# Patient Record
Sex: Female | Born: 1995 | Race: Black or African American | Hispanic: No | Smoking: Never smoker
Health system: Southern US, Community
[De-identification: ages and names within clinical notes are randomized; demographics above are authoritative.]

## PROBLEM LIST (undated history)

## (undated) ENCOUNTER — Inpatient Hospital Stay (HOSPITAL_COMMUNITY): Payer: Self-pay

## (undated) DIAGNOSIS — I10 Essential (primary) hypertension: Secondary | ICD-10-CM

## (undated) DIAGNOSIS — Z8751 Personal history of pre-term labor: Secondary | ICD-10-CM

## (undated) DIAGNOSIS — O1410 Severe pre-eclampsia, unspecified trimester: Secondary | ICD-10-CM

## (undated) DIAGNOSIS — Z9851 Tubal ligation status: Secondary | ICD-10-CM

## (undated) DIAGNOSIS — Z98891 History of uterine scar from previous surgery: Secondary | ICD-10-CM

## (undated) DIAGNOSIS — D500 Iron deficiency anemia secondary to blood loss (chronic): Secondary | ICD-10-CM

## (undated) HISTORY — PX: HERNIA REPAIR: SHX51

## (undated) HISTORY — DX: Essential (primary) hypertension: I10

## (undated) HISTORY — PX: EYE SURGERY: SHX253

---
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
## 2013-01-21 ENCOUNTER — Ambulatory Visit: Payer: Managed Care, Other (non HMO) | Admitting: Internal Medicine

## 2013-04-06 ENCOUNTER — Ambulatory Visit (INDEPENDENT_AMBULATORY_CARE_PROVIDER_SITE_OTHER): Payer: Managed Care, Other (non HMO) | Admitting: Internal Medicine

## 2013-04-06 ENCOUNTER — Encounter: Payer: Self-pay | Admitting: Internal Medicine

## 2013-04-06 VITALS — BP 124/70 | HR 69 | Temp 98.0°F | Resp 16 | Ht 66.5 in | Wt 154.2 lb

## 2013-04-06 DIAGNOSIS — I10 Essential (primary) hypertension: Secondary | ICD-10-CM

## 2013-04-06 DIAGNOSIS — S0512XA Contusion of eyeball and orbital tissues, left eye, initial encounter: Secondary | ICD-10-CM

## 2013-04-06 DIAGNOSIS — S0510XA Contusion of eyeball and orbital tissues, unspecified eye, initial encounter: Secondary | ICD-10-CM
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
## 2013-07-27 ENCOUNTER — Inpatient Hospital Stay (HOSPITAL_COMMUNITY)
Admission: AD | Admit: 2013-07-27 | Discharge: 2013-07-27 | Disposition: A | Discharge status: Home / Self Care | Payer: Managed Care, Other (non HMO) | Source: Ambulatory Visit | Attending: Obstetrics & Gynecology | Admitting: Obstetrics & Gynecology

## 2013-07-27 ENCOUNTER — Encounter (HOSPITAL_COMMUNITY): Payer: Self-pay

## 2013-07-27 ENCOUNTER — Ambulatory Visit: Payer: Managed Care, Other (non HMO)

## 2013-07-27 DIAGNOSIS — M545 Low back pain, unspecified: Secondary | ICD-10-CM | POA: Insufficient documentation

## 2013-07-27 DIAGNOSIS — I10 Essential (primary) hypertension: Secondary | ICD-10-CM | POA: Insufficient documentation

## 2013-07-27 DIAGNOSIS — N926 Irregular menstruation, unspecified: Secondary | ICD-10-CM | POA: Insufficient documentation

## 2013-07-27 DIAGNOSIS — N946 Dysmenorrhea, unspecified: Secondary | ICD-10-CM

## 2013-07-27 LAB — CBC
MCH: 25.1 pg — ABNORMAL LOW (ref 26.0–34.0)
MCV: 76.8 fL — ABNORMAL LOW (ref 78.0–100.0)
Platelets: 316 10*3/uL (ref 150–400)
RBC: 4.35 MIL/uL (ref 3.87–5.11)
RDW: 14.3 % (ref 11.5–15.5)
WBC: 4.2 10*3/uL (ref 4.0–10.5)

## 2013-07-27 LAB — URINALYSIS, ROUTINE W REFLEX MICROSCOPIC
Bilirubin Urine: NEGATIVE
Leukocytes, UA: NEGATIVE
Nitrite: NEGATIVE
Specific Gravity, Urine: 1.02 (ref 1.005–1.030)
Urobilinogen, UA: 0.2 mg/dL (ref 0.0–1.0)
pH: 6.5 (ref 5.0–8.0)

## 2013-07-27 LAB — WET PREP, GENITAL
Clue Cells Wet Prep HPF POC: NONE SEEN
Yeast Wet Prep HPF POC: NONE SEEN

## 2013-07-27 LAB — URINE MICROSCOPIC-ADD ON

## 2013-07-27 NOTE — MAU Note (Signed)
Patient states she has not had a period since 10-14 but has not done a pregnancy test. States she passed a brown clot last night with more clots this am and started having abdominal cramping.

## 2013-07-27 NOTE — MAU Provider Note (Signed)
History     CSN: [id number]  Arrival date and time: 07/27/13 4098   First Provider Initiated Contact with Patient 07/27/13 1009      Chief Complaint  Patient presents with  . Vaginal Bleeding  . Abdominal Cramping  . Possible Pregnancy   HPI Ms. Eileen Miller is a 17 y.o. G0P0 who presents to MAU today with complaint of lower abdominal and low back pain and "heavy" vaginal bleeding since this morning. The patient states LMP was 05/19/13. She denies a history of irregular periods. She is not currently sexually active, but was in October without protection or birth control. The patient has not taken HPT. She rates her pain at 5/10 now and has not taken any pain medication. The patient has a history of HTN that is managed by Dr. Maida Sale at [facility]. She denies headache or blurred vision today. She denies dizziness, weakness, fatigue or LOC   OB History   Grav Para Term Preterm Abortions TAB SAB Ect Mult Living   0 0              Past Medical History  Diagnosis Date  . Hypertension     Past Surgical History  Procedure Laterality Date  . Eye surgery      Family History  Problem Relation Age of Onset  . Hypertension Father   . Hypertension Maternal Grandmother   . Hypertension Paternal Grandmother     History  Substance Use Topics  . Smoking status: Never Smoker   . Smokeless tobacco: Never Used  . Alcohol Use: No    Allergies: No Known Allergies  No prescriptions prior to admission    Review of Systems  Constitutional: Negative for fever and malaise/fatigue.  Gastrointestinal: Positive for abdominal pain. Negative for nausea, vomiting, diarrhea and constipation.  Genitourinary: Negative for dysuria, urgency and frequency.       + vaginal bleeding Neg - vaginal discharge  Neurological: Negative for dizziness, loss of consciousness and weakness.   Physical Exam   Blood pressure 135/98, pulse 83, temperature 99.2 F (37.3 C), temperature source Oral, resp. rate  16, height 5\' 6"  (1.676 m), weight 164 lb 3.2 oz (74.481 kg), last menstrual period 05/19/2013, SpO2 100.00%.  Physical Exam  Constitutional: She is oriented to person, place, and time. She appears well-developed and well-nourished. No distress.  HENT:  Head: Normocephalic and atraumatic.  Cardiovascular: Normal rate, regular rhythm and normal heart sounds.   Respiratory: Effort normal and breath sounds normal. No respiratory distress.  GI: Soft. Bowel sounds are normal. She exhibits no distension and no mass. There is tenderness (mild to moderate tenderness to palpation of the lower abdomen bilaterally and LUQ). There is no rebound and no guarding.  Genitourinary: Uterus is tender (mild). Uterus is not enlarged. Cervix exhibits no motion tenderness, no discharge and no friability. Right adnexum displays no mass and no tenderness. Left adnexum displays no mass and no tenderness. There is bleeding (small amount of blood in the vaginal vault) around the vagina. No vaginal discharge found.  Neurological: She is alert and oriented to person, place, and time.  Skin: Skin is warm and dry. No erythema.  Psychiatric: She has a normal mood and affect.   Results for orders placed during the hospital encounter of 07/27/13 (from the past 24 hour(s))  URINALYSIS, ROUTINE W REFLEX MICROSCOPIC     Status: Abnormal   Collection Time    07/27/13  9:05 AM      Result Value Range  Color, Urine YELLOW  YELLOW   APPearance CLEAR  CLEAR   Specific Gravity, Urine 1.020  1.005 - 1.030   pH 6.5  5.0 - 8.0   Glucose, UA NEGATIVE  NEGATIVE mg/dL   Hgb urine dipstick LARGE (*) NEGATIVE   Bilirubin Urine NEGATIVE  NEGATIVE   Ketones, ur NEGATIVE  NEGATIVE mg/dL   Protein, ur NEGATIVE  NEGATIVE mg/dL   Urobilinogen, UA 0.2  0.0 - 1.0 mg/dL   Nitrite NEGATIVE  NEGATIVE   Leukocytes, UA NEGATIVE  NEGATIVE  URINE MICROSCOPIC-ADD ON     Status: Abnormal   Collection Time    07/27/13  9:05 AM      Result Value Range    Squamous Epithelial / LPF FEW (*) RARE   RBC / HPF TOO NUMEROUS TO COUNT  <3 RBC/hpf   Bacteria, UA FEW (*) RARE  POCT PREGNANCY, URINE     Status: None   Collection Time    07/27/13  9:11 AM      Result Value Range   Preg Test, Ur NEGATIVE  NEGATIVE  WET PREP, GENITAL     Status: Abnormal   Collection Time    07/27/13 10:23 AM      Result Value Range   Yeast Wet Prep HPF POC NONE SEEN  NONE SEEN   Trich, Wet Prep NONE SEEN  NONE SEEN   Clue Cells Wet Prep HPF POC NONE SEEN  NONE SEEN   WBC, Wet Prep HPF POC FEW (*) NONE SEEN  CBC     Status: Abnormal   Collection Time    07/27/13 10:25 AM      Result Value Range   WBC 4.2  4.0 - 10.5 K/uL   RBC 4.35  3.87 - 5.11 MIL/uL   Hemoglobin 10.9 (*) 12.0 - 15.0 g/dL   HCT 30.8 (*) 65.7 - 84.6 %   MCV 76.8 (*) 78.0 - 100.0 fL   MCH 25.1 (*) 26.0 - 34.0 pg   MCHC 32.6  30.0 - 36.0 g/dL   RDW 96.2  95.2 - 84.1 %   Platelets 316  150 - 400 K/uL  HCG, SERUM, QUALITATIVE     Status: None   Collection Time    07/27/13 10:25 AM      Result Value Range   Preg, Serum NEGATIVE  NEGATIVE    MAU Course  Procedures None  MDM UPT - negative UA, Wet prep, GC/Chlamydia, CBC and serum hCG today Patient is hemodynamically stable Assessment and Plan  A: Dysmenorrhea Irregular menses HTN  P: Discharge home Patient given contact information for [facility] to make appointment for follow-up if irregular menses continue Patient advised to take Ibuprofen PRN pain Patient advised to follow-up with PCP for HTN and stroke warning signs were discussed Patient may return to MAU as needed or if her condition were to change or worsen  Freddi Starr, PA-C 07/27/2013, 11:42 AM

## 2013-07-28 LAB — GC/CHLAMYDIA PROBE AMP
CT Probe RNA: NEGATIVE
GC Probe RNA: NEGATIVE

## 2013-08-06 DIAGNOSIS — O1410 Severe pre-eclampsia, unspecified trimester: Secondary | ICD-10-CM

## 2013-08-06 HISTORY — DX: Severe pre-eclampsia, unspecified trimester: O14.10

## 2013-09-09 ENCOUNTER — Encounter (HOSPITAL_COMMUNITY): Payer: Self-pay | Admitting: *Deleted

## 2013-09-09 ENCOUNTER — Inpatient Hospital Stay (HOSPITAL_COMMUNITY): Payer: Medicaid Other

## 2013-09-09 ENCOUNTER — Inpatient Hospital Stay (HOSPITAL_COMMUNITY)
Admission: AD | Admit: 2013-09-09 | Discharge: 2013-09-09 | Disposition: A | Discharge status: Home / Self Care | Payer: Medicaid Other | Source: Ambulatory Visit | Attending: Obstetrics & Gynecology | Admitting: Obstetrics & Gynecology

## 2013-09-09 DIAGNOSIS — N949 Unspecified condition associated with female genital organs and menstrual cycle: Secondary | ICD-10-CM

## 2013-09-09 DIAGNOSIS — R102 Pelvic and perineal pain: Secondary | ICD-10-CM

## 2013-09-09 DIAGNOSIS — O26899 Other specified pregnancy related conditions, unspecified trimester: Secondary | ICD-10-CM

## 2013-09-09 DIAGNOSIS — R109 Unspecified abdominal pain: Secondary | ICD-10-CM | POA: Insufficient documentation

## 2013-09-09 DIAGNOSIS — Z3201 Encounter for pregnancy test, result positive: Secondary | ICD-10-CM | POA: Insufficient documentation

## 2013-09-09 LAB — HCG, QUANTITATIVE, PREGNANCY: hCG, Beta Chain, Quant, S: 26656 m[IU]/mL — ABNORMAL HIGH (ref <5)

## 2013-09-09 LAB — CBC
HEMATOCRIT: 30.2 % — AB (ref 36.0–46.0)
Hemoglobin: 10.3 g/dL — ABNORMAL LOW (ref 12.0–15.0)
MCH: 26 pg (ref 26.0–34.0)
MCHC: 34.1 g/dL (ref 30.0–36.0)
MCV: 76.3 fL — AB (ref 78.0–100.0)
Platelets: 320 10*3/uL (ref 150–400)
RBC: 3.96 MIL/uL (ref 3.87–5.11)
RDW: 13.9 % (ref 11.5–15.5)
WBC: 5 10*3/uL (ref 4.0–10.5)

## 2013-09-09 LAB — WET PREP, GENITAL
CLUE CELLS WET PREP: NONE SEEN
Trich, Wet Prep: NONE SEEN
Yeast Wet Prep HPF POC: NONE SEEN

## 2013-09-09 LAB — ABO/RH: ABO/RH(D): O POS

## 2013-09-09 LAB — POCT PREGNANCY, URINE: Preg Test, Ur: POSITIVE — AB

## 2013-09-09 MED ORDER — LABETALOL HCL 100 MG PO TABS: 200.0000 mg | ORAL_TABLET | Freq: Two times a day (BID) | ORAL | Status: DC

## 2013-09-09 NOTE — MAU Note (Signed)
Hx of chtn, was on meds, primary had taken her off of them last June

## 2013-09-09 NOTE — Discharge Instructions (Signed)
Pregnancy - First Trimester  During sexual intercourse, millions of sperm go into the vagina. Only 1 sperm will penetrate and fertilize the female egg while it is in the Fallopian tube. One week later, the fertilized egg implants into the wall of the uterus. An embryo begins to develop into a baby. At 6 to 8 weeks, the eyes and face are formed and the heartbeat can be seen on ultrasound. At the end of 12 weeks (first trimester), all the baby's organs are formed. Now that you are pregnant, you will want to do everything you can to have a healthy baby. Two of the most important things are to get good prenatal care and follow your caregiver's instructions. Prenatal care is all the medical care you receive before the baby's birth. It is given to prevent, find, and treat problems during the pregnancy and childbirth.  PRENATAL EXAMS  · During prenatal visits, your weight, blood pressure, and urine are checked. This is done to make sure you are healthy and progressing normally during the pregnancy.  · A pregnant woman should gain 25 to 35 pounds during the pregnancy. However, if you are overweight or underweight, your caregiver will advise you regarding your weight.  · Your caregiver will ask and answer questions for you.  · Blood work, cervical cultures, other necessary tests, and a Pap test are done during your prenatal exams. These tests are done to check on your health and the probable health of your baby. Tests are strongly recommended and done for HIV with your permission. This is the virus that causes AIDS. These tests are done because medicines can be given to help prevent your baby from being born with this infection should you have been infected without knowing it. Blood work is also used to find out your blood type, previous infections, and follow your blood levels (hemoglobin).  · Low hemoglobin (anemia) is common during pregnancy. Iron and vitamins are given to help prevent this. Later in the pregnancy, blood  tests for diabetes will be done along with any other tests if any problems develop.  · You may need other tests to make sure you and the baby are doing well.  CHANGES DURING THE FIRST TRIMESTER   Your body goes through many changes during pregnancy. They vary from person to person. Talk to your caregiver about changes you notice and are concerned about. Changes can include:  · Your menstrual period stops.  · The egg and sperm carry the genes that determine what you look like. Genes from you and your partner are forming a baby. The female genes determine whether the baby is a boy or a girl.  · Your body increases in girth and you may feel bloated.  · Feeling sick to your stomach (nauseous) and throwing up (vomiting). If the vomiting is uncontrollable, call your caregiver.  · Your breasts will begin to enlarge and become tender.  · Your nipples may stick out more and become darker.  · The need to urinate more. Painful urination may mean you have a bladder infection.  · Tiring easily.  · Loss of appetite.  · Cravings for certain kinds of food.  · At first, you may gain or lose a couple of pounds.  · You may have changes in your emotions from day to day (excited to be pregnant or concerned something may go wrong with the pregnancy and baby).  · You may have more vivid and strange dreams.  HOME CARE INSTRUCTIONS   ·   It is very important to avoid all smoking, alcohol and non-prescribed drugs during your pregnancy. These affect the formation and growth of the baby. Avoid chemicals while pregnant to ensure the delivery of a healthy infant.  · Start your prenatal visits by the 12th week of pregnancy. They are usually scheduled monthly at first, then more often in the last 2 months before delivery. Keep your caregiver's appointments. Follow your caregiver's instructions regarding medicine use, blood and lab tests, exercise, and diet.  · During pregnancy, you are providing food for you and your baby. Eat regular, well-balanced  meals. Choose foods such as meat, fish, milk and other low fat dairy products, vegetables, fruits, and whole-grain breads and cereals. Your caregiver will tell you of the ideal weight gain.  · You can help morning sickness by keeping soda crackers at the bedside. Eat a couple before arising in the morning. You may want to use the crackers without salt on them.  · Eating 4 to 5 small meals rather than 3 large meals a day also may help the nausea and vomiting.  · Drinking liquids between meals instead of during meals also seems to help nausea and vomiting.  · A physical sexual relationship may be continued throughout pregnancy if there are no other problems. Problems may be early (premature) leaking of amniotic fluid from the membranes, vaginal bleeding, or belly (abdominal) pain.  · Exercise regularly if there are no restrictions. Check with your caregiver or physical therapist if you are unsure of the safety of some of your exercises. Greater weight gain will occur in the last 2 trimesters of pregnancy. Exercising will help:  · Control your weight.  · Keep you in shape.  · Prepare you for labor and delivery.  · Help you lose your pregnancy weight after you deliver your baby.  · Wear a good support or jogging bra for breast tenderness during pregnancy. This may help if worn during sleep too.  · Ask when prenatal classes are available. Begin classes when they are offered.  · Do not use hot tubs, steam rooms, or saunas.  · Wear your seat belt when driving. This protects you and your baby if you are in an accident.  · Avoid raw meat, uncooked cheese, cat litter boxes, and soil used by cats throughout the pregnancy. These carry germs that can cause birth defects in the baby.  · The first trimester is a good time to visit your dentist for your dental health. Getting your teeth cleaned is okay. Use a softer toothbrush and brush gently during pregnancy.  · Ask for help if you have financial, counseling, or nutritional needs  during pregnancy. Your caregiver will be able to offer counseling for these needs as well as refer you for other special needs.  · Do not take any medicines or herbs unless told by your caregiver.  · Inform your caregiver if there is any mental or physical domestic violence.  · Make a list of emergency phone numbers of family, friends, hospital, and police and fire departments.  · Write down your questions. Take them to your prenatal visit.  · Do not douche.  · Do not cross your legs.  · If you have to stand for long periods of time, rotate you feet or take small steps in a circle.  · You may have more vaginal secretions that may require a sanitary pad. Do not use tampons or scented sanitary pads.  MEDICINES AND DRUG USE IN PREGNANCY  ·   Take prenatal vitamins as directed. The vitamin should contain 1 milligram of folic acid. Keep all vitamins out of reach of children. Only a couple vitamins or tablets containing iron may be fatal to a baby or young child when ingested.  · Avoid use of all medicines, including herbs, over-the-counter medicines, not prescribed or suggested by your caregiver. Only take over-the-counter or prescription medicines for pain, discomfort, or fever as directed by your caregiver. Do not use aspirin, ibuprofen, or naproxen unless directed by your caregiver.  · Let your caregiver also know about herbs you may be using.  · Alcohol is related to a number of birth defects. This includes fetal alcohol syndrome. All alcohol, in any form, should be avoided completely. Smoking will cause low birth rate and premature babies.  · Street or illegal drugs are very harmful to the baby. They are absolutely forbidden. A baby born to an addicted mother will be addicted at birth. The baby will go through the same withdrawal an adult does.  · Let your caregiver know about any medicines that you have to take and for what reason you take them.  SEEK MEDICAL CARE IF:   You have any concerns or worries during your  pregnancy. It is better to call with your questions if you feel they cannot wait, rather than worry about them.  SEEK IMMEDIATE MEDICAL CARE IF:   · An unexplained oral temperature above 102° F (38.9° C) develops, or as your caregiver suggests.  · You have leaking of fluid from the vagina (birth canal). If leaking membranes are suspected, take your temperature and inform your caregiver of this when you call.  · There is vaginal spotting or bleeding. Notify your caregiver of the amount and how many pads are used.  · You develop a bad smelling vaginal discharge with a change in the color.  · You continue to feel sick to your stomach (nauseated) and have no relief from remedies suggested. You vomit blood or coffee ground-like materials.  · You lose more than 2 pounds of weight in 1 week.  · You gain more than 2 pounds of weight in 1 week and you notice swelling of your face, hands, feet, or legs.  · You gain 5 pounds or more in 1 week (even if you do not have swelling of your hands, face, legs, or feet).  · You get exposed to German measles and have never had them.  · You are exposed to fifth disease or chickenpox.  · You develop belly (abdominal) pain. Round ligament discomfort is a common non-cancerous (benign) cause of abdominal pain in pregnancy. Your caregiver still must evaluate this.  · You develop headache, fever, diarrhea, pain with urination, or shortness of breath.  · You fall or are in a car accident or have any kind of trauma.  · There is mental or physical violence in your home.  Document Released: 07/17/2001 Document Revised: 04/16/2012 Document Reviewed: 01/18/2009  [employer]® Patient Information ©2014 [employer].

## 2013-09-09 NOTE — MAU Provider Note (Signed)
History     CSN: [id number]  Arrival date and time: 09/09/13 4098   First Provider Initiated Contact with Patient 09/09/13 1916      Chief Complaint  Patient presents with  . Abdominal Cramping  . Possible Pregnancy   Abdominal Cramping  Possible Pregnancy    Eileen Miller is a 18 y.o. G1P0 at [redacted]w[redacted]d who presents today with cramping. She rates the pain 5/10. She also has a history of hypertension and her PCP took her off B/Ps last year. She denies any bleeding.   Past Medical History  Diagnosis Date  . Hypertension     Past Surgical History  Procedure Laterality Date  . Eye surgery      Family History  Problem Relation Age of Onset  . Hypertension Father   . Hypertension Maternal Grandmother   . Hypertension Paternal Grandmother     History  Substance Use Topics  . Smoking status: Never Smoker   . Smokeless tobacco: Never Used  . Alcohol Use: No    Allergies: No Known Allergies  No prescriptions prior to admission    ROS Physical Exam   Blood pressure 141/102, pulse 125, temperature 99 F (37.2 C), temperature source Oral, resp. rate 20, height 5\' 6"  (1.676 m), weight 73.936 kg (163 lb), last menstrual period 07/27/2013.  Physical Exam  Nursing note and vitals reviewed. Constitutional: She is oriented to person, place, and time. She appears well-developed and well-nourished. No distress.  Cardiovascular: Normal rate.   Respiratory: Effort normal.  GI: Soft. There is no tenderness.  Genitourinary:   External: no lesion Vagina: small amount of white discharge Cervix: pink, smooth, no CMT Uterus: NSSC Adnexa: NT   Neurological: She is alert and oriented to person, place, and time.  Skin: Skin is warm and dry.  Psychiatric: She has a normal mood and affect.    MAU Course  Procedures  Results for orders placed during the hospital encounter of 09/09/13 (from the past 24 hour(s))  POCT PREGNANCY, URINE     Status: Abnormal   Collection Time    09/09/13  6:54 PM      Result Value Range   Preg Test, Ur POSITIVE (*) NEGATIVE  CBC     Status: Abnormal   Collection Time    09/09/13  7:15 PM      Result Value Range   WBC 5.0  4.0 - 10.5 K/uL   RBC 3.96  3.87 - 5.11 MIL/uL   Hemoglobin 10.3 (*) 12.0 - 15.0 g/dL   HCT 11.9 (*) 14.7 - 82.9 %   MCV 76.3 (*) 78.0 - 100.0 fL   MCH 26.0  26.0 - 34.0 pg   MCHC 34.1  30.0 - 36.0 g/dL   RDW 56.2  13.0 - 86.5 %   Platelets 320  150 - 400 K/uL  WET PREP, GENITAL     Status: Abnormal   Collection Time    09/09/13  7:30 PM      Result Value Range   Yeast Wet Prep HPF POC NONE SEEN  NONE SEEN   Trich, Wet Prep NONE SEEN  NONE SEEN   Clue Cells Wet Prep HPF POC NONE SEEN  NONE SEEN   WBC, Wet Prep HPF POC FEW (*) NONE SEEN   US Ob Comp Less 14 Wks  09/09/2013   CLINICAL DATA:  Cramping. Clinical gestational age of [redacted] weeks and 2 days  EXAM: OBSTETRIC <14 WK [place] AND TRANSVAGINAL OB US  TECHNIQUE: Both transabdominal and  transvaginal ultrasound examinations were performed for complete evaluation of the gestation as well as the maternal uterus, adnexal regions, and pelvic cul-de-sac. Transvaginal technique was performed to assess early pregnancy.  COMPARISON:  None.  FINDINGS: Intrauterine gestational sac: Visualized/normal in shape.  Yolk sac:  Yes  Embryo:  Yes  Cardiac Activity: Yes  Heart Rate:  126 bpm  CRL:   3.9  mm   6 w  d                  US EDC: 05/04/2014  Maternal uterus/adnexae:  Subchorionic hemorrhage: None  Right ovary: Normal  Left ovary: Normal  Other :None  Free fluid:  Small  IMPRESSION: 1. Single living intrauterine gestation. The estimated gestational age is 6 weeks and 1 day which is concordant with the clinical gestational age. 2. Small amount of free fluid noted within the pelvis.   Electronically Signed   By: Signa Kellaylor  Stroud M.D.   On: 09/09/2013 19:56   [place] Ob Transvaginal  09/09/2013   CLINICAL DATA:  Cramping. Clinical gestational age of [redacted] weeks and 2 days  EXAM: OBSTETRIC <14  WK US AND TRANSVAGINAL OB US  TECHNIQUE: Both transabdominal and transvaginal ultrasound examinations were performed for complete evaluation of the gestation as well as the maternal uterus, adnexal regions, and pelvic cul-de-sac. Transvaginal technique was performed to assess early pregnancy.  COMPARISON:  None.  FINDINGS: Intrauterine gestational sac: Visualized/normal in shape.  Yolk sac:  Yes  Embryo:  Yes  Cardiac Activity: Yes  Heart Rate:  126 bpm  CRL:   3.9  mm   6 w  d                  US EDC: 05/04/2014  Maternal uterus/adnexae:  Subchorionic hemorrhage: None  Right ovary: Normal  Left ovary: Normal  Other :None  Free fluid:  Small  IMPRESSION: 1. Single living intrauterine gestation. The estimated gestational age is 6 weeks and 1 day which is concordant with the clinical gestational age. 2. Small amount of free fluid noted within the pelvis.   Electronically Signed   By: Signa Kellaylor  Stroud M.D.   On: 09/09/2013 19:56   2000: D/W Dr. Despina HiddenEure. Will start on Labetalol 200mg  BID at this time.  Assessment and Plan   1. Pelvic pain complicating pregnancy   Viable IUP on US today First trimester precautions reviewed [facility].   Tawnya CrookHogan, Heather Donovan 09/09/2013, 7:17 PM

## 2013-09-09 NOTE — MAU Note (Signed)
preg confirmed at preg center in [place] last Friday.  Started cramping this morning.

## 2013-09-10 LAB — GC/CHLAMYDIA PROBE AMP
CT Probe RNA: NEGATIVE
GC PROBE AMP APTIMA: NEGATIVE

## 2014-04-01 DIAGNOSIS — O1413 Severe pre-eclampsia, third trimester: Secondary | ICD-10-CM

## 2014-06-07 ENCOUNTER — Encounter (HOSPITAL_COMMUNITY): Payer: Self-pay | Admitting: *Deleted

## 2014-07-15 ENCOUNTER — Encounter (HOSPITAL_COMMUNITY): Payer: Self-pay | Admitting: *Deleted

## 2015-08-31 ENCOUNTER — Ambulatory Visit: Payer: Managed Care, Other (non HMO) | Admitting: Family Medicine

## 2015-10-11 ENCOUNTER — Encounter (HOSPITAL_COMMUNITY): Payer: Self-pay

## 2015-10-11 ENCOUNTER — Emergency Department (HOSPITAL_COMMUNITY)
Admission: EM | Admit: 2015-10-11 | Discharge: 2015-10-11 | Disposition: A | Discharge status: Home / Self Care | Payer: 59 | Attending: Emergency Medicine | Admitting: Emergency Medicine

## 2015-10-11 DIAGNOSIS — Z3202 Encounter for pregnancy test, result negative: Secondary | ICD-10-CM | POA: Insufficient documentation

## 2015-10-11 DIAGNOSIS — H539 Unspecified visual disturbance: Secondary | ICD-10-CM | POA: Diagnosis not present

## 2015-10-11 DIAGNOSIS — R002 Palpitations: Secondary | ICD-10-CM | POA: Diagnosis present

## 2015-10-11 DIAGNOSIS — I10 Essential (primary) hypertension: Secondary | ICD-10-CM | POA: Diagnosis not present

## 2015-10-11 LAB — POC URINE PREG, ED: Preg Test, Ur: NEGATIVE

## 2015-10-11 MED ORDER — LABETALOL HCL 100 MG PO TABS: 200.0000 mg | ORAL_TABLET | Freq: Two times a day (BID) | ORAL | Status: DC

## 2015-10-11 NOTE — ED Notes (Addendum)
Pt is out of her bp meds x a while.  Pt states 5 months. Pt states she knows it is high.  Pt thinks her bp may be high b/c she could be pregnant.  Has not tested.  Did have some spotting last week but was very light which is abnormal for her.

## 2015-10-11 NOTE — ED Provider Notes (Signed)
CSN: [id number]     Arrival date & time 10/11/15  1807 History   By signing my name below, I, Iona Beard, attest that this documentation has been prepared under the direction and in the presence of [employer], PA-C.  Electronically Signed: Iona Beard, ED Scribe 10/11/2015 at 8:51 PM.    Chief Complaint  Patient presents with  . Hypertension    The history is provided by the patient. No language interpreter was used.   HPI Comments Eileen Miller is a 20 y.o. female who presents to the Emergency Department complaining of gradual onset, constant hypertension, ongoing for about four months. Pt states that she has been out of her blood pressure medication since November. Pt reports associated blurry vision, tingling in her left arm, swelling in her tongue, and heart palpitations which are sporadic. She denies any of these symptoms currently. She states she took her BP reading about a month ago and it was 150/60. Pt denies syncope, chest pain, or any other pertinent symptoms. Pt was on labetalol in the past. Pt also states that she may be pregnant. LMP was in January. She is not on birth control.     Past Medical History  Diagnosis Date  . Hypertension    Past Surgical History  Procedure Laterality Date  . Eye surgery     Family History  Problem Relation Age of Onset  . Hypertension Father   . Hypertension Maternal Grandmother   . Hypertension Paternal Grandmother    Social History  Substance Use Topics  . Smoking status: Never Smoker   . Smokeless tobacco: Never Used  . Alcohol Use: No   OB History    Gravida Para Term Preterm AB TAB SAB Ectopic Multiple Living   1 0              Review of Systems  Eyes: Positive for visual disturbance.  Cardiovascular: Positive for palpitations.  All other systems reviewed and are negative. A complete 10 system review of systems was obtained and all systems are negative except as noted in the HPI and PMH.    Allergies   Review of patient's allergies indicates no known allergies.  Home Medications   Prior to Admission medications   Medication Sig Start Date End Date Taking? Authorizing Provider  labetalol (NORMODYNE) 100 MG tablet Take 2 tablets (200 mg total) by mouth 2 (two) times daily. 10/11/15   Antony Madura, PA-C   BP 165/107 mmHg  Pulse 83  Temp(Src) 98.2 F (36.8 C) (Oral)  Resp 16  SpO2 100%  LMP 09/06/2015  Physical Exam  Constitutional: She is oriented to person, place, and time. She appears well-developed and well-nourished. No distress.  Nontoxic/nonseptic appearing  HENT:  Head: Normocephalic and atraumatic.  No angioedema. Patient tolerating secretions without difficulty  Eyes: Conjunctivae and EOM are normal. No scleral icterus.  Neck: Normal range of motion.  Cardiovascular: Normal rate, regular rhythm and intact distal pulses.   Pulmonary/Chest: Effort normal. No respiratory distress. She has no wheezes. She has no rales.  Respirations even and unlabored. Lungs clear to auscultation bilaterally.  Musculoskeletal: Normal range of motion.  Neurological: She is alert and oriented to person, place, and time. She exhibits normal muscle tone. Coordination normal.  Skin: Skin is warm and dry. No rash noted. She is not diaphoretic. No erythema. No pallor.  Psychiatric: She has a normal mood and affect. Her behavior is normal.  Nursing note and vitals reviewed.   ED Course  Procedures (including  critical care time) DIAGNOSTIC STUDIES: Oxygen Saturation is 100% on RA, normal by my interpretation.    COORDINATION OF CARE: 8:40 PM-Discussed treatment plan which includes pregnancy test and EKG with pt at bedside and pt agreed to plan.   Labs Review Labs Reviewed  POC URINE PREG, ED    Imaging Review No results found.   I have personally reviewed and evaluated these lab results as part of my medical decision-making.   EKG Interpretation   Date/Time:  Tuesday October 11 2015  21:11:50 EST Ventricular Rate:  68 PR Interval:  138 QRS Duration: 88 QT Interval:  413 QTC Calculation: 439 R Axis:   75 Text Interpretation:  Sinus rhythm Probable left atrial enlargement  Nonspecific T abnormalities, diffuse leads Confirmed by Lincoln Brighamees, Liz [phone]([id number])  on 10/11/2015 9:20:03 PM      MDM   Final diagnoses:  Essential hypertension    20 year old female with a history of hypertension presents to the emergency department for high blood pressure. She is well and nontoxic appearing with an unremarkable physical exam. EKG negative for acute ischemic changes. Will restart the patient's labetalol and have her follow-up with her primary care doctor for a blood pressure recheck. Return precautions discussed and provided. Patient discharged in good condition with no unaddressed concerns.  I personally performed the services described in this documentation, which was scribed in my presence. The recorded information has been reviewed and is accurate.    Filed Vitals:   10/11/15 1840  BP: 165/107  Pulse: 83  Temp: 98.2 F (36.8 C)  TempSrc: Oral  Resp: 16  SpO2: 100%        Antony MaduraKelly Jacorey Donaway, PA-C 10/11/15 2123  Tilden FossaElizabeth Rees, MD 10/14/15 1019

## 2015-10-11 NOTE — Discharge Instructions (Signed)
Hypertension Hypertension, commonly called high blood pressure, is when the force of blood pumping through your arteries is too strong. Your arteries are the blood vessels that carry blood from your heart throughout your body. A blood pressure reading consists of a higher number over a lower number, such as 110/72. The higher number (systolic) is the pressure inside your arteries when your heart pumps. The lower number (diastolic) is the pressure inside your arteries when your heart relaxes. Ideally you want your blood pressure below 120/80. Hypertension forces your heart to work harder to pump blood. Your arteries may become narrow or stiff. Having untreated or uncontrolled hypertension can cause heart attack, stroke, kidney disease, and other problems. RISK FACTORS Some risk factors for high blood pressure are controllable. Others are not.  Risk factors you cannot control include:   Race. You may be at higher risk if you are African American.  Age. Risk increases with age.  Gender. Men are at higher risk than women before age 45 years. After age 65, women are at higher risk than men. Risk factors you can control include:  Not getting enough exercise or physical activity.  Being overweight.  Getting too much fat, sugar, calories, or salt in your diet.  Drinking too much alcohol. SIGNS AND SYMPTOMS Hypertension does not usually cause signs or symptoms. Extremely high blood pressure (hypertensive crisis) may cause headache, anxiety, shortness of breath, and nosebleed. DIAGNOSIS To check if you have hypertension, your health care provider will measure your blood pressure while you are seated, with your arm held at the level of your heart. It should be measured at least twice using the same arm. Certain conditions can cause a difference in blood pressure between your right and left arms. A blood pressure reading that is higher than normal on one occasion does not mean that you need treatment. If  it is not clear whether you have high blood pressure, you may be asked to return on a different day to have your blood pressure checked again. Or, you may be asked to monitor your blood pressure at home for 1 or more weeks. TREATMENT Treating high blood pressure includes making lifestyle changes and possibly taking medicine. Living a healthy lifestyle can help lower high blood pressure. You may need to change some of your habits. Lifestyle changes may include:  Following the DASH diet. This diet is high in fruits, vegetables, and whole grains. It is low in salt, red meat, and added sugars.  Keep your sodium intake below 2,300 mg per day.  Getting at least 30-45 minutes of aerobic exercise at least 4 times per week.  Losing weight if necessary.  Not smoking.  Limiting alcoholic beverages.  Learning ways to reduce stress. Your health care provider may prescribe medicine if lifestyle changes are not enough to get your blood pressure under control, and if one of the following is true:  You are 18-59 years of age and your systolic blood pressure is above 140.  You are 60 years of age or older, and your systolic blood pressure is above 150.  Your diastolic blood pressure is above 90.  You have diabetes, and your systolic blood pressure is over 140 or your diastolic blood pressure is over 90.  You have kidney disease and your blood pressure is above 140/90.  You have heart disease and your blood pressure is above 140/90. Your personal target blood pressure may vary depending on your medical conditions, your age, and other factors. HOME CARE INSTRUCTIONS    Have your blood pressure rechecked as directed by your health care provider.   Take medicines only as directed by your health care provider. Follow the directions carefully. Blood pressure medicines must be taken as prescribed. The medicine does not work as well when you skip doses. Skipping doses also puts you at risk for  problems.  Do not smoke.   Monitor your blood pressure at home as directed by your health care provider. SEEK MEDICAL CARE IF:   You think you are having a reaction to medicines taken.  You have recurrent headaches or feel dizzy.  You have swelling in your ankles.  You have trouble with your vision. SEEK IMMEDIATE MEDICAL CARE IF:  You develop a severe headache or confusion.  You have unusual weakness, numbness, or feel faint.  You have severe chest or abdominal pain.  You vomit repeatedly.  You have trouble breathing. MAKE SURE YOU:   Understand these instructions.  Will watch your condition.  Will get help right away if you are not doing well or get worse.   This information is not intended to replace advice given to you by your health care provider. Make sure you discuss any questions you have with your health care provider.   Document Released: 07/23/2005 Document Revised: 12/07/2014 Document Reviewed: 05/15/2013 Elsevier Interactive Patient Education 2016 Elsevier Inc.  

## 2015-10-19 ENCOUNTER — Ambulatory Visit (INDEPENDENT_AMBULATORY_CARE_PROVIDER_SITE_OTHER): Payer: 59 | Admitting: Physician Assistant

## 2015-10-19 VITALS — BP 144/90 | HR 101 | Temp 99.2°F | Resp 20 | Ht 66.0 in | Wt 170.2 lb

## 2015-10-19 DIAGNOSIS — R6883 Chills (without fever): Secondary | ICD-10-CM | POA: Diagnosis not present

## 2015-10-19 DIAGNOSIS — I10 Essential (primary) hypertension: Secondary | ICD-10-CM

## 2015-10-19 DIAGNOSIS — R52 Pain, unspecified: Secondary | ICD-10-CM

## 2015-10-19 LAB — POCT INFLUENZA A/B
Influenza A, POC: NEGATIVE
Influenza B, POC: NEGATIVE

## 2015-10-19 NOTE — Progress Notes (Signed)
   Subjective:    Patient ID: Eileen Miller, female    DOB: 12/08/1995, 20 y.o.   MRN: [id number]  Chief Complaint  Patient presents with  . Generalized Body Aches    x 3 days  . Cough    yellow phlegm  . Chills  . Headache  . Hypertension   Medications, allergies, past medical history, surgical history, family history, social history and problem list reviewed and updated.  HPI  [age] yof presents with above complaints.  Symptoms started fairly suddenly 2 days ago with nausea, fatigue and chills. Body aches past 2 days. Mild non prod cough past 2 days. Rhinorrhea today. Mild sore throat yest and today. Mild frontal headache yest and today which is improved today.   Did not receive flu vaccine this year. Had chills last night, did not check temp.   Elevated bp noted. Pt has htn early htn and has been on labetalol.   Review of Systems See HPI     Objective:   Physical Exam  Constitutional: She is oriented to person, place, and time. She appears well-developed and well-nourished.  Non-toxic appearance. She does not have a sickly appearance. She does not appear ill. No distress.  BP 144/90 mmHg  Pulse 101  Temp(Src) 99.2 F (37.3 C) (Oral)  Resp 20  Ht 5\' 6"  (1.676 m)  Wt 170 lb 3.2 oz (77.202 kg)  BMI 27.48 kg/m2  SpO2 96%  LMP 08/09/2015   HENT:  Right Ear: Tympanic membrane normal.  Left Ear: Tympanic membrane normal.  Nose: Mucosal edema and rhinorrhea present. Right sinus exhibits no maxillary sinus tenderness and no frontal sinus tenderness. Left sinus exhibits no maxillary sinus tenderness and no frontal sinus tenderness.  Mouth/Throat: Uvula is midline, oropharynx is clear and moist and mucous membranes are normal.  Neck: No Brudzinski's sign noted.  Pulmonary/Chest: Effort normal and breath sounds normal.  Lymphadenopathy:       Head (right side): No submental, no submandibular and no tonsillar adenopathy present.       Head (left side): No submental, no  submandibular and no tonsillar adenopathy present.    She has no cervical adenopathy.  Neurological: She is alert and oriented to person, place, and time.   Results for orders placed or performed in visit on 10/19/15  POCT Influenza A/B  Result Value Ref Range   Influenza A, POC Negative Negative   Influenza B, POC Negative Negative      Assessment & Plan:   Body aches - Plan: POCT Influenza A/B  Chills (without fever) - Plan: POCT Influenza A/B  Essential hypertension --flu swab negative --doubt bacterial etiology with benign exam, normal vitals, relatively recent onset of symptoms --rest, fluids, tylenol prn --rtc if no improvement 3-4 days --continue labetalol for bp, pt encouraged to f/u with pcp when script runs out as she is not preg and no current intentions, may improve with switch to diff bp agent  Donnajean Lopesodd M. Dinh Ayotte, PA-C Physician Assistant-Certified [facility]  10/19/2015 3:46 PM

## 2015-10-19 NOTE — Patient Instructions (Addendum)
Your flu swab was negative.  You likely have a viral illness causing your symptoms which should pass in the next few days. Tylenol every 6 hours as needed, rest, and plenty of fluids over the next few days. Be sure to come back to see us in one month to look into the blood pressure a bit further.

## 2015-12-23 ENCOUNTER — Emergency Department (HOSPITAL_COMMUNITY)
Admission: EM | Admit: 2015-12-23 | Discharge: 2015-12-23 | Disposition: A | Discharge status: Home / Self Care | Payer: 59 | Attending: Emergency Medicine | Admitting: Emergency Medicine

## 2015-12-23 ENCOUNTER — Encounter (HOSPITAL_COMMUNITY): Payer: Self-pay | Admitting: Emergency Medicine

## 2015-12-23 DIAGNOSIS — I10 Essential (primary) hypertension: Secondary | ICD-10-CM | POA: Insufficient documentation

## 2015-12-23 DIAGNOSIS — Z3202 Encounter for pregnancy test, result negative: Secondary | ICD-10-CM | POA: Insufficient documentation

## 2015-12-23 DIAGNOSIS — Z79899 Other long term (current) drug therapy: Secondary | ICD-10-CM | POA: Insufficient documentation

## 2015-12-23 DIAGNOSIS — N760 Acute vaginitis: Secondary | ICD-10-CM | POA: Insufficient documentation

## 2015-12-23 DIAGNOSIS — B9689 Other specified bacterial agents as the cause of diseases classified elsewhere: Secondary | ICD-10-CM

## 2015-12-23 LAB — COMPREHENSIVE METABOLIC PANEL
ALK PHOS: 46 U/L (ref 38–126)
ALT: 12 U/L — ABNORMAL LOW (ref 14–54)
ANION GAP: 7 (ref 5–15)
AST: 17 U/L (ref 15–41)
Albumin: 3.6 g/dL (ref 3.5–5.0)
BUN: 12 mg/dL (ref 6–20)
CALCIUM: 9.1 mg/dL (ref 8.9–10.3)
CO2: 24 mmol/L (ref 22–32)
Chloride: 107 mmol/L (ref 101–111)
Creatinine, Ser: 0.76 mg/dL (ref 0.44–1.00)
GFR calc non Af Amer: >60 mL/min (ref >60)
Glucose, Bld: 87 mg/dL (ref 65–99)
Potassium: 3.7 mmol/L (ref 3.5–5.1)
SODIUM: 138 mmol/L (ref 135–145)
TOTAL PROTEIN: 7.1 g/dL (ref 6.5–8.1)
Total Bilirubin: 0.6 mg/dL (ref 0.3–1.2)

## 2015-12-23 LAB — CBC
HCT: 31.9 % — ABNORMAL LOW (ref 36.0–46.0)
HEMOGLOBIN: 9.7 g/dL — AB (ref 12.0–15.0)
MCH: 22.8 pg — AB (ref 26.0–34.0)
MCHC: 30.4 g/dL (ref 30.0–36.0)
MCV: 74.9 fL — ABNORMAL LOW (ref 78.0–100.0)
Platelets: 272 10*3/uL (ref 150–400)
RBC: 4.26 MIL/uL (ref 3.87–5.11)
RDW: 15.9 % — ABNORMAL HIGH (ref 11.5–15.5)
WBC: 3.3 10*3/uL — ABNORMAL LOW (ref 4.0–10.5)

## 2015-12-23 LAB — WET PREP, GENITAL
Sperm: NONE SEEN
TRICH WET PREP: NONE SEEN
YEAST WET PREP: NONE SEEN

## 2015-12-23 LAB — PREGNANCY, URINE: Preg Test, Ur: NEGATIVE

## 2015-12-23 LAB — URINALYSIS, ROUTINE W REFLEX MICROSCOPIC
BILIRUBIN URINE: NEGATIVE
GLUCOSE, UA: NEGATIVE mg/dL
HGB URINE DIPSTICK: NEGATIVE
KETONES UR: NEGATIVE mg/dL
LEUKOCYTES UA: NEGATIVE
Nitrite: NEGATIVE
PROTEIN: NEGATIVE mg/dL
Specific Gravity, Urine: 1.027 (ref 1.005–1.030)
pH: 6 (ref 5.0–8.0)

## 2015-12-23 LAB — LIPASE, BLOOD: Lipase: 18 U/L (ref 11–51)

## 2015-12-23 LAB — GC/CHLAMYDIA PROBE AMP (~~LOC~~) NOT AT ARMC
CHLAMYDIA, DNA PROBE: NEGATIVE
NEISSERIA GONORRHEA: NEGATIVE

## 2015-12-23 MED ORDER — METRONIDAZOLE 500 MG PO TABS: 500.0000 mg | ORAL_TABLET | Freq: Two times a day (BID) | ORAL | Status: DC

## 2015-12-23 NOTE — Discharge Instructions (Signed)
Follow up with your family doc. If you test + for an STD someone will give you a call.  This usually takes a couple days.   Take 4 over the counter ibuprofen tablets 3 times a day or 2 over-the-counter naproxen tablets twice a day for pain.   Bacterial Vaginosis Bacterial vaginosis is a vaginal infection that occurs when the normal balance of bacteria in the vagina is disrupted. It results from an overgrowth of certain bacteria. This is the most common vaginal infection in women of childbearing age. Treatment is important to prevent complications, especially in pregnant women, as it can cause a premature delivery. CAUSES  Bacterial vaginosis is caused by an increase in harmful bacteria that are normally present in smaller amounts in the vagina. Several different kinds of bacteria can cause bacterial vaginosis. However, the reason that the condition develops is not fully understood. RISK FACTORS Certain activities or behaviors can put you at an increased risk of developing bacterial vaginosis, including:  Having a new sex partner or multiple sex partners.  Douching.  Using an intrauterine device (IUD) for contraception. Women do not get bacterial vaginosis from toilet seats, bedding, swimming pools, or contact with objects around them. SIGNS AND SYMPTOMS  Some women with bacterial vaginosis have no signs or symptoms. Common symptoms include:  Grey vaginal discharge.  A fishlike odor with discharge, especially after sexual intercourse.  Itching or burning of the vagina and vulva.  Burning or pain with urination. DIAGNOSIS  Your health care provider will take a medical history and examine the vagina for signs of bacterial vaginosis. A sample of vaginal fluid may be taken. Your health care provider will look at this sample under a microscope to check for bacteria and abnormal cells. A vaginal pH test may also be done.  TREATMENT  Bacterial vaginosis may be treated with antibiotic  medicines. These may be given in the form of a pill or a vaginal cream. A second round of antibiotics may be prescribed if the condition comes back after treatment. Because bacterial vaginosis increases your risk for sexually transmitted diseases, getting treated can help reduce your risk for chlamydia, gonorrhea, HIV, and herpes. HOME CARE INSTRUCTIONS   Only take over-the-counter or prescription medicines as directed by your health care provider.  If antibiotic medicine was prescribed, take it as directed. Make sure you finish it even if you start to feel better.  Tell all sexual partners that you have a vaginal infection. They should see their health care provider and be treated if they have problems, such as a mild rash or itching.  During treatment, it is important that you follow these instructions:  Avoid sexual activity or use condoms correctly.  Do not douche.  Avoid alcohol as directed by your health care provider.  Avoid breastfeeding as directed by your health care provider. SEEK MEDICAL CARE IF:   Your symptoms are not improving after 3 days of treatment.  You have increased discharge or pain.  You have a fever. MAKE SURE YOU:   Understand these instructions.  Will watch your condition.  Will get help right away if you are not doing well or get worse. FOR MORE INFORMATION  Centers for Disease Control and Prevention, Division of STD Prevention: SolutionApps.co.zawww.cdc.gov/std American Sexual Health Association (ASHA): www.ashastd.org    This information is not intended to replace advice given to you by your health care provider. Make sure you discuss any questions you have with your health care provider.   Document Released:  07/23/2005 Document Revised: 08/13/2014 Document Reviewed: 03/04/2013 Elsevier Interactive Patient Education [employer]

## 2015-12-23 NOTE — ED Provider Notes (Signed)
CSN: [id number]     Arrival date & time 12/23/15  [id number] History   First MD Initiated Contact with Patient 12/23/15 [phone]     Chief Complaint  Patient presents with  . Abdominal Pain     (Consider location/radiation/quality/duration/timing/severity/associated sxs/prior Treatment) Patient is a 20 y.o. female presenting with abdominal pain. The history is provided by the patient.  Abdominal Pain Pain location:  Suprapubic Pain quality: aching and cramping   Pain radiates to:  Does not radiate Pain severity:  Moderate Onset quality:  Gradual Duration:  2 days Timing:  Constant Progression:  Worsening Chronicity:  New Relieved by:  Nothing Worsened by:  Nothing tried Ineffective treatments:  None tried Associated symptoms: no chest pain, no chills, no dysuria, no fever, no nausea, no shortness of breath and no vomiting    20 yo F With a chief complaint of lower abdominal pain. This feels like her prior menstrual cramps. Started about 3 or 4 days ago but has not improved. Having just mild spotting with this. Patient also had one bowel movement this morning that she felt like was bloody. Did eat spaghetti with tomato sauce last night. Denies fevers or chills denies nausea or vomiting. Is concerned that she may have been exposed to sexual transmitted disease.  Past Medical History  Diagnosis Date  . Hypertension    Past Surgical History  Procedure Laterality Date  . Eye surgery    . Hernia repair     Family History  Problem Relation Age of Onset  . Hypertension Father   . Hypertension Maternal Grandmother   . Hypertension Paternal Grandmother    Social History  Substance Use Topics  . Smoking status: Never Smoker   . Smokeless tobacco: Never Used  . Alcohol Use: Yes     Comment: social   OB History    Gravida Para Term Preterm AB TAB SAB Ectopic Multiple Living   1 0             Review of Systems  Constitutional: Negative for fever and chills.  HENT: Negative for  congestion and rhinorrhea.   Eyes: Negative for redness and visual disturbance.  Respiratory: Negative for shortness of breath and wheezing.   Cardiovascular: Negative for chest pain and palpitations.  Gastrointestinal: Positive for abdominal pain and blood in stool. Negative for nausea and vomiting.  Genitourinary: Negative for dysuria and urgency.  Musculoskeletal: Negative for myalgias and arthralgias.  Skin: Negative for pallor and wound.  Neurological: Negative for dizziness and headaches.      Allergies  Review of patient's allergies indicates no known allergies.  Home Medications   Prior to Admission medications   Medication Sig Start Date End Date Taking? Authorizing Provider  labetalol (NORMODYNE) 100 MG tablet Take 2 tablets (200 mg total) by mouth 2 (two) times daily. 10/11/15  Yes Antony MaduraKelly Humes, PA-C  metroNIDAZOLE (FLAGYL) 500 MG tablet Take 1 tablet (500 mg total) by mouth 2 (two) times daily. 12/23/15   Melene Planan Estefana Taylor, DO   BP 164/111 mmHg  Pulse 63  Temp(Src) 98.9 F (37.2 C) (Oral)  Resp 18  Ht 5\' 6"  (1.676 m)  Wt 175 lb (79.379 kg)  BMI 28.26 kg/m2  SpO2 100%  LMP 12/05/2015 Physical Exam  Constitutional: She is oriented to person, place, and time. She appears well-developed and well-nourished. No distress.  HENT:  Head: Normocephalic and atraumatic.  Eyes: EOM are normal. Pupils are equal, round, and reactive to light.  Neck: Normal range of motion. Neck  supple.  Cardiovascular: Normal rate and regular rhythm.  Exam reveals no gallop and no friction rub.   No murmur heard. Pulmonary/Chest: Effort normal. She has no wheezes. She has no rales.  Abdominal: Soft. She exhibits no distension. There is no tenderness. There is no rebound and no guarding.  Genitourinary: Cervix exhibits discharge. Cervix exhibits no motion tenderness. Right adnexum displays no mass and no tenderness. Left adnexum displays no mass and no tenderness.  Rectal exam with no blood or dark stool   Musculoskeletal: She exhibits no edema or tenderness.  Neurological: She is alert and oriented to person, place, and time.  Skin: Skin is warm and dry. She is not diaphoretic.  Psychiatric: She has a normal mood and affect. Her behavior is normal.  Nursing note and vitals reviewed.   ED Course  Procedures (including critical care time) Labs Review Labs Reviewed  WET PREP, GENITAL - Abnormal; Notable for the following:    Clue Cells Wet Prep HPF POC PRESENT (*)    WBC, Wet Prep HPF POC FEW (*)    All other components within normal limits  COMPREHENSIVE METABOLIC PANEL - Abnormal; Notable for the following:    ALT 12 (*)    All other components within normal limits  CBC - Abnormal; Notable for the following:    WBC 3.3 (*)    Hemoglobin 9.7 (*)    HCT 31.9 (*)    MCV 74.9 (*)    MCH 22.8 (*)    RDW 15.9 (*)    All other components within normal limits  URINALYSIS, ROUTINE W REFLEX MICROSCOPIC (NOT AT [facility])  LIPASE, BLOOD  PREGNANCY, URINE  RPR  HIV ANTIBODY (ROUTINE TESTING)  POC URINE PREG, ED  GC/CHLAMYDIA PROBE AMP ([place]) NOT AT [facility]    Imaging Review No results found. I have personally reviewed and evaluated these images and lab results as part of my medical decision-making.   EKG Interpretation None      MDM   Final diagnoses:  BV (bacterial vaginosis)    20 yo F with a chief complaint of crampy lower abdominal pain. Benign abdominal exam. Pelvic exam with diffuse discharge. Found to  have BV. Will treat.   3:52 PM:  I have discussed the diagnosis/risks/treatment options with the patient and believe the pt to be eligible for discharge home to follow-up with PCP. We also discussed returning to the ED immediately if new or worsening sx occur. We discussed the sx which are most concerning (e.g., sudden worsening pain, fever, inability to tolerate by mouth) that necessitate immediate return. Medications administered to the patient during their visit and any  new prescriptions provided to the patient are listed below.  Medications given during this visit Medications - No data to display  Discharge Medication List as of 12/23/2015 11:07 AM    START taking these medications   Details  metroNIDAZOLE (FLAGYL) 500 MG tablet Take 1 tablet (500 mg total) by mouth 2 (two) times daily., Starting 12/23/2015, Until Discontinued, Print        The patient appears reasonably screen and/or stabilized for discharge and I doubt any other medical condition or other [facility] requiring further screening, evaluation, or treatment in the ED at this time prior to discharge.       Melene Plan, DO 12/23/15 1552

## 2015-12-23 NOTE — ED Notes (Signed)
PT reports abdominal cramping for 3-4 days. PT reports she is experiencing frequent urination for a few days without pain, but she feels as though she is straining to urinate. PT also reports frequent bowel movements this week, but no diarrhea. PT reports her stool was red this morning and that concerned her. PT denies evidence that stool was bloody. PT also states, " I don't know what's going on so I'd like to be tested for STDs and everything."

## 2015-12-24 LAB — HIV ANTIBODY (ROUTINE TESTING W REFLEX): HIV Screen 4th Generation wRfx: NONREACTIVE

## 2015-12-24 LAB — RPR: RPR: NONREACTIVE

## 2016-03-03 ENCOUNTER — Inpatient Hospital Stay (HOSPITAL_COMMUNITY)
Admission: AD | Admit: 2016-03-03 | Discharge: 2016-03-03 | Disposition: A | Discharge status: Home / Self Care | Payer: 59 | Source: Ambulatory Visit | Attending: Family Medicine | Admitting: Family Medicine

## 2016-03-03 ENCOUNTER — Encounter (HOSPITAL_COMMUNITY): Payer: Self-pay | Admitting: *Deleted

## 2016-03-03 DIAGNOSIS — Z8751 Personal history of pre-term labor: Secondary | ICD-10-CM

## 2016-03-03 DIAGNOSIS — Z98891 History of uterine scar from previous surgery: Secondary | ICD-10-CM

## 2016-03-03 DIAGNOSIS — N760 Acute vaginitis: Secondary | ICD-10-CM | POA: Insufficient documentation

## 2016-03-03 DIAGNOSIS — Z3202 Encounter for pregnancy test, result negative: Secondary | ICD-10-CM

## 2016-03-03 DIAGNOSIS — N911 Secondary amenorrhea: Secondary | ICD-10-CM

## 2016-03-03 DIAGNOSIS — B373 Candidiasis of vulva and vagina: Secondary | ICD-10-CM

## 2016-03-03 DIAGNOSIS — Z30013 Encounter for initial prescription of injectable contraceptive: Secondary | ICD-10-CM

## 2016-03-03 DIAGNOSIS — I10 Essential (primary) hypertension: Secondary | ICD-10-CM

## 2016-03-03 DIAGNOSIS — B3731 Acute candidiasis of vulva and vagina: Secondary | ICD-10-CM

## 2016-03-03 DIAGNOSIS — B9689 Other specified bacterial agents as the cause of diseases classified elsewhere: Secondary | ICD-10-CM

## 2016-03-03 HISTORY — DX: Personal history of pre-term labor: Z87.51

## 2016-03-03 HISTORY — DX: History of uterine scar from previous surgery: Z98.891

## 2016-03-03 HISTORY — DX: Severe pre-eclampsia, unspecified trimester: O14.10

## 2016-03-03 LAB — COMPREHENSIVE METABOLIC PANEL
ALK PHOS: 51 U/L (ref 38–126)
ALT: 11 U/L — AB (ref 14–54)
ANION GAP: 9 (ref 5–15)
AST: 19 U/L (ref 15–41)
Albumin: 4.3 g/dL (ref 3.5–5.0)
BILIRUBIN TOTAL: 0.5 mg/dL (ref 0.3–1.2)
BUN: 11 mg/dL (ref 6–20)
CALCIUM: 9.8 mg/dL (ref 8.9–10.3)
CO2: 26 mmol/L (ref 22–32)
CREATININE: 0.73 mg/dL (ref 0.44–1.00)
Chloride: 103 mmol/L (ref 101–111)
Glucose, Bld: 97 mg/dL (ref 65–99)
Potassium: 3 mmol/L — ABNORMAL LOW (ref 3.5–5.1)
SODIUM: 138 mmol/L (ref 135–145)
TOTAL PROTEIN: 8.1 g/dL (ref 6.5–8.1)

## 2016-03-03 LAB — URINALYSIS, ROUTINE W REFLEX MICROSCOPIC
BILIRUBIN URINE: NEGATIVE
GLUCOSE, UA: NEGATIVE mg/dL
Ketones, ur: NEGATIVE mg/dL
Nitrite: NEGATIVE
Protein, ur: NEGATIVE mg/dL
SPECIFIC GRAVITY, URINE: 1.02 (ref 1.005–1.030)
pH: 6 (ref 5.0–8.0)

## 2016-03-03 LAB — CBC
HEMATOCRIT: 34.2 % — AB (ref 36.0–46.0)
HEMOGLOBIN: 11.4 g/dL — AB (ref 12.0–15.0)
MCH: 24.5 pg — AB (ref 26.0–34.0)
MCHC: 33.3 g/dL (ref 30.0–36.0)
MCV: 73.5 fL — AB (ref 78.0–100.0)
Platelets: 380 10*3/uL (ref 150–400)
RBC: 4.65 MIL/uL (ref 3.87–5.11)
RDW: 15 % (ref 11.5–15.5)
WBC: 3.8 10*3/uL — ABNORMAL LOW (ref 4.0–10.5)

## 2016-03-03 LAB — POCT PREGNANCY, URINE: PREG TEST UR: NEGATIVE

## 2016-03-03 LAB — URINE MICROSCOPIC-ADD ON: RBC / HPF: NONE SEEN RBC/hpf (ref 0–5)

## 2016-03-03 LAB — WET PREP, GENITAL
Sperm: NONE SEEN
TRICH WET PREP: NONE SEEN
YEAST WET PREP: NONE SEEN

## 2016-03-03 LAB — HCG, QUANTITATIVE, PREGNANCY: hCG, Beta Chain, Quant, S: <1 m[IU]/mL (ref <5)

## 2016-03-03 LAB — HIV ANTIBODY (ROUTINE TESTING W REFLEX): HIV SCREEN 4TH GENERATION: NONREACTIVE

## 2016-03-03 MED ORDER — IBUPROFEN 600 MG PO TABS
600.0000 mg | ORAL_TABLET | Freq: Once | ORAL | Status: AC
  Administered 2016-03-03: 600 mg via ORAL
  Filled 2016-03-03: qty 1

## 2016-03-03 MED ORDER — LISINOPRIL 10 MG PO TABS: 10.0000 mg | ORAL_TABLET | Freq: Every day | ORAL | 1 refills | Status: DC

## 2016-03-03 MED ORDER — MEDROXYPROGESTERONE ACETATE 150 MG/ML IM SUSP
150.0000 mg | Freq: Once | INTRAMUSCULAR | Status: AC
  Administered 2016-03-03: 150 mg via INTRAMUSCULAR
  Filled 2016-03-03: qty 1

## 2016-03-03 MED ORDER — IBUPROFEN 600 MG PO TABS: 600.0000 mg | ORAL_TABLET | Freq: Four times a day (QID) | ORAL | 1 refills | Status: DC | PRN

## 2016-03-03 MED ORDER — METRONIDAZOLE 500 MG PO TABS: 500.0000 mg | ORAL_TABLET | Freq: Two times a day (BID) | ORAL | 0 refills | Status: DC

## 2016-03-03 MED ORDER — LISINOPRIL 10 MG PO TABS
10.0000 mg | ORAL_TABLET | Freq: Once | ORAL | Status: AC
  Administered 2016-03-03: 10 mg via ORAL
  Filled 2016-03-03: qty 1

## 2016-03-03 MED ORDER — FLUCONAZOLE 150 MG PO TABS
150.0000 mg | ORAL_TABLET | Freq: Once | ORAL | Status: AC
  Administered 2016-03-03: 150 mg via ORAL
  Filled 2016-03-03: qty 1

## 2016-03-03 NOTE — Discharge Instructions (Signed)
Bacterial Vaginosis °Bacterial vaginosis is a vaginal infection that occurs when the normal balance of bacteria in the vagina is disrupted. It results from an overgrowth of certain bacteria. This is the most common vaginal infection in women of childbearing age. Treatment is important to prevent complications, especially in pregnant women, as it can cause a premature delivery. °CAUSES  °Bacterial vaginosis is caused by an increase in harmful bacteria that are normally present in smaller amounts in the vagina. Several different kinds of bacteria can cause bacterial vaginosis. However, the reason that the condition develops is not fully understood. °RISK FACTORS °Certain activities or behaviors can put you at an increased risk of developing bacterial vaginosis, including: °· Having a new sex partner or multiple sex partners. °· Douching. °· Using an intrauterine device (IUD) for contraception. °Women do not get bacterial vaginosis from toilet seats, bedding, swimming pools, or contact with objects around them. °SIGNS AND SYMPTOMS  °Some women with bacterial vaginosis have no signs or symptoms. Common symptoms include: °· Grey vaginal discharge. °· A fishlike odor with discharge, especially after sexual intercourse. °· Itching or burning of the vagina and vulva. °· Burning or pain with urination. °DIAGNOSIS  °Your health care provider will take a medical history and examine the vagina for signs of bacterial vaginosis. A sample of vaginal fluid may be taken. Your health care provider will look at this sample under a microscope to check for bacteria and abnormal cells. A vaginal pH test may also be done.  °TREATMENT  °Bacterial vaginosis may be treated with antibiotic medicines. These may be given in the form of a pill or a vaginal cream. A second round of antibiotics may be prescribed if the condition comes back after treatment. Because bacterial vaginosis increases your risk for sexually transmitted diseases, getting  treated can help reduce your risk for chlamydia, gonorrhea, HIV, and herpes. °HOME CARE INSTRUCTIONS  °· Only take over-the-counter or prescription medicines as directed by your health care provider. °· If antibiotic medicine was prescribed, take it as directed. Make sure you finish it even if you start to feel better. °· Tell all sexual partners that you have a vaginal infection. They should see their health care provider and be treated if they have problems, such as a mild rash or itching. °· During treatment, it is important that you follow these instructions: °· Avoid sexual activity or use condoms correctly. °· Do not douche. °· Avoid alcohol as directed by your health care provider. °· Avoid breastfeeding as directed by your health care provider. °SEEK MEDICAL CARE IF:  °· Your symptoms are not improving after 3 days of treatment. °· You have increased discharge or pain. °· You have a fever. °MAKE SURE YOU:  °· Understand these instructions. °· Will watch your condition. °· Will get help right away if you are not doing well or get worse. °FOR MORE INFORMATION  °Centers for Disease Control and Prevention, Division of STD Prevention: www.cdc.gov/std °American Sexual Health Association (ASHA): www.ashastd.org  °  °This information is not intended to replace advice given to you by your health care provider. Make sure you discuss any questions you have with your health care provider. °  °Document Released: 07/23/2005 Document Revised: 08/13/2014 Document Reviewed: 03/04/2013 °Elsevier Interactive Patient Education ©2016 Elsevier Inc. °Hypertension °Hypertension, commonly called high blood pressure, is when the force of blood pumping through your arteries is too strong. Your arteries are the blood vessels that carry blood from your heart throughout your body. A blood   pressure reading consists of a higher number over a lower number, such as 110/72. The higher number (systolic) is the pressure inside your arteries  when your heart pumps. The lower number (diastolic) is the pressure inside your arteries when your heart relaxes. Ideally you want your blood pressure below 120/80. °Hypertension forces your heart to work harder to pump blood. Your arteries may become narrow or stiff. Having untreated or uncontrolled hypertension can cause heart attack, stroke, kidney disease, and other problems. °RISK FACTORS °Some risk factors for high blood pressure are controllable. Others are not.  °Risk factors you cannot control include:  °· Race. You may be at higher risk if you are African American. °· Age. Risk increases with age. °· Gender. Men are at higher risk than women before age 45 years. After age 65, women are at higher risk than men. °Risk factors you can control include: °· Not getting enough exercise or physical activity. °· Being overweight. °· Getting too much fat, sugar, calories, or salt in your diet. °· Drinking too much alcohol. °SIGNS AND SYMPTOMS °Hypertension does not usually cause signs or symptoms. Extremely high blood pressure (hypertensive crisis) may cause headache, anxiety, shortness of breath, and nosebleed. °DIAGNOSIS °To check if you have hypertension, your health care provider will measure your blood pressure while you are seated, with your arm held at the level of your heart. It should be measured at least twice using the same arm. Certain conditions can cause a difference in blood pressure between your right and left arms. A blood pressure reading that is higher than normal on one occasion does not mean that you need treatment. If it is not clear whether you have high blood pressure, you may be asked to return on a different day to have your blood pressure checked again. Or, you may be asked to monitor your blood pressure at home for 1 or more weeks. °TREATMENT °Treating high blood pressure includes making lifestyle changes and possibly taking medicine. Living a healthy lifestyle can help lower high blood  pressure. You may need to change some of your habits. °Lifestyle changes may include: °· Following the DASH diet. This diet is high in fruits, vegetables, and whole grains. It is low in salt, red meat, and added sugars. °· Keep your sodium intake below 2,300 mg per day. °· Getting at least 30-45 minutes of aerobic exercise at least 4 times per week. °· Losing weight if necessary. °· Not smoking. °· Limiting alcoholic beverages. °· Learning ways to reduce stress. °Your health care provider may prescribe medicine if lifestyle changes are not enough to get your blood pressure under control, and if one of the following is true: °· You are 18-59 years of age and your systolic blood pressure is above 140. °· You are 60 years of age or older, and your systolic blood pressure is above 150. °· Your diastolic blood pressure is above 90. °· You have diabetes, and your systolic blood pressure is over 140 or your diastolic blood pressure is over 90. °· You have kidney disease and your blood pressure is above 140/90. °· You have heart disease and your blood pressure is above 140/90. °Your personal target blood pressure may vary depending on your medical conditions, your age, and other factors. °HOME CARE INSTRUCTIONS °· Have your blood pressure rechecked as directed by your health care provider.   °· Take medicines only as directed by your health care provider. Follow the directions carefully. Blood pressure medicines must be taken as   prescribed. The medicine does not work as well when you skip doses. Skipping doses also puts you at risk for problems. °· Do not smoke.   °· Monitor your blood pressure at home as directed by your health care provider.  °SEEK MEDICAL CARE IF:  °· You think you are having a reaction to medicines taken. °· You have recurrent headaches or feel dizzy. °· You have swelling in your ankles. °· You have trouble with your vision. °SEEK IMMEDIATE MEDICAL CARE IF: °· You develop a severe headache or  confusion. °· You have unusual weakness, numbness, or feel faint. °· You have severe chest or abdominal pain. °· You vomit repeatedly. °· You have trouble breathing. °MAKE SURE YOU:  °· Understand these instructions. °· Will watch your condition. °· Will get help right away if you are not doing well or get worse. °  °This information is not intended to replace advice given to you by your health care provider. Make sure you discuss any questions you have with your health care provider. °  °Document Released: 07/23/2005 Document Revised: 12/07/2014 Document Reviewed: 05/15/2013 °Elsevier Interactive Patient Education ©2016 Elsevier Inc. ° °

## 2016-03-03 NOTE — MAU Note (Signed)
Pt states she is having sharp, cramping pain in her lower right abdomen and in her back.  Pt states she was supposed to start her period three days ago.  Pt states she took a home pregnancy test and the line was faint.

## 2016-03-03 NOTE — MAU Provider Note (Signed)
Chief Complaint: Abdominal Pain   First Provider Initiated Contact with Patient 03/03/16 [phone]     SUBJECTIVE HPI: Eileen Miller is a 20 y.o. G25P0101 female who presents to Maternity Admissions reporting the 3 days late for her, faint positive UPT and moderate right lower quadrant pain 2 days. Head faint spotting around the time. Would've started, but not like normal menstrual periods. States the pain that she is having is different and worse than her usual dysmenorrhea. Has history of chronic hypertension, but ran out of labetalol and has not taken it in several days. Is sexually active but does not use birth control because she states her doctor told her she could be on it because of her hypertension. Does not currently have a primary care provider or gynecologist  Location: Right lower quadrant/groin Quality: Sharp Severity: 4/10 on pain scale Duration: 2 days Context: Late for her  Timing: Intermittent Modifying factors: Improves with pressure. Hasn't needed to take medication for it. Associated signs and symptoms: Positive for loose stools once per day 1 week, being late for her period. Negative for fever, chills, nausea, vomiting, constipation, dysuria, urgency, frequency, hematuria, vaginal discharge or vaginal bleeding.  Past Medical History:  Diagnosis Date  . History of cesarean section 2015   For severe Preeclampsia. Did not labor.   . History of preterm delivery 2015   For severe Preeclampsia. Did not labor.   . Hypertension   . Severe preeclampsia 2015   OB History  Gravida Para Term Preterm AB Living  1 1   1   1   SAB TAB Ectopic Multiple Live Births          1    # Outcome Date GA Lbr Len/2nd Weight Sex Delivery Anes PTL Lv  1 Preterm 04/01/14 [redacted]w[redacted]d   M CS-LTranv  N LIV     Complications: Preeclampsia, severe, third trimester     Past Surgical History:  Procedure Laterality Date  . CESAREAN SECTION    . EYE SURGERY    . HERNIA REPAIR     Social History    Social History  . Marital status: Single    Spouse name: N/A  . Number of children: N/A  . Years of education: N/A   Occupational History  . Not on file.   Social History Main Topics  . Smoking status: Never Smoker  . Smokeless tobacco: Never Used  . Alcohol use Yes     Comment: social  . Drug use: No  . Sexual activity: Yes    Birth control/ protection: None   Other Topics Concern  . Not on file   Social History Narrative  . No narrative on file   No current facility-administered medications on file prior to encounter.    No current outpatient prescriptions on file prior to encounter.   Allergies  Allergen Reactions  . Food Swelling and Other (See Comments)    Pt states that she is allergic to peanut butter.   Reaction:  Tongue swelling     I have reviewed the past Medical Hx, Surgical Hx, Social Hx, Allergies and Medications.   Review of Systems  Constitutional: Negative for appetite change, chills and fever.  Eyes: Negative for visual disturbance.  Respiratory: Negative for shortness of breath.   Cardiovascular: Negative for chest pain.  Gastrointestinal: Positive for abdominal pain and diarrhea. Negative for abdominal distention, blood in stool, constipation, nausea and vomiting.  Genitourinary: Negative for dysuria, frequency, hematuria, urgency, vaginal bleeding, vaginal discharge and vaginal pain.  Neurological: Negative for headaches.    OBJECTIVE Patient Vitals for the past 24 hrs:  BP Temp Temp src Pulse Resp  03/03/16 1054 (!) 143/113 - - 70 -  03/03/16 0851 (!) 176/128 99.2 F (37.3 C) Oral 99 18   Constitutional: Well-developed, well-nourished female in no acute distress.  Cardiovascular: normal rate Respiratory: normal rate and effort.  GI: Abd soft, non-tender. No guarding, rebound tenderness or palpable mass.Pos BS x 4 MS: Extremities nontender, no edema, normal ROM Neurologic: Alert and oriented x 4.  GU: Neg CVAT.  SPECULUM EXAM:  NEFG, physiologic discharge, no blood noted, cervix clean  BIMANUAL: cervix closed; uterus normal size, no adnexal tenderness or masses. No CMT.  LAB RESULTS Results for orders placed or performed during the hospital encounter of 03/03/16 (from the past 24 hour(s))  Urinalysis, Routine w reflex microscopic (not at [facility])     Status: Abnormal   Collection Time: 03/03/16  8:45 AM  Result Value Ref Range   Color, Urine YELLOW YELLOW   APPearance CLEAR CLEAR   Specific Gravity, Urine 1.020 1.005 - 1.030   pH 6.0 5.0 - 8.0   Glucose, UA NEGATIVE NEGATIVE mg/dL   Hgb urine dipstick TRACE (A) NEGATIVE   Bilirubin Urine NEGATIVE NEGATIVE   Ketones, ur NEGATIVE NEGATIVE mg/dL   Protein, ur NEGATIVE NEGATIVE mg/dL   Nitrite NEGATIVE NEGATIVE   Leukocytes, UA LARGE (A) NEGATIVE  Urine microscopic-add on     Status: Abnormal   Collection Time: 03/03/16  8:45 AM  Result Value Ref Range   Squamous Epithelial / LPF TOO NUMEROUS TO COUNT (A) NONE SEEN   WBC, UA 6-30 0 - 5 WBC/hpf   RBC / HPF NONE SEEN 0 - 5 RBC/hpf   Bacteria, UA FEW (A) NONE SEEN   Urine-Other MUCOUS PRESENT   Pregnancy, urine POC     Status: None   Collection Time: 03/03/16  8:53 AM  Result Value Ref Range   Preg Test, Ur NEGATIVE NEGATIVE  hCG, quantitative, pregnancy     Status: None   Collection Time: 03/03/16  9:08 AM  Result Value Ref Range   hCG, Beta Chain, Quant, S <1 <5 mIU/mL  CBC     Status: Abnormal   Collection Time: 03/03/16  9:08 AM  Result Value Ref Range   WBC 3.8 (L) 4.0 - 10.5 K/uL   RBC 4.65 3.87 - 5.11 MIL/uL   Hemoglobin 11.4 (L) 12.0 - 15.0 g/dL   HCT 16.1 (L) 09.6 - 04.5 %   MCV 73.5 (L) 78.0 - 100.0 fL   MCH 24.5 (L) 26.0 - 34.0 pg   MCHC 33.3 30.0 - 36.0 g/dL   RDW 40.9 81.1 - 91.4 %   Platelets 380 150 - 400 K/uL  Comprehensive metabolic panel     Status: Abnormal   Collection Time: 03/03/16  9:08 AM  Result Value Ref Range   Sodium 138 135 - 145 mmol/L   Potassium 3.0 (L) 3.5 - 5.1  mmol/L   Chloride 103 101 - 111 mmol/L   CO2 26 22 - 32 mmol/L   Glucose, Bld 97 65 - 99 mg/dL   BUN 11 6 - 20 mg/dL   Creatinine, Ser 7.82 0.44 - 1.00 mg/dL   Calcium 9.8 8.9 - 95.6 mg/dL   Total Protein 8.1 6.5 - 8.1 g/dL   Albumin 4.3 3.5 - 5.0 g/dL   AST 19 15 - 41 U/L   ALT 11 (L) 14 - 54 U/L  Alkaline Phosphatase 51 38 - 126 U/L   Total Bilirubin 0.5 0.3 - 1.2 mg/dL   GFR calc non Af Amer >60 >60 mL/min   GFR calc Af Amer >60 >60 mL/min   Anion gap 9 5 - 15  Wet prep, genital     Status: Abnormal   Collection Time: 03/03/16  9:30 AM  Result Value Ref Range   Yeast Wet Prep HPF POC NONE SEEN NONE SEEN   Trich, Wet Prep NONE SEEN NONE SEEN   Clue Cells Wet Prep HPF POC PRESENT (A) NONE SEEN   WBC, Wet Prep HPF POC MODERATE (A) NONE SEEN   Sperm NONE SEEN     IMAGING No results found.  MAU COURSE Orders Placed This Encounter  Procedures  . Wet prep, genital  . Urinalysis, Routine w reflex microscopic (not at [facility])  . hCG, quantitative, pregnancy  . CBC  . Comprehensive metabolic panel  . HIV antibody (routine testing) (NOT for [facility])  . Urine microscopic-add on  . Vital signs  . Pregnancy, urine POC   Meds ordered this encounter  Medications  . lisinopril (PRINIVIL,ZESTRIL) tablet 10 mg  . fluconazole (DIFLUCAN) tablet 150 mg  . ibuprofen (ADVIL,MOTRIN) tablet 600 mg  . medroxyPROGESTERone (DEPO-PROVERA) injection 150 mg  . metroNIDAZOLE (FLAGYL) 500 MG tablet    Sig: Take 1 tablet (500 mg total) by mouth 2 (two) times daily.    Dispense:  14 tablet    Refill:  0    Order Specific Question:   Supervising Provider    Answer:   Levie Heritage [[id number]]  . lisinopril (ZESTRIL) 10 MG tablet    Sig: Take 1 tablet (10 mg total) by mouth daily.    Dispense:  30 tablet    Refill:  1    Order Specific Question:   Supervising Provider    Answer:   Levie Heritage [[id number]]  . ibuprofen (ADVIL,MOTRIN) 600 MG tablet    Sig: Take 1 tablet (600 mg total) by mouth every  6 (six) hours as needed for moderate pain.    Dispense:  30 tablet    Refill:  1    Order Specific Question:   Supervising Provider    Answer:   Levie Heritage [[id number]]     MDM  ASSESSMENT 1. Secondary amenorrhea   2. Negative pregnancy test   3. Essential hypertension   4. Evaluation for contraceptive injection   5. BV (bacterial vaginosis)   6. Vaginal yeast infection     PLAN Discharge home in stable conditionPer consult with Dr. Adrian Blackwater. Hypertension Precautions Needs to follow up with gynecologist in 12 weeks for Depo-Provera Follow-up Information    [facility] Schedule an appointment as soon as possible for a visit today.   Why:  For management of hypertension Contact information: [address] [place] [address] [phone]        Gynecologist of your choice .   Why:  For routine gynecology care, contraceptive management and Pap smears       [facility] AD[facility] .   Why:  For gynecologic emergencies Contact information: [address] [id number] mc [place] [address] [phone]       [facility] EMERGENCY DEPARTMENT .   Specialty:  Emergency Medicine Why:  For chest pain, shortness of breath, severe headaches. May be signs of complications from severe high blood pressure Contact information: [address] [id number] m[place]  [address] [phone]           Medication List    STOP taking these medications   labetalol 100 MG tablet Commonly known as:  NORMODYNE     TAKE these medications   ibuprofen 600 MG tablet Commonly known as:  ADVIL,MOTRIN Take 1 tablet (600 mg total) by mouth every 6 (six) hours as needed for moderate pain.   lisinopril 10 MG tablet Commonly known as:  ZESTRIL Take 1 tablet (10 mg total) by mouth daily.   metroNIDAZOLE 500 MG tablet Commonly known as:  FLAGYL Take 1 tablet (500 mg  total) by mouth 2 (two) times daily.        [place], CNM 03/03/2016  11:36 AM

## 2016-03-05 LAB — GC/CHLAMYDIA PROBE AMP (~~LOC~~) NOT AT ARMC
CHLAMYDIA, DNA PROBE: NEGATIVE
Neisseria Gonorrhea: NEGATIVE

## 2016-12-18 ENCOUNTER — Emergency Department (HOSPITAL_COMMUNITY): Admission: EM | Admit: 2016-12-18 | Discharge: 2016-12-18 | Discharge status: Against Medical Advice | Payer: Self-pay

## 2017-03-04 ENCOUNTER — Encounter (HOSPITAL_COMMUNITY): Payer: Self-pay

## 2017-03-04 ENCOUNTER — Inpatient Hospital Stay (HOSPITAL_COMMUNITY)
Admission: AD | Admit: 2017-03-04 | Discharge: 2017-03-04 | Disposition: A | Discharge status: Home / Self Care | Payer: Self-pay | Source: Ambulatory Visit | Attending: Obstetrics & Gynecology | Admitting: Obstetrics & Gynecology

## 2017-03-04 DIAGNOSIS — N939 Abnormal uterine and vaginal bleeding, unspecified: Secondary | ICD-10-CM

## 2017-03-04 LAB — URINALYSIS, ROUTINE W REFLEX MICROSCOPIC
Bilirubin Urine: NEGATIVE
Glucose, UA: NEGATIVE mg/dL
KETONES UR: NEGATIVE mg/dL
LEUKOCYTES UA: NEGATIVE
Nitrite: NEGATIVE
PROTEIN: 100 mg/dL — AB
Specific Gravity, Urine: 1.015 (ref 1.005–1.030)
pH: 6.5 (ref 5.0–8.0)

## 2017-03-04 LAB — CBC
HEMATOCRIT: 33.1 % — AB (ref 36.0–46.0)
Hemoglobin: 10.4 g/dL — ABNORMAL LOW (ref 12.0–15.0)
MCH: 23.6 pg — ABNORMAL LOW (ref 26.0–34.0)
MCHC: 31.4 g/dL (ref 30.0–36.0)
MCV: 75.1 fL — ABNORMAL LOW (ref 78.0–100.0)
PLATELETS: 338 10*3/uL (ref 150–400)
RBC: 4.41 MIL/uL (ref 3.87–5.11)
RDW: 15.6 % — AB (ref 11.5–15.5)
WBC: 4.4 10*3/uL (ref 4.0–10.5)

## 2017-03-04 LAB — URINALYSIS, MICROSCOPIC (REFLEX)

## 2017-03-04 LAB — POCT PREGNANCY, URINE: PREG TEST UR: NEGATIVE

## 2017-03-04 NOTE — MAU Note (Signed)
Had period 3 weeks ago (February 05, 2017). Started bleeding again yesterday morning.Went to the bathroom last night and heard/saw a blood clot in the toilet. Unsure if pregnant.

## 2017-03-04 NOTE — MAU Provider Note (Signed)
Patient Emmaline Lifeatianna Hirst is a 21 y.o. G1P0101  Here with complaints of having her period two times in 3 weeks. She denies urgency, lower back pain, discharge, itching or other ob-gyn complaint.  History     CSN: [id number]  Arrival date and time: 03/04/17 1112   None     Chief Complaint  Patient presents with  . Vaginal Bleeding   Vaginal Bleeding  The patient's primary symptoms include vaginal bleeding. The patient's pertinent negatives include no genital itching, genital lesions or genital odor. This is a new problem. The current episode started in the past 7 days. The problem occurs constantly. The problem has been unchanged. The patient is experiencing no pain. She is not pregnant. Associated symptoms include abdominal pain. Associated symptoms comments: Mild cramping that the patient associates with her period. . The vaginal discharge was bloody. The vaginal bleeding is typical of menses. She has been passing clots. She has not been passing tissue. Nothing aggravates the symptoms. She has tried nothing for the symptoms. She uses nothing for contraception. Her menstrual history has been regular.    OB History    Gravida Para Term Preterm AB Living   1 1   1   1    SAB TAB Ectopic Multiple Live Births           1      Past Medical History:  Diagnosis Date  . History of cesarean section 03/03/2016   For severe Preeclampsia. Did not labor.   . History of preterm delivery 03/03/2016   For severe Preeclampsia. Did not labor.   . Hypertension   . Severe preeclampsia 2015    Past Surgical History:  Procedure Laterality Date  . CESAREAN SECTION    . EYE SURGERY    . HERNIA REPAIR      Family History  Problem Relation Age of Onset  . Hypertension Father   . Hypertension Maternal Grandmother   . Hypertension Paternal Grandmother     Social History  Substance Use Topics  . Smoking status: Never Smoker  . Smokeless tobacco: Never Used  . Alcohol use Yes     Comment: social     Allergies:  Allergies  Allergen Reactions  . Food Swelling and Other (See Comments)    Pt states that she is allergic to peanut butter.   Reaction:  Tongue swelling     Prescriptions Prior to Admission  Medication Sig Dispense Refill Last Dose  . ibuprofen (ADVIL,MOTRIN) 600 MG tablet Take 1 tablet (600 mg total) by mouth every 6 (six) hours as needed for moderate pain. 30 tablet 1   . lisinopril (ZESTRIL) 10 MG tablet Take 1 tablet (10 mg total) by mouth daily. 30 tablet 1   . metroNIDAZOLE (FLAGYL) 500 MG tablet Take 1 tablet (500 mg total) by mouth 2 (two) times daily. 14 tablet 0     Review of Systems  Respiratory: Negative.   Cardiovascular: Negative.   Gastrointestinal: Positive for abdominal pain.  Genitourinary: Positive for vaginal bleeding.  Musculoskeletal: Negative.   Neurological: Negative.    Physical Exam   Blood pressure (!) 159/108, pulse 75, temperature 97.6 F (36.4 C), temperature source Oral, resp. rate 18, height 5\' 6"  (1.676 m), weight 177 lb (80.3 kg), last menstrual period 02/05/2017, unknown if currently breastfeeding.  Physical Exam  Constitutional: She is oriented to person, place, and time. She appears well-developed and well-nourished.  HENT:  Head: Normocephalic.  Neck: Normal range of motion.  Cardiovascular: Normal rate.  Respiratory: Effort normal.  GI: Soft.  Genitourinary: Vagina normal.  Genitourinary Comments: NEFG; moderate bright red blood in the vagina. One small clot removed. Cervix is pink with no lesions; no CMt, adnexal or suprapubic tenderness.   Musculoskeletal: Normal range of motion.  Neurological: She is alert and oriented to person, place, and time.  Skin: Skin is warm and dry.  Psychiatric: She has a normal mood and affect.    MAU Course  Procedures  MDM -CBC: normal -UPT: negative  Discussed abnormal uterine bleeding and reassured patient that women frequently have abnormal bleeding and that she is very  unlikely to have anything else wrong with her. If patient desires to have regular periods, she could consider contraception. Patient verbalized understanding.   Patient knows that her blood pressure is high and plans to continue taking her blood pressure medication.  Assessment and Plan   1. Abnormal uterine bleeding    2. Patient stable for discharge with recommendation to call clinic for GYN appointment if she decides that she wants hormonal methods to regulate her periods.  3. All questions answered.  Charlesetta GaribaldiKathryn Lorraine Analyah Mcconnon 03/04/2017, 11:46 AM

## 2017-03-04 NOTE — Discharge Instructions (Signed)
Dysfunctional Uterine Bleeding °Dysfunctional uterine bleeding is abnormal bleeding from the uterus. Dysfunctional uterine bleeding includes: °· A period that comes earlier or later than usual. °· A period that is lighter, heavier, or has blood clots. °· Bleeding between periods. °· Skipping one or more periods. °· Bleeding after sexual intercourse. °· Bleeding after menopause. ° °Follow these instructions at home: °Pay attention to any changes in your symptoms. Follow these instructions to help with your condition: °Eating and drinking °· Eat well-balanced meals. Include foods that are high in iron, such as liver, meat, shellfish, green leafy vegetables, and eggs. °· If you become constipated: °? Drink plenty of water. °? Eat fruits and vegetables that are high in water and fiber, such as spinach, carrots, raspberries, apples, and mango. °Medicines °· Take over-the-counter and prescription medicines only as told by your health care provider. °· Do not change medicines without talking with your health care provider. °· Aspirin or medicines that contain aspirin may make the bleeding worse. Do not take those medicines: °? During the week before your period. °? During your period. °· If you were prescribed iron pills, take them as told by your health care provider. Iron pills help to replace iron that your body loses because of this condition. °Activity °· If you need to change your sanitary pad or tampon more than one time every 2 hours: °? Lie in bed with your feet raised (elevated). °? Place a cold pack on your lower abdomen. °? Rest as much as possible until the bleeding stops or slows down. °· Do not try to lose weight until the bleeding has stopped and your blood iron level is back to normal. °Other Instructions °· For two months, write down: °? When your period starts. °? When your period ends. °? When any abnormal bleeding occurs. °? What problems you notice. °· Keep all follow up visits as told by your health  care provider. This is important. °Contact a health care provider if: °· You get light-headed or weak. °· You have nausea and vomiting. °· You cannot eat or drink without vomiting. °· You feel dizzy or have diarrhea while you are taking medicines. °· You are taking birth control pills or hormones, and you want to change them or stop taking them. °Get help right away if: °· You develop a fever or chills. °· You need to change your sanitary pad or tampon more than one time per hour. °· Your bleeding becomes heavier, or your flow contains clots more often. °· You develop pain in your abdomen. °· You lose consciousness. °· You develop a rash. °This information is not intended to replace advice given to you by your health care provider. Make sure you discuss any questions you have with your health care provider. °Document Released: 07/20/2000 Document Revised: 12/29/2015 Document Reviewed: 10/18/2014 °Elsevier Interactive Patient Education © 2018 Elsevier Inc. ° °

## 2017-04-08 ENCOUNTER — Encounter (HOSPITAL_COMMUNITY): Payer: Self-pay | Admitting: *Deleted

## 2017-04-08 ENCOUNTER — Inpatient Hospital Stay (HOSPITAL_COMMUNITY)
Admission: AD | Admit: 2017-04-08 | Discharge: 2017-04-08 | Disposition: A | Discharge status: Home / Self Care | Payer: Medicaid Other | Source: Ambulatory Visit | Attending: Obstetrics & Gynecology | Admitting: Obstetrics & Gynecology

## 2017-04-08 DIAGNOSIS — O10912 Unspecified pre-existing hypertension complicating pregnancy, second trimester: Secondary | ICD-10-CM

## 2017-04-08 DIAGNOSIS — Z3A08 8 weeks gestation of pregnancy: Secondary | ICD-10-CM | POA: Diagnosis not present

## 2017-04-08 DIAGNOSIS — Z3687 Encounter for antenatal screening for uncertain dates: Secondary | ICD-10-CM | POA: Diagnosis not present

## 2017-04-08 DIAGNOSIS — O163 Unspecified maternal hypertension, third trimester: Secondary | ICD-10-CM | POA: Insufficient documentation

## 2017-04-08 DIAGNOSIS — Z79899 Other long term (current) drug therapy: Secondary | ICD-10-CM | POA: Diagnosis not present

## 2017-04-08 DIAGNOSIS — O10919 Unspecified pre-existing hypertension complicating pregnancy, unspecified trimester: Secondary | ICD-10-CM

## 2017-04-08 DIAGNOSIS — Z3201 Encounter for pregnancy test, result positive: Secondary | ICD-10-CM | POA: Diagnosis not present

## 2017-04-08 LAB — POCT PREGNANCY, URINE: PREG TEST UR: POSITIVE — AB

## 2017-04-08 MED ORDER — LABETALOL HCL 200 MG PO TABS: 200.0000 mg | ORAL_TABLET | Freq: Two times a day (BID) | ORAL | 3 refills | Status: DC

## 2017-04-08 NOTE — MAU Note (Signed)
URINE IN LAB 

## 2017-04-08 NOTE — MAU Provider Note (Signed)
Chief Complaint: proof of pregnancy   None     SUBJECTIVE HPI: Eileen Miller is a 21 y.o. G2P0101 at [redacted]w[redacted]d by LMP who presents to maternity admissions reporting unsure LMP, positive pregnancy test at home, and elevated BP with history of preeclampsia in her last pregnancy.  She was delivered at [redacted]w[redacted]d by C/S for severe preeclampsia in 2015.  She denies any pain or bleeding today. She stopped taking her lisinopril 3 weeks ago because she was not sure about its safety in pregnancy and she is concerned that her BP may be up without the medication.  She had 2 periods in July, which was unusual, the first on 02/06/17 was normal, followed by an episode of light bleeding on 03/03/17.  She has had no bleeding, and no other associated symptoms since then.  She denies vaginal bleeding, vaginal itching/burning, urinary symptoms, h/a, dizziness, n/v, or fever/chills.     HPI  Past Medical History:  Diagnosis Date  . History of cesarean section 03/03/2016   For severe Preeclampsia. Did not labor.   . History of preterm delivery 03/03/2016   For severe Preeclampsia. Did not labor.   . Hypertension   . Severe preeclampsia 2015   Past Surgical History:  Procedure Laterality Date  . CESAREAN SECTION    . EYE SURGERY    . HERNIA REPAIR     Social History   Social History  . Marital status: Single    Spouse name: N/A  . Number of children: N/A  . Years of education: N/A   Occupational History  . Not on file.   Social History Main Topics  . Smoking status: Never Smoker  . Smokeless tobacco: Never Used  . Alcohol use Yes     Comment: social  . Drug use: No  . Sexual activity: Yes    Birth control/ protection: None   Other Topics Concern  . Not on file   Social History Narrative  . No narrative on file   No current facility-administered medications on file prior to encounter.    Current Outpatient Prescriptions on File Prior to Encounter  Medication Sig Dispense Refill  . ibuprofen  (ADVIL,MOTRIN) 600 MG tablet Take 1 tablet (600 mg total) by mouth every 6 (six) hours as needed for moderate pain. 30 tablet 1  . lisinopril (ZESTRIL) 10 MG tablet Take 1 tablet (10 mg total) by mouth daily. 30 tablet 1   Allergies  Allergen Reactions  . Food Swelling and Other (See Comments)    Pt states that she is allergic to peanut butter.   Reaction:  Tongue swelling     ROS:  Review of Systems  Constitutional: Negative for chills, fatigue and fever.  Eyes: Negative for visual disturbance.  Respiratory: Negative for shortness of breath.   Cardiovascular: Negative for chest pain.  Gastrointestinal: Negative for abdominal pain, nausea and vomiting.  Genitourinary: Negative for difficulty urinating, dysuria, flank pain, pelvic pain, vaginal bleeding, vaginal discharge and vaginal pain.  Neurological: Negative for dizziness and headaches.  Psychiatric/Behavioral: Negative.      I have reviewed patient's Past Medical Hx, Surgical Hx, Family Hx, Social Hx, medications and allergies.   Physical Exam  Patient Vitals for the past 24 hrs:  BP Temp Temp src Pulse Resp  04/08/17 0951 (!) 164/115 98.4 F (36.9 C) Oral 81 18   Constitutional: Well-developed, well-nourished female in no acute distress.  Cardiovascular: normal rate Respiratory: normal effort GI: Abd soft, non-tender. Pos BS x 4 MS: Extremities nontender,  no edema, normal ROM Neurologic: Alert and oriented x 4.  GU: Neg CVAT.  PELVIC EXAM: Deferred   LAB RESULTS Results for orders placed or performed during the hospital encounter of 04/08/17 (from the past 24 hour(s))  Pregnancy, urine POC     Status: Abnormal   Collection Time: 04/08/17 10:02 AM  Result Value Ref Range   Preg Test, Ur POSITIVE (A) NEGATIVE       IMAGING No results found.  MAU Management/MDM: Ordered pregnancy test and reviewed positive results with pt.  Pregnancy confirmation provided. HTN medication changed from lisinopril to labetalol  200 mg BID.  Outpatient US ordered for viability.  Message sent to [facility].  Precautions given/reasons to return to MAU.   Pt stable at time of discharge.  ASSESSMENT 1. Chronic hypertension during pregnancy, antepartum   2. Unsure of LMP (last menstrual period) as reason for ultrasound scan     PLAN Discharge home  Allergies as of 04/08/2017      Reactions   Food Swelling, Other (See Comments)   Pt states that she is allergic to peanut butter.   Reaction:  Tongue swelling       Medication List    STOP taking these medications   ibuprofen 600 MG tablet Commonly known as:  ADVIL,MOTRIN   lisinopril 10 MG tablet Commonly known as:  ZESTRIL     TAKE these medications   labetalol 200 MG tablet Commonly known as:  NORMODYNE Take 1 tablet (200 mg total) by mouth 2 (two) times daily.            Discharge Care Instructions        Start     Ordered   04/08/17 0000  labetalol (NORMODYNE) 200 MG tablet  2 times daily    Question:  Supervising Provider  Answer:  Lazaro ArmsURE, LUTHER H   04/08/17 1017   04/08/17 0000  Discharge patient    Question Answer Comment  Discharge disposition 01-Home or Self Care   Discharge patient date 04/08/2017      04/08/17 1017   04/08/17 0000  US OB Comp Less 14 Wks    Question Answer Comment  Reason for exam: unsure LMP, viability   Preferred imaging location? [facility]      04/08/17 1018   04/08/17 0000  US OB Transvaginal    Question Answer Comment  Reason for Exam (SYMPTOM  OR DIAGNOSIS REQUIRED) unsure LMP, viability   Preferred imaging location? [facility]      04/08/17 1018     Follow-up Information    [facility]   Specialty:  Obstetrics and Gynecology Why:  The office will call you with an appointment. Ultrasound will call you also to make an appointment.  Return to MAU as needed for emergencies. Contact information: [address] [place] [id number] [phone]          Sharen CounterLisa Leftwich-Kirby Certified Nurse-Midwife 04/08/2017  10:19 AM

## 2017-04-08 NOTE — MAU Note (Addendum)
Pt had 2 positive HPT's yesterday, wants confirmation. Also concerned about BP, has hx of HTN, has been out of her meds for the last 3 weeks.

## 2017-04-12 ENCOUNTER — Ambulatory Visit (HOSPITAL_BASED_OUTPATIENT_CLINIC_OR_DEPARTMENT_OTHER)
Admission: RE | Admit: 2017-04-12 | Discharge: 2017-04-12 | Disposition: A | Discharge status: Home / Self Care | Payer: Medicaid Other | Source: Ambulatory Visit | Attending: Advanced Practice Midwife | Admitting: Advanced Practice Midwife

## 2017-04-12 ENCOUNTER — Encounter (INDEPENDENT_AMBULATORY_CARE_PROVIDER_SITE_OTHER): Payer: Self-pay

## 2017-04-12 DIAGNOSIS — Z3687 Encounter for antenatal screening for uncertain dates: Secondary | ICD-10-CM

## 2017-04-13 ENCOUNTER — Inpatient Hospital Stay (HOSPITAL_COMMUNITY)
Admission: AD | Admit: 2017-04-13 | Discharge: 2017-04-14 | Disposition: A | Discharge status: Home / Self Care | Payer: Medicaid Other | Source: Ambulatory Visit | Attending: Obstetrics & Gynecology | Admitting: Obstetrics & Gynecology

## 2017-04-13 ENCOUNTER — Encounter (HOSPITAL_COMMUNITY): Payer: Self-pay | Admitting: *Deleted

## 2017-04-13 DIAGNOSIS — O10919 Unspecified pre-existing hypertension complicating pregnancy, unspecified trimester: Secondary | ICD-10-CM

## 2017-04-13 DIAGNOSIS — O10011 Pre-existing essential hypertension complicating pregnancy, first trimester: Secondary | ICD-10-CM | POA: Diagnosis not present

## 2017-04-13 DIAGNOSIS — Z3A10 10 weeks gestation of pregnancy: Secondary | ICD-10-CM | POA: Insufficient documentation

## 2017-04-13 DIAGNOSIS — O26891 Other specified pregnancy related conditions, first trimester: Secondary | ICD-10-CM | POA: Diagnosis not present

## 2017-04-13 DIAGNOSIS — R109 Unspecified abdominal pain: Secondary | ICD-10-CM | POA: Insufficient documentation

## 2017-04-13 DIAGNOSIS — Z711 Person with feared health complaint in whom no diagnosis is made: Secondary | ICD-10-CM

## 2017-04-13 DIAGNOSIS — O3680X0 Pregnancy with inconclusive fetal viability, not applicable or unspecified: Secondary | ICD-10-CM

## 2017-04-13 NOTE — MAU Note (Signed)
I have had abd cramping for 3 days. Had some pink/red spotting Friday night but none today. Have HTN and waiting medicaid to come thru to pick up meds

## 2017-04-14 DIAGNOSIS — O26891 Other specified pregnancy related conditions, first trimester: Secondary | ICD-10-CM

## 2017-04-14 DIAGNOSIS — R109 Unspecified abdominal pain: Secondary | ICD-10-CM | POA: Diagnosis not present

## 2017-04-14 LAB — URINALYSIS, ROUTINE W REFLEX MICROSCOPIC
Bilirubin Urine: NEGATIVE
GLUCOSE, UA: 50 mg/dL — AB
HGB URINE DIPSTICK: NEGATIVE
Ketones, ur: NEGATIVE mg/dL
Leukocytes, UA: NEGATIVE
Nitrite: NEGATIVE
Protein, ur: NEGATIVE mg/dL
SPECIFIC GRAVITY, URINE: 1.025 (ref 1.005–1.030)
pH: 5 (ref 5.0–8.0)

## 2017-04-14 LAB — HCG, QUANTITATIVE, PREGNANCY: HCG, BETA CHAIN, QUANT, S: 7666 m[IU]/mL — AB (ref <5)

## 2017-04-14 MED ORDER — LABETALOL HCL 100 MG PO TABS
200.0000 mg | ORAL_TABLET | Freq: Once | ORAL | Status: AC
  Administered 2017-04-14: 200 mg via ORAL
  Filled 2017-04-14: qty 2

## 2017-04-14 NOTE — Progress Notes (Signed)
J Rasch NP in earlier to discuss d/c plan with pt. WRitten and verbal d/c instructions given and understanding voiced. To F/U at [facility] 04/16/17 for repeat BHCG. REturn to MAU sooner for any concerns

## 2017-04-14 NOTE — MAU Provider Note (Signed)
History     CSN: [id number]  Arrival date and time: 04/13/17 2345   First Provider Initiated Contact with Patient 04/14/17 0009      Chief Complaint  Patient presents with  . Abdominal Cramping  . Vaginal Bleeding   HPI   [name] is 21 y.o. female G2P0101; unknown LMP, here in MAU with abdominal cramping that started yesterday. States this is a desired pregnancy. States the pain is located in her lower abdomen. The pain comes and goes; the pain is a 5/10. She has not taken anything for the pain. Had some spotting after her US on 9/7; however none now.  States she went home after her US and was not told anything about the results, she is worried about the baby.   OB History    Gravida Para Term Preterm AB Living   2 1   1   1    SAB TAB Ectopic Multiple Live Births           1      Past Medical History:  Diagnosis Date  . History of cesarean section 03/03/2016   For severe Preeclampsia. Did not labor.   . History of preterm delivery 03/03/2016   For severe Preeclampsia. Did not labor.   . Hypertension   . Severe preeclampsia 2015    Past Surgical History:  Procedure Laterality Date  . CESAREAN SECTION    . EYE SURGERY    . HERNIA REPAIR      Family History  Problem Relation Age of Onset  . Hypertension Father   . Hypertension Maternal Grandmother   . Hypertension Paternal Grandmother     Social History  Substance Use Topics  . Smoking status: Never Smoker  . Smokeless tobacco: Never Used  . Alcohol use Yes     Comment: social    Allergies:  Allergies  Allergen Reactions  . Food Swelling and Other (See Comments)    Pt states that she is allergic to peanut butter.   Reaction:  Tongue swelling     Prescriptions Prior to Admission  Medication Sig Dispense Refill Last Dose  . labetalol (NORMODYNE) 200 MG tablet Take 1 tablet (200 mg total) by mouth 2 (two) times daily. 60 tablet 3    Results for orders placed or performed during the hospital  encounter of 04/13/17 (from the past 48 hour(s))  Urinalysis, Routine w reflex microscopic     Status: Abnormal   Collection Time: 04/13/17 11:56 PM  Result Value Ref Range   Color, Urine YELLOW YELLOW   APPearance CLEAR CLEAR   Specific Gravity, Urine 1.025 1.005 - 1.030   pH 5.0 5.0 - 8.0   Glucose, UA 50 (A) NEGATIVE mg/dL   Hgb urine dipstick NEGATIVE NEGATIVE   Bilirubin Urine NEGATIVE NEGATIVE   Ketones, ur NEGATIVE NEGATIVE mg/dL   Protein, ur NEGATIVE NEGATIVE mg/dL   Nitrite NEGATIVE NEGATIVE   Leukocytes, UA NEGATIVE NEGATIVE  hCG, quantitative, pregnancy     Status: Abnormal   Collection Time: 04/14/17 12:32 AM  Result Value Ref Range   hCG, Beta Chain, Quant, S 7,666 (H) <5 mIU/mL    Comment:          GEST. AGE      CONC.  (mIU/mL)   <=1 WEEK        5 - 50     2 WEEKS       50 - 500     3 WEEKS  100 - 10,000     4 WEEKS     1,000 - 30,000     5 WEEKS     3,500 - 115,000   6-8 WEEKS     12,000 - 270,000    12 WEEKS     15,000 - 220,000        FEMALE AND NON-PREGNANT FEMALE:     LESS THAN 5 mIU/mL    US Ob Comp Less 14 Wks  Result Date: 04/12/2017 CLINICAL DATA:  Viability in pregnancy EXAM: OBSTETRIC <14 WK [place] AND TRANSVAGINAL OB US TECHNIQUE: Both transabdominal and transvaginal ultrasound examinations were performed for complete evaluation of the gestation as well as the maternal uterus, adnexal regions, and pelvic cul-de-sac. Transvaginal technique was performed to assess early pregnancy. COMPARISON:  None. FINDINGS: Intrauterine gestational sac: Bold tiny hypoechoic area within the endometrium. Difficult to determine if this is a very early gestational sac. Yolk sac:  Not visualized Embryo:  Not visualized Cardiac Activity: Heart Rate:   bpm MSD:   mm    w     d CRL:    mm    w    d                  [place] EDC: Subchorionic hemorrhage:  N/A Maternal uterus/adnexae: No adnexal mass. Trace free fluid in the pelvis. IMPRESSION: Small cystic area within the endometrium,  too small to characterize if this is a early intrauterine gestational sac. Recommend follow-up ultrasound in 10-14 days. Electronically Signed   By: Charlett Nose M.D.   On: 04/12/2017 12:03   US Ob Transvaginal  Result Date: 04/12/2017 CLINICAL DATA:  Viability in pregnancy EXAM: OBSTETRIC <14 WK [place] AND TRANSVAGINAL OB US TECHNIQUE: Both transabdominal and transvaginal ultrasound examinations were performed for complete evaluation of the gestation as well as the maternal uterus, adnexal regions, and pelvic cul-de-sac. Transvaginal technique was performed to assess early pregnancy. COMPARISON:  None. FINDINGS: Intrauterine gestational sac: Bold tiny hypoechoic area within the endometrium. Difficult to determine if this is a very early gestational sac. Yolk sac:  Not visualized Embryo:  Not visualized Cardiac Activity: Heart Rate:   bpm MSD:   mm    w     d CRL:    mm    w    d                  [place] EDC: Subchorionic hemorrhage:  N/A Maternal uterus/adnexae: No adnexal mass. Trace free fluid in the pelvis. IMPRESSION: Small cystic area within the endometrium, too small to characterize if this is a early intrauterine gestational sac. Recommend follow-up ultrasound in 10-14 days. Electronically Signed   By: Charlett Nose M.D.   On: 04/12/2017 12:03     Review of Systems  Constitutional: Negative for fever.  Gastrointestinal: Positive for abdominal pain. Negative for nausea and vomiting.  Genitourinary: Negative for vaginal bleeding.   Physical Exam   Blood pressure (!) 147/93, pulse 84, temperature 98.2 F (36.8 C), resp. rate 18, height  (1.702 m), weight 82.1 kg (181 lb), last menstrual period 02/06/2017, unknown if currently breastfeeding.  Physical Exam  Constitutional: She is oriented to person, place, and time. She appears well-developed and well-nourished. No distress.  HENT:  Head: Normocephalic.  Respiratory: Effort normal.  GI: Soft. She exhibits no distension. There is no tenderness.  There is no rebound.  Genitourinary:  Genitourinary Comments: Bimanual exam: Cervix closed Uterus non tender, normal size Adnexa non  tender, no masses bilaterally Chaperone present for exam.   Musculoskeletal: Normal range of motion.  Neurological: She is alert and oriented to person, place, and time.  Skin: Skin is warm. She is not diaphoretic.  Psychiatric: Her behavior is normal.    MAU Course  Procedures  None  MDM  Labetalol 200 mg given PO  Hcg level drawn today, plan for serial quants and repeat US in 1 week.   Assessment and Plan   A:  Problem List Items Addressed This Visit      Cardiovascular and Mediastinum   Chronic hypertension during pregnancy, antepartum   Relevant Medications   labetalol (NORMODYNE) tablet 200 mg (Completed)    Other Visit Diagnoses    Abdominal pain in pregnancy, first trimester    -  Primary   Physically well but worried       Pregnancy of unknown anatomic location          P:  Discharge home in stable condition Pelvic rest Strict return precautions Ectopic precautions [facility]11 for repeat quant @ 1100 Return to MAU sooner if symptoms worsen   Rasch, Victorino Dike I, NP 04/14/2017 5:50 AM

## 2017-04-14 NOTE — Discharge Instructions (Signed)
Abdominal Pain During Pregnancy Abdominal pain is common in pregnancy. Most of the time, it does not cause harm. There are many causes of abdominal pain. Some causes are more serious than others and sometimes the cause is not known. Abdominal pain can be a sign that something is very wrong with the pregnancy or the pain may have nothing to do with the pregnancy. Always tell your health care provider if you have any abdominal pain. Follow these instructions at home:  Do not have sex or put anything in your vagina until your symptoms go away completely.  Watch your abdominal pain for any changes.  Get plenty of rest until your pain improves.  Drink enough fluid to keep your urine clear or pale yellow.  Take over-the-counter or prescription medicines only as told by your health care provider.  Keep all follow-up visits as told by your health care provider. This is important. Contact a health care provider if:  You have a fever.  Your pain gets worse or you have cramping.  Your pain continues after resting. Get help right away if:  You are bleeding, leaking fluid, or passing tissue from the vagina.  You have vomiting or diarrhea that does not go away.  You have painful or bloody urination.  You notice a decrease in your baby's movements.  You feel very weak or faint.  You have shortness of breath.  You develop a severe headache with abdominal pain.  You have abnormal vaginal discharge with abdominal pain. This information is not intended to replace advice given to you by your health care provider. Make sure you discuss any questions you have with your health care provider. Document Released: 07/23/2005 Document Revised: 05/03/2016 Document Reviewed: 02/19/2013 Elsevier Interactive Patient Education  2018 Elsevier Inc.  Pelvic Rest Pelvic rest may be recommended if:  Your placenta is partially or completely covering the opening of your cervix (placenta previa).  There is  bleeding between the wall of the uterus and the amniotic sac in the first trimester of pregnancy (subchorionic hemorrhage).  You went into labor too early (preterm labor).  Based on your overall health and the health of your baby, your health care provider will decide if pelvic rest is right for you. How do I rest my pelvis? For as long as told by your health care provider:  Do not have sex, sexual stimulation, or an orgasm.  Do not use tampons. Do not douche. Do not put anything in your vagina.  Do not lift anything that is heavier than 10 lb (4.5 kg).  Avoid activities that take a lot of effort (are strenuous).  Avoid any activity in which your pelvic muscles could become strained.  When should I seek medical care? Seek medical care if you have:  Cramping pain in your lower abdomen.  Vaginal discharge.  A low, dull backache.  Regular contractions.  Uterine tightening.  When should I seek immediate medical care? Seek immediate medical care if:  You have vaginal bleeding and you are pregnant.  This information is not intended to replace advice given to you by your health care provider. Make sure you discuss any questions you have with your health care provider. Document Released: 11/17/2010 Document Revised: 12/29/2015 Document Reviewed: 01/24/2015 Elsevier Interactive Patient Education  [employer]2018 Elsevier Inc.

## 2017-04-16 ENCOUNTER — Ambulatory Visit: Payer: Self-pay | Admitting: *Deleted

## 2017-04-16 DIAGNOSIS — O3680X0 Pregnancy with inconclusive fetal viability, not applicable or unspecified: Secondary | ICD-10-CM

## 2017-04-16 LAB — HCG, QUANTITATIVE, PREGNANCY: hCG, Beta Chain, Quant, S: 18643 m[IU]/mL — ABNORMAL HIGH (ref <5)

## 2017-04-16 NOTE — Addendum Note (Signed)
Addended by: Sherre LainASH, Seaver Machia A on: 04/16/2017 01:09 PM   Modules accepted: Level of Service

## 2017-04-16 NOTE — Progress Notes (Signed)
Pt in for stat hcg level. She has pregnancy of unknown anatomic location. Advised her of how stat process works. She reports no pain and no bleeding. She does report some cramping. Discussed results with Dr. Debroah LoopArnold he recommends ultrasound by the end of the week or early next week. Per ultrasound dept they are rescheduling Friday patients so Monday will be next available. Ultrasound scheduled for Monday at 3 pm. Pt voiced understanding and had no further questions or concerns.

## 2017-04-22 ENCOUNTER — Other Ambulatory Visit: Payer: Self-pay | Admitting: Obstetrics & Gynecology

## 2017-04-22 ENCOUNTER — Encounter: Payer: Self-pay | Admitting: Obstetrics & Gynecology

## 2017-04-22 ENCOUNTER — Ambulatory Visit (HOSPITAL_COMMUNITY)
Admission: RE | Admit: 2017-04-22 | Discharge: 2017-04-22 | Disposition: A | Discharge status: Home / Self Care | Payer: Medicaid Other | Source: Ambulatory Visit | Attending: Obstetrics & Gynecology | Admitting: Obstetrics & Gynecology

## 2017-04-22 ENCOUNTER — Ambulatory Visit: Payer: Medicaid Other

## 2017-04-22 DIAGNOSIS — O3680X0 Pregnancy with inconclusive fetal viability, not applicable or unspecified: Secondary | ICD-10-CM

## 2017-04-22 DIAGNOSIS — Z3491 Encounter for supervision of normal pregnancy, unspecified, first trimester: Secondary | ICD-10-CM | POA: Diagnosis not present

## 2017-04-22 DIAGNOSIS — Z3490 Encounter for supervision of normal pregnancy, unspecified, unspecified trimester: Secondary | ICD-10-CM | POA: Diagnosis present

## 2017-04-22 DIAGNOSIS — Z3A01 Less than 8 weeks gestation of pregnancy: Secondary | ICD-10-CM | POA: Diagnosis not present

## 2017-04-22 DIAGNOSIS — Z712 Person consulting for explanation of examination or test findings: Secondary | ICD-10-CM

## 2017-04-22 NOTE — Progress Notes (Signed)
Pt here today for OB US results.  Pt informed of viable pregnancy with a EDD 12/15/17.  Pt advised to get proof of pregnancy letter to start [facility].  Pt given [place] pic.  Pt was excited and relieved to the news.

## 2017-04-30 ENCOUNTER — Ambulatory Visit (INDEPENDENT_AMBULATORY_CARE_PROVIDER_SITE_OTHER): Payer: Medicaid Other | Admitting: Obstetrics & Gynecology

## 2017-04-30 ENCOUNTER — Encounter: Payer: Self-pay | Admitting: *Deleted

## 2017-04-30 ENCOUNTER — Encounter: Payer: Self-pay | Admitting: Obstetrics & Gynecology

## 2017-04-30 ENCOUNTER — Other Ambulatory Visit (HOSPITAL_COMMUNITY)
Admission: RE | Admit: 2017-04-30 | Discharge: 2017-04-30 | Disposition: A | Discharge status: Home / Self Care | Payer: Medicaid Other | Source: Ambulatory Visit | Attending: Obstetrics & Gynecology | Admitting: Obstetrics & Gynecology

## 2017-04-30 DIAGNOSIS — O0990 Supervision of high risk pregnancy, unspecified, unspecified trimester: Secondary | ICD-10-CM | POA: Diagnosis present

## 2017-04-30 DIAGNOSIS — Z331 Pregnant state, incidental: Secondary | ICD-10-CM

## 2017-04-30 DIAGNOSIS — Z113 Encounter for screening for infections with a predominantly sexual mode of transmission: Secondary | ICD-10-CM | POA: Diagnosis not present

## 2017-04-30 DIAGNOSIS — Z3A00 Weeks of gestation of pregnancy not specified: Secondary | ICD-10-CM | POA: Diagnosis not present

## 2017-04-30 DIAGNOSIS — O10919 Unspecified pre-existing hypertension complicating pregnancy, unspecified trimester: Secondary | ICD-10-CM

## 2017-04-30 DIAGNOSIS — O10911 Unspecified pre-existing hypertension complicating pregnancy, first trimester: Secondary | ICD-10-CM

## 2017-04-30 DIAGNOSIS — Z124 Encounter for screening for malignant neoplasm of cervix: Secondary | ICD-10-CM

## 2017-04-30 DIAGNOSIS — O0991 Supervision of high risk pregnancy, unspecified, first trimester: Secondary | ICD-10-CM

## 2017-04-30 LAB — POCT URINALYSIS DIP (DEVICE)
BILIRUBIN URINE: NEGATIVE
GLUCOSE, UA: NEGATIVE mg/dL
Hgb urine dipstick: NEGATIVE
KETONES UR: NEGATIVE mg/dL
Leukocytes, UA: NEGATIVE
NITRITE: NEGATIVE
Protein, ur: NEGATIVE mg/dL
Specific Gravity, Urine: 1.015 (ref 1.005–1.030)
Urobilinogen, UA: 0.2 mg/dL (ref 0.0–1.0)
pH: 6 (ref 5.0–8.0)

## 2017-04-30 MED ORDER — PRENATAL VITAMINS 0.8 MG PO TABS: 1.0000 | ORAL_TABLET | Freq: Every day | ORAL | 12 refills | Status: DC

## 2017-04-30 MED ORDER — LABETALOL HCL 200 MG PO TABS: 300.0000 mg | ORAL_TABLET | Freq: Two times a day (BID) | ORAL | 3 refills | Status: DC

## 2017-04-30 NOTE — Patient Instructions (Addendum)
First Trimester of Pregnancy The first trimester of pregnancy is from week 1 until the end of week 13 (months 1 through 3). A week after a sperm fertilizes an egg, the egg will implant on the wall of the uterus. This embryo will begin to develop into a baby. Genes from you and your partner will form the baby. The female genes will determine whether the baby will be a boy or a girl. At 6-8 weeks, the eyes and face will be formed, and the heartbeat can be seen on ultrasound. At the end of 12 weeks, all the baby's organs will be formed. Now that you are pregnant, you will want to do everything you can to have a healthy baby. Two of the most important things are to get good prenatal care and to follow your health care provider's instructions. Prenatal care is all the medical care you receive before the baby's birth. This care will help prevent, find, and treat any problems during the pregnancy and childbirth. Body changes during your first trimester Your body goes through many changes during pregnancy. The changes vary from woman to woman.  You may gain or lose a couple of pounds at first.  You may feel sick to your stomach (nauseous) and you may throw up (vomit). If the vomiting is uncontrollable, call your health care provider.  You may tire easily.  You may develop headaches that can be relieved by medicines. All medicines should be approved by your health care provider.  You may urinate more often. Painful urination may mean you have a bladder infection.  You may develop heartburn as a result of your pregnancy.  You may develop constipation because certain hormones are causing the muscles that push stool through your intestines to slow down.  You may develop hemorrhoids or swollen veins (varicose veins).  Your breasts may begin to grow larger and become tender. Your nipples may stick out more, and the tissue that surrounds them (areola) may become darker.  Your gums may bleed and may be  sensitive to brushing and flossing.  Dark spots or blotches (chloasma, mask of pregnancy) may develop on your face. This will likely fade after the baby is born.  Your menstrual periods will stop.  You may have a loss of appetite.  You may develop cravings for certain kinds of food.  You may have changes in your emotions from day to day, such as being excited to be pregnant or being concerned that something may go wrong with the pregnancy and baby.  You may have more vivid and strange dreams.  You may have changes in your hair. These can include thickening of your hair, rapid growth, and changes in texture. Some women also have hair loss during or after pregnancy, or hair that feels dry or thin. Your hair will most likely return to normal after your baby is born.  What to expect at prenatal visits During a routine prenatal visit:  You will be weighed to make sure you and the baby are growing normally.  Your blood pressure will be taken.  Your abdomen will be measured to track your baby's growth.  The fetal heartbeat will be listened to between weeks 10 and 14 of your pregnancy.  Test results from any previous visits will be discussed.  Your health care provider may ask you:  How you are feeling.  If you are feeling the baby move.  If you have had any abnormal symptoms, such as leaking fluid, bleeding, severe headaches,   or abdominal cramping.  If you are using any tobacco products, including cigarettes, chewing tobacco, and electronic cigarettes.  If you have any questions.  Other tests that may be performed during your first trimester include:  Blood tests to find your blood type and to check for the presence of any previous infections. The tests will also be used to check for low iron levels (anemia) and protein on red blood cells (Rh antibodies). Depending on your risk factors, or if you previously had diabetes during pregnancy, you may have tests to check for high blood  sugar that affects pregnant women (gestational diabetes).  Urine tests to check for infections, diabetes, or protein in the urine.  An ultrasound to confirm the proper growth and development of the baby.  Fetal screens for spinal cord problems (spina bifida) and Down syndrome.  HIV (human immunodeficiency virus) testing. Routine prenatal testing includes screening for HIV, unless you choose not to have this test.  You may need other tests to make sure you and the baby are doing well.  Follow these instructions at home: Medicines  Follow your health care provider's instructions regarding medicine use. Specific medicines may be either safe or unsafe to take during pregnancy.  Take a prenatal vitamin that contains at least 600 micrograms (mcg) of folic acid.  If you develop constipation, try taking a stool softener if your health care provider approves. Eating and drinking  Eat a balanced diet that includes fresh fruits and vegetables, whole grains, good sources of protein such as meat, eggs, or tofu, and low-fat dairy. Your health care provider will help you determine the amount of weight gain that is right for you.  Avoid raw meat and uncooked cheese. These carry germs that can cause birth defects in the baby.  Eating four or five small meals rather than three large meals a day may help relieve nausea and vomiting. If you start to feel nauseous, eating a few soda crackers can be helpful. Drinking liquids between meals, instead of during meals, also seems to help ease nausea and vomiting.  Limit foods that are high in fat and processed sugars, such as fried and sweet foods.  To prevent constipation: ? Eat foods that are high in fiber, such as fresh fruits and vegetables, whole grains, and beans. ? Drink enough fluid to keep your urine clear or pale yellow. Activity  Exercise only as directed by your health care provider. Most women can continue their usual exercise routine during  pregnancy. Try to exercise for 30 minutes at least 5 days a week. Exercising will help you: ? Control your weight. ? Stay in shape. ? Be prepared for labor and delivery.  Experiencing pain or cramping in the lower abdomen or lower back is a good sign that you should stop exercising. Check with your health care provider before continuing with normal exercises.  Try to avoid standing for long periods of time. Move your legs often if you must stand in one place for a long time.  Avoid heavy lifting.  Wear low-heeled shoes and practice good posture.  You may continue to have sex unless your health care provider tells you not to. Relieving pain and discomfort  Wear a good support bra to relieve breast tenderness.  Take warm sitz baths to soothe any pain or discomfort caused by hemorrhoids. Use hemorrhoid cream if your health care provider approves.  Rest with your legs elevated if you have leg cramps or low back pain.  If you develop   varicose veins in your legs, wear support hose. Elevate your feet for 15 minutes, 3-4 times a day. Limit salt in your diet. Prenatal care  Schedule your prenatal visits by the twelfth week of pregnancy. They are usually scheduled monthly at first, then more often in the last 2 months before delivery.  Write down your questions. Take them to your prenatal visits.  Keep all your prenatal visits as told by your health care provider. This is important. Safety  Wear your seat belt at all times when driving.  Make a list of emergency phone numbers, including numbers for family, friends, the hospital, and police and fire departments. General instructions  Ask your health care provider for a referral to a local prenatal education class. Begin classes no later than the beginning of month 6 of your pregnancy.  Ask for help if you have counseling or nutritional needs during pregnancy. Your health care provider can offer advice or refer you to specialists for help  with various needs.  Do not use hot tubs, steam rooms, or saunas.  Do not douche or use tampons or scented sanitary pads.  Do not cross your legs for long periods of time.  Avoid cat litter boxes and soil used by cats. These carry germs that can cause birth defects in the baby and possibly loss of the fetus by miscarriage or stillbirth.  Avoid all smoking, herbs, alcohol, and medicines not prescribed by your health care provider. Chemicals in these products affect the formation and growth of the baby.  Do not use any products that contain nicotine or tobacco, such as cigarettes and e-cigarettes. If you need help quitting, ask your health care provider. You may receive counseling support and other resources to help you quit.  Schedule a dentist appointment. At home, brush your teeth with a soft toothbrush and be gentle when you floss. Contact a health care provider if:  You have dizziness.  You have mild pelvic cramps, pelvic pressure, or nagging pain in the abdominal area.  You have persistent nausea, vomiting, or diarrhea.  You have a bad smelling vaginal discharge.  You have pain when you urinate.  You notice increased swelling in your face, hands, legs, or ankles.  You are exposed to fifth disease or chickenpox.  You are exposed to German measles (rubella) and have never had it. Get help right away if:  You have a fever.  You are leaking fluid from your vagina.  You have spotting or bleeding from your vagina.  You have severe abdominal cramping or pain.  You have rapid weight gain or loss.  You vomit blood or material that looks like coffee grounds.  You develop a severe headache.  You have shortness of breath.  You have any kind of trauma, such as from a fall or a car accident. Summary  The first trimester of pregnancy is from week 1 until the end of week 13 (months 1 through 3).  Your body goes through many changes during pregnancy. The changes vary from  woman to woman.  You will have routine prenatal visits. During those visits, your health care provider will examine you, discuss any test results you may have, and talk with you about how you are feeling. This information is not intended to replace advice given to you by your health care provider. Make sure you discuss any questions you have with your health care provider. Document Released: 07/17/2001 Document Revised: 07/04/2016 Document Reviewed: 07/04/2016 Elsevier Interactive Patient Education  2017 [employer] Vaginal Birth After Cesarean Delivery Vaginal birth after cesarean delivery (VBAC) is giving birth vaginally after previously delivering a baby by a cesarean. In the past, if a woman had a cesarean delivery, all births afterward would be done by cesarean delivery. This is no longer true. It can be safe for the mother to try a vaginal delivery after having a cesarean delivery. It is important to discuss VBAC with your health care provider early in the pregnancy so you can understand the risks, benefits, and options. It will give you time to decide what is best in your particular case. The final decision about whether to have a VBAC or repeat cesarean delivery should be between you and your health care provider. Any changes in your health or your baby's health during your pregnancy may make it necessary to change your initial decision about VBAC. Women who plan to have a VBAC should check with their health care provider to be sure that:  The previous cesarean delivery was done with a low transverse uterine cut (incision) (not a vertical classical incision).  The birth canal is big enough for the baby.  There were no other operations on the uterus.  An electronic fetal monitor (EFM) will be on at all times during labor.  An operating room will be available and ready in case an emergency cesarean delivery is needed.  A health care provider and surgical nursing staff will be available  at all times during labor to be ready to do an emergency delivery cesarean if necessary.  An anesthesiologist will be present in case an emergency cesarean delivery is needed.  The nursery is prepared and has adequate personnel and necessary equipment available to care for the baby in case of an emergency cesarean delivery. Benefits of VBAC  Shorter stay in the hospital.  Avoidance of risks associated with cesarean delivery, such as: ? Surgical complications, such as opening of the incision or hernia in the incision. ? Injury to other organs. ? Fever. This can occur if an infection develops after surgery. It can also occur as a reaction to the medicine given to make you numb during the surgery.  Less blood loss and need for blood transfusions.  Lower risk of blood clots and infection.  Shorter recovery.  Decreased risk for having to remove the uterus (hysterectomy).  Decreased risk for the placenta to completely or partially cover the opening of the uterus (placenta previa) with a future pregnancy.  Decrease risk in future labor and delivery. Risks of a VBAC  Tearing (rupture) of the uterus. This is occurs in less than 1% of VBACs. The risk of this happening is higher if: ? Steps are taken to begin the labor process (induce labor) or stimulate or strengthen contractions (augment labor). ? Medicine is used to soften (ripen) the cervix.  Having to remove the uterus (hysterectomy) if it ruptures. VBAC should not be done if:  The previous cesarean delivery was done with a vertical (classical) or T-shaped incision or you do not know what kind of incision was made.  You had a ruptured uterus.  You have had certain types of surgery on your uterus, such as removal of uterine fibroids. Ask your health care provider about other types of surgeries that prevent you from having a VBAC.  You have certain medical or childbirth (obstetrical) problems.  There are problems with the  baby.  You have had two previous cesarean deliveries and no vaginal deliveries. Other facts to know about VBAC:  It is safe to have an epidural anesthetic with VBAC.  It is safe to turn the baby from a breech position (attempt an external cephalic version).  It is safe to try a VBAC with twins.  VBAC may not be successful if your baby weights 8.8 lb (4 kg) or more. However, weight predictions are not always accurate and should not be used alone to decide if VBAC is right for you.  There is an increased failure rate if the time between the cesarean delivery and VBAC is less than 19 months.  Your health care provider may advise against a VBAC if you have preeclampsia (high blood pressure, protein in the urine, and swelling of face and extremities).  VBAC is often successful if you previously gave birth vaginally.  VBAC is often successful when the labor starts spontaneously before the due date.  Delivering a baby through a VBAC is similar to having a normal spontaneous vaginal delivery. This information is not intended to replace advice given to you by your health care provider. Make sure you discuss any questions you have with your health care provider. Document Released: 01/13/2007 Document Revised: 12/29/2015 Document Reviewed: 02/19/2013 Elsevier Interactive Patient Education  [employer].

## 2017-04-30 NOTE — Progress Notes (Signed)
  Subjective:    Eileen Miller is a G2P0101 [redacted]w[redacted]d being seen today for her first obstetrical visit.  Her obstetrical history is significant for pre-eclampsia and CHTN and previous cesarean. Patient does intend to breast feed. Pregnancy history fully reviewed.  Patient reports nausea.  Vitals:   04/30/17 1349  BP: (!) 138/102  Pulse: 64  Weight: 175 lb 1.6 oz (79.4 kg)    HISTORY: OB History  Gravida Para Term Preterm AB Living  SAB TAB Ectopic Multiple Live Births          1    # Outcome Date GA Lbr Len/2nd Weight Sex Delivery Anes PTL Lv  2 Current           1 Preterm 04/01/14 [redacted]w[redacted]d  5 lb 10 oz (2.551 kg) M CS-LTranv  N LIV     Complications: Preeclampsia, severe, third trimester     Past Medical History:  Diagnosis Date  . History of cesarean section 03/03/2016   For severe Preeclampsia. Did not labor.   . History of preterm delivery 03/03/2016   For severe Preeclampsia. Did not labor.   . Hypertension   . Severe preeclampsia 2015   Past Surgical History:  Procedure Laterality Date  . CESAREAN SECTION    . EYE SURGERY    . HERNIA REPAIR     Family History  Problem Relation Age of Onset  . Hypertension Father   . Hypertension Maternal Grandmother   . Hypertension Paternal Grandmother      Exam    Uterus:   6-8 week  Pelvic Exam:    Perineum: No Hemorrhoids   Vulva: normal   Vagina:  normal mucosa   pH:     Cervix: no lesions   Adnexa: no mass, fullness, tenderness   Bony Pelvis: average  System: Breast:  normal appearance, no masses or tenderness   Skin: normal coloration and turgor, no rashes    Neurologic: oriented, normal mood   Extremities: normal strength, tone, and muscle mass   HEENT extra ocular movement intact and thyroid without masses   Mouth/Teeth mucous membranes moist, pharynx normal without lesions and dental hygiene good   Neck supple and no masses   Cardiovascular: regular rate and rhythm, no murmurs or gallops   Respiratory:  appears well, vitals normal, no respiratory distress, acyanotic, normal RR, neck free of mass or lymphadenopathy, chest clear, no wheezing, crepitations, rhonchi, normal symmetric air entry   Abdomen: soft, non-tender; bowel sounds normal; no masses,  no organomegaly   Urinary: urethral meatus normal      Assessment:    Pregnancy: [id number] Patient Active Problem List   Diagnosis Date Noted  . Supervision of high risk pregnancy, antepartum 04/30/2017  . Chronic hypertension during pregnancy, antepartum 04/08/2017  . History of cesarean section 03/03/2016  . History of preterm delivery 03/03/2016  . HTN (hypertension) 05/19/2012        Plan:     Initial labs drawn. Prenatal vitamins. Problem list reviewed and updated. Genetic Screening discussed First Screen: ordered.  Ultrasound discussed; fetal survey: 18+ weeks.  Follow up in 2 weeks, BP check after increased dose of Labetalol today 50% of 30 min visit spent on counseling and coordination of care.  States she wants TOLAC   Scheryl Darter 04/30/2017

## 2017-05-01 LAB — PROTEIN / CREATININE RATIO, URINE
Creatinine, Urine: 182.5 mg/dL
PROTEIN/CREAT RATIO: 124 mg/g{creat} (ref 0–200)
Protein, Ur: 22.7 mg/dL

## 2017-05-02 ENCOUNTER — Encounter: Payer: Self-pay | Admitting: *Deleted

## 2017-05-02 LAB — HEMOGLOBINOPATHY EVALUATION
HGB C: 0 %
HGB S: 0 %
HGB VARIANT: 0 %
Hemoglobin A2 Quantitation: 2.5 % (ref 1.8–3.2)
Hemoglobin F Quantitation: 0 % (ref 0.0–2.0)
Hgb A: 97.5 % (ref 96.4–98.8)

## 2017-05-02 LAB — OBSTETRIC PANEL, INCLUDING HIV
Antibody Screen: NEGATIVE
BASOS ABS: 0 10*3/uL (ref 0.0–0.2)
BASOS: 1 %
EOS (ABSOLUTE): 0.1 10*3/uL (ref 0.0–0.4)
Eos: 2 %
HEMATOCRIT: 34.1 % (ref 34.0–46.6)
HEP B S AG: NEGATIVE
HIV Screen 4th Generation wRfx: NONREACTIVE
Hemoglobin: 10.7 g/dL — ABNORMAL LOW (ref 11.1–15.9)
IMMATURE GRANS (ABS): 0 10*3/uL (ref 0.0–0.1)
Immature Granulocytes: 0 %
LYMPHS: 38 %
Lymphocytes Absolute: 1.9 10*3/uL (ref 0.7–3.1)
MCH: 24.4 pg — ABNORMAL LOW (ref 26.6–33.0)
MCHC: 31.4 g/dL — ABNORMAL LOW (ref 31.5–35.7)
MCV: 78 fL — AB (ref 79–97)
MONOCYTES: 6 %
Monocytes Absolute: 0.3 10*3/uL (ref 0.1–0.9)
NEUTROS ABS: 2.8 10*3/uL (ref 1.4–7.0)
Neutrophils: 53 %
PLATELETS: 419 10*3/uL — AB (ref 150–379)
RBC: 4.39 x10E6/uL (ref 3.77–5.28)
RDW: 16.6 % — AB (ref 12.3–15.4)
RPR: NONREACTIVE
Rh Factor: POSITIVE
Rubella Antibodies, IGG: 4.44 index (ref >0.99)
WBC: 5.1 10*3/uL (ref 3.4–10.8)

## 2017-05-02 LAB — CYTOLOGY - PAP
CHLAMYDIA, DNA PROBE: NEGATIVE
DIAGNOSIS: NEGATIVE
Neisseria Gonorrhea: NEGATIVE

## 2017-05-02 LAB — URINE CULTURE, OB REFLEX

## 2017-05-02 LAB — CULTURE, OB URINE

## 2017-05-16 ENCOUNTER — Encounter: Payer: Medicaid Other | Admitting: Obstetrics & Gynecology

## 2017-05-16 ENCOUNTER — Telehealth: Payer: Self-pay | Admitting: Obstetrics & Gynecology

## 2017-05-16 NOTE — Telephone Encounter (Signed)
Called patient about her missed appointment. Got no answer. I will send a No Show letter.

## 2017-05-20 ENCOUNTER — Ambulatory Visit (INDEPENDENT_AMBULATORY_CARE_PROVIDER_SITE_OTHER): Payer: Medicaid Other | Admitting: Family Medicine

## 2017-05-20 VITALS — BP 156/108 | HR 69 | Wt 173.0 lb

## 2017-05-20 DIAGNOSIS — O10919 Unspecified pre-existing hypertension complicating pregnancy, unspecified trimester: Secondary | ICD-10-CM

## 2017-05-20 DIAGNOSIS — Z98891 History of uterine scar from previous surgery: Secondary | ICD-10-CM

## 2017-05-20 DIAGNOSIS — O0990 Supervision of high risk pregnancy, unspecified, unspecified trimester: Secondary | ICD-10-CM

## 2017-05-20 DIAGNOSIS — D508 Other iron deficiency anemias: Secondary | ICD-10-CM

## 2017-05-20 DIAGNOSIS — D649 Anemia, unspecified: Secondary | ICD-10-CM | POA: Insufficient documentation

## 2017-05-20 DIAGNOSIS — Z8751 Personal history of pre-term labor: Secondary | ICD-10-CM

## 2017-05-20 MED ORDER — FERROUS SULFATE 325 (65 FE) MG PO TABS
325.0000 mg | ORAL_TABLET | Freq: Two times a day (BID) | ORAL | 11 refills | Status: DC
Start: 2017-05-20 — End: 2018-08-22

## 2017-05-20 MED ORDER — ASPIRIN EC 81 MG PO TBEC: 81.0000 mg | DELAYED_RELEASE_TABLET | Freq: Every day | ORAL | 2 refills | Status: DC

## 2017-05-20 NOTE — Progress Notes (Signed)
Discussed with patient re her meds. States medicaid is straightened out but she did not have the money; but does now and will get it today. Called her pharmacy and they have the rx and will get it ready for her today.

## 2017-05-20 NOTE — Progress Notes (Signed)
Pt stated have not taking the Rx labetalol due to medicaid is not cover....last taken Rx labetalol last OV

## 2017-05-20 NOTE — Progress Notes (Signed)
   PRENATAL VISIT NOTE  Subjective:  Eileen Miller is a 21 y.o. G2P0101 at 9w1dbeing seen today for ongoing prenatal care.  She is currently monitored for the following issues for this high-risk pregnancy and has HTN (hypertension); History of cesarean section; History of preterm delivery; Chronic hypertension during pregnancy, antepartum; Supervision of high risk pregnancy, antepartum; and Anemia on her problem list.  Patient reports no complaints.  Contractions: Not present. Vag. Bleeding: None.  Movement: Present. Denies leaking of fluid.   The following portions of the patient's history were reviewed and updated as appropriate: allergies, current medications, past family history, past medical history, past social history, past surgical history and problem list. Problem list updated.  Objective:   Vitals:   05/20/17 0848  BP: (!) 156/108  Pulse: 69  Weight: 173 lb (78.5 kg)    Fetal Status: Fetal Heart Rate (bpm): 123   Movement: Present     General:  Alert, oriented and cooperative. Patient is in no acute distress.  Skin: Skin is warm and dry. No rash noted.   Cardiovascular: Normal heart rate noted  Respiratory: Normal respiratory effort, no problems with respiration noted  Abdomen: Soft, gravid, appropriate for gestational age.  Pain/Pressure: Absent     Pelvic: Cervical exam deferred        Extremities: Normal range of motion.  Edema: None  Mental Status:  Normal mood and affect. Normal behavior. Normal judgment and thought content.   Assessment and Plan:  Pregnancy: G2P0101 at 133w1d1. Supervision of high risk pregnancy, antepartum First screen on 11/5.  2. Chronic hypertension during pregnancy, antepartum Reviewed tests - missing CMP. Will get today. ASA '81mg'$  - Comp Met (CMET)  3. History of preterm delivery Induced preterm secondary to preeclampsia  4. History of cesarean section Desires TOLAC  5. Other iron deficiency anemia Ferrous sulfate  Preterm  labor symptoms and general obstetric precautions including but not limited to vaginal bleeding, contractions, leaking of fluid and fetal movement were reviewed in detail with the patient. Please refer to After Visit Summary for other counseling recommendations.  Return in about 4 weeks (around 06/17/2017) for OB f/u.   JaTruett MainlandDO

## 2017-05-21 LAB — COMPREHENSIVE METABOLIC PANEL
A/G RATIO: 1.4 (ref 1.2–2.2)
ALBUMIN: 4.2 g/dL (ref 3.5–5.5)
ALK PHOS: 43 IU/L (ref 39–117)
ALT: 6 IU/L (ref 0–32)
AST: 13 IU/L (ref 0–40)
BILIRUBIN TOTAL: 0.4 mg/dL (ref 0.0–1.2)
BUN / CREAT RATIO: 9 (ref 9–23)
BUN: 6 mg/dL (ref 6–20)
CHLORIDE: 102 mmol/L (ref 96–106)
CO2: 22 mmol/L (ref 20–29)
Calcium: 9.7 mg/dL (ref 8.7–10.2)
Creatinine, Ser: 0.66 mg/dL (ref 0.57–1.00)
GFR calc Af Amer: 144 mL/min/{1.73_m2} (ref >59)
GFR calc non Af Amer: 125 mL/min/{1.73_m2} (ref >59)
GLOBULIN, TOTAL: 2.9 g/dL (ref 1.5–4.5)
Glucose: 86 mg/dL (ref 65–99)
Potassium: 4.1 mmol/L (ref 3.5–5.2)
SODIUM: 139 mmol/L (ref 134–144)
Total Protein: 7.1 g/dL (ref 6.0–8.5)

## 2017-05-23 LAB — COMPREHENSIVE METABOLIC PANEL
A/G RATIO: 1.4 (ref 1.2–2.2)
ALT: 5 IU/L (ref 0–32)
AST: 16 IU/L (ref 0–40)
Albumin: 4.3 g/dL (ref 3.5–5.5)
Alkaline Phosphatase: 40 IU/L (ref 39–117)
BILIRUBIN TOTAL: 0.4 mg/dL (ref 0.0–1.2)
BUN/Creatinine Ratio: 10 (ref 9–23)
BUN: 8 mg/dL (ref 6–20)
CALCIUM: 9.5 mg/dL (ref 8.7–10.2)
CHLORIDE: 100 mmol/L (ref 96–106)
CO2: 19 mmol/L — ABNORMAL LOW (ref 20–29)
Creatinine, Ser: 0.77 mg/dL (ref 0.57–1.00)
GFR calc non Af Amer: 109 mL/min/{1.73_m2} (ref >59)
GFR, EST AFRICAN AMERICAN: 126 mL/min/{1.73_m2} (ref >59)
GLUCOSE: 70 mg/dL (ref 65–99)
Globulin, Total: 3 g/dL (ref 1.5–4.5)
POTASSIUM: 3.9 mmol/L (ref 3.5–5.2)
Sodium: 137 mmol/L (ref 134–144)
TOTAL PROTEIN: 7.3 g/dL (ref 6.0–8.5)

## 2017-05-23 LAB — SPECIMEN STATUS REPORT

## 2017-05-27 ENCOUNTER — Inpatient Hospital Stay (HOSPITAL_COMMUNITY)
Admission: AD | Admit: 2017-05-27 | Discharge: 2017-05-27 | Disposition: A | Discharge status: Home / Self Care | Payer: Medicaid Other | Source: Ambulatory Visit | Attending: Obstetrics and Gynecology | Admitting: Obstetrics and Gynecology

## 2017-05-27 ENCOUNTER — Encounter (HOSPITAL_COMMUNITY): Payer: Self-pay | Admitting: *Deleted

## 2017-05-27 DIAGNOSIS — Z3A11 11 weeks gestation of pregnancy: Secondary | ICD-10-CM | POA: Insufficient documentation

## 2017-05-27 DIAGNOSIS — B9689 Other specified bacterial agents as the cause of diseases classified elsewhere: Secondary | ICD-10-CM | POA: Diagnosis not present

## 2017-05-27 DIAGNOSIS — O209 Hemorrhage in early pregnancy, unspecified: Secondary | ICD-10-CM | POA: Diagnosis present

## 2017-05-27 DIAGNOSIS — O10011 Pre-existing essential hypertension complicating pregnancy, first trimester: Secondary | ICD-10-CM | POA: Insufficient documentation

## 2017-05-27 DIAGNOSIS — E876 Hypokalemia: Secondary | ICD-10-CM | POA: Diagnosis not present

## 2017-05-27 DIAGNOSIS — Z7982 Long term (current) use of aspirin: Secondary | ICD-10-CM | POA: Diagnosis not present

## 2017-05-27 DIAGNOSIS — R109 Unspecified abdominal pain: Secondary | ICD-10-CM | POA: Diagnosis not present

## 2017-05-27 DIAGNOSIS — O34211 Maternal care for low transverse scar from previous cesarean delivery: Secondary | ICD-10-CM | POA: Insufficient documentation

## 2017-05-27 DIAGNOSIS — O99019 Anemia complicating pregnancy, unspecified trimester: Secondary | ICD-10-CM

## 2017-05-27 DIAGNOSIS — O23591 Infection of other part of genital tract in pregnancy, first trimester: Secondary | ICD-10-CM | POA: Insufficient documentation

## 2017-05-27 DIAGNOSIS — N760 Acute vaginitis: Secondary | ICD-10-CM

## 2017-05-27 DIAGNOSIS — O10911 Unspecified pre-existing hypertension complicating pregnancy, first trimester: Secondary | ICD-10-CM | POA: Diagnosis not present

## 2017-05-27 DIAGNOSIS — Z3491 Encounter for supervision of normal pregnancy, unspecified, first trimester: Secondary | ICD-10-CM

## 2017-05-27 DIAGNOSIS — O10919 Unspecified pre-existing hypertension complicating pregnancy, unspecified trimester: Secondary | ICD-10-CM

## 2017-05-27 DIAGNOSIS — E860 Dehydration: Secondary | ICD-10-CM

## 2017-05-27 DIAGNOSIS — O0990 Supervision of high risk pregnancy, unspecified, unspecified trimester: Secondary | ICD-10-CM

## 2017-05-27 DIAGNOSIS — O26891 Other specified pregnancy related conditions, first trimester: Secondary | ICD-10-CM

## 2017-05-27 LAB — COMPREHENSIVE METABOLIC PANEL
ALBUMIN: 3.5 g/dL (ref 3.5–5.0)
ALT: 8 U/L — AB (ref 14–54)
AST: 16 U/L (ref 15–41)
Alkaline Phosphatase: 36 U/L — ABNORMAL LOW (ref 38–126)
Anion gap: 8 (ref 5–15)
BUN: 10 mg/dL (ref 6–20)
CHLORIDE: 103 mmol/L (ref 101–111)
CO2: 23 mmol/L (ref 22–32)
CREATININE: 0.64 mg/dL (ref 0.44–1.00)
Calcium: 8.9 mg/dL (ref 8.9–10.3)
GFR calc Af Amer: >60 mL/min (ref >60)
GFR calc non Af Amer: >60 mL/min (ref >60)
Glucose, Bld: 96 mg/dL (ref 65–99)
Potassium: 3 mmol/L — ABNORMAL LOW (ref 3.5–5.1)
SODIUM: 134 mmol/L — AB (ref 135–145)
Total Bilirubin: 0.4 mg/dL (ref 0.3–1.2)
Total Protein: 7.1 g/dL (ref 6.5–8.1)

## 2017-05-27 LAB — PROTEIN / CREATININE RATIO, URINE
Creatinine, Urine: 456 mg/dL
Protein Creatinine Ratio: 0.07 mg/mg{Cre} (ref 0.00–0.15)
Total Protein, Urine: 31 mg/dL

## 2017-05-27 LAB — URINALYSIS, ROUTINE W REFLEX MICROSCOPIC
GLUCOSE, UA: NEGATIVE mg/dL
HGB URINE DIPSTICK: NEGATIVE
KETONES UR: 40 mg/dL — AB
Leukocytes, UA: NEGATIVE
Nitrite: NEGATIVE
PROTEIN: 30 mg/dL — AB
Specific Gravity, Urine: >1.03 — ABNORMAL HIGH (ref 1.005–1.030)
pH: 5.5 (ref 5.0–8.0)

## 2017-05-27 LAB — CBC
HCT: 27.5 % — ABNORMAL LOW (ref 36.0–46.0)
Hemoglobin: 9.4 g/dL — ABNORMAL LOW (ref 12.0–15.0)
MCH: 25.9 pg — ABNORMAL LOW (ref 26.0–34.0)
MCHC: 34.2 g/dL (ref 30.0–36.0)
MCV: 75.8 fL — ABNORMAL LOW (ref 78.0–100.0)
PLATELETS: 323 10*3/uL (ref 150–400)
RBC: 3.63 MIL/uL — AB (ref 3.87–5.11)
RDW: 15.6 % — ABNORMAL HIGH (ref 11.5–15.5)
WBC: 5.7 10*3/uL (ref 4.0–10.5)

## 2017-05-27 LAB — URINALYSIS, MICROSCOPIC (REFLEX)

## 2017-05-27 LAB — WET PREP, GENITAL
Sperm: NONE SEEN
Trich, Wet Prep: NONE SEEN
Yeast Wet Prep HPF POC: NONE SEEN

## 2017-05-27 MED ORDER — LABETALOL HCL 200 MG PO TABS: 300.0000 mg | ORAL_TABLET | Freq: Three times a day (TID) | ORAL | 3 refills | Status: DC

## 2017-05-27 MED ORDER — POTASSIUM CHLORIDE ER 10 MEQ PO TBCR: 20.0000 meq | EXTENDED_RELEASE_TABLET | Freq: Every day | ORAL | 0 refills | Status: DC

## 2017-05-27 MED ORDER — ACETAMINOPHEN 500 MG PO TABS
1000.0000 mg | ORAL_TABLET | Freq: Once | ORAL | Status: AC
  Administered 2017-05-27: 1000 mg via ORAL
  Filled 2017-05-27: qty 2

## 2017-05-27 MED ORDER — METRONIDAZOLE 500 MG PO TABS: 500.0000 mg | ORAL_TABLET | Freq: Two times a day (BID) | ORAL | 0 refills | Status: DC

## 2017-05-27 NOTE — MAU Note (Signed)
Patient reports having a headache today she rates a 5/10 "and I didn't know if it was related to the new blood pressure medicine I just started." Also c/o lower abdominal cramping she rates a 5/10 that started today with some dark brown nickle sized spotting when using the bathroom.  Patient reports sex last night.  Not bleeding now, has not taken anything for pain.

## 2017-05-27 NOTE — MAU Provider Note (Signed)
History     CSN: [id number]  Arrival date and time: 05/27/17 1411   First Provider Initiated Contact with Patient 05/27/17 1520      Chief Complaint  Patient presents with  . Abdominal Pain  . Headache  . Vaginal Bleeding   HPI   Ms.Amera Miller is 21 y.o. female with a history of chronic HTN on labetalol, G2P0101 @ 6w1dhere in MAU with complaints of vaginal bleeding. Patient states she used the bathroom and then noted 3 small clots in the toilet. States she had some mild cramping during the time she saw the blood, however that has subsided. The cramping was in her lower abdomen on both sides. States she had intercourse last night, however did not notice any bleeding immediately following.  States she is concerned about the baby and would like to make sure everything is ok. States she is taking labetalol 300 mg BID; last dose this morning. States she is being seen in the high risk office.  Denies N/V, however states she does have decreased appetite. States she does not drink enough water throughout the day.   OB History    Gravida Para Term Preterm AB Living   2 1   1   1    SAB TAB Ectopic Multiple Live Births           1      Past Medical History:  Diagnosis Date  . History of cesarean section 03/03/2016   For severe Preeclampsia. Did not labor.   . History of preterm delivery 03/03/2016   For severe Preeclampsia. Did not labor.   . Hypertension   . Severe preeclampsia 2015    Past Surgical History:  Procedure Laterality Date  . CESAREAN SECTION    . EYE SURGERY    . HERNIA REPAIR      Family History  Problem Relation Age of Onset  . Hypertension Father   . Hypertension Maternal Grandmother   . Hypertension Paternal Grandmother     Social History  Substance Use Topics  . Smoking status: Never Smoker  . Smokeless tobacco: Never Used  . Alcohol use No    Allergies:  Allergies  Allergen Reactions  . Food Swelling and Other (See Comments)    Pt states  that she is allergic to peanut butter.   Reaction:  Tongue swelling     Prescriptions Prior to Admission  Medication Sig Dispense Refill Last Dose  . acetaminophen (TYLENOL) 325 MG tablet Take 650 mg by mouth every 6 (six) hours as needed for headache.   05/26/2017 at Unknown time  . aspirin EC 81 MG tablet Take 1 tablet (81 mg total) by mouth daily. Take after 12 weeks for prevention of preeclampsia later in pregnancy 300 tablet 2 05/26/2017 at Unknown time  . ferrous sulfate (FERROUSUL) 325 (65 FE) MG tablet Take 1 tablet (325 mg total) by mouth 2 (two) times daily. 60 tablet 11 05/27/2017 at Unknown time  . labetalol (NORMODYNE) 200 MG tablet Take 1.5 tablets (300 mg total) by mouth 2 (two) times daily. 90 tablet 3 05/27/2017 at 0800  . Prenatal Multivit-Min-Fe-FA (PRENATAL VITAMINS) 0.8 MG tablet Take 1 tablet by mouth daily. 30 tablet 12 05/25/2017   Results for orders placed or performed during the hospital encounter of 05/27/17 (from the past 48 hour(s))  Urinalysis, Routine w reflex microscopic     Status: Abnormal   Collection Time: 05/27/17  2:35 PM  Result Value Ref Range   Color, Urine YELLOW  YELLOW   APPearance CLOUDY (A) CLEAR   Specific Gravity, Urine >1.030 (H) 1.005 - 1.030   pH 5.5 5.0 - 8.0   Glucose, UA NEGATIVE NEGATIVE mg/dL   Hgb urine dipstick NEGATIVE NEGATIVE   Bilirubin Urine SMALL (A) NEGATIVE   Ketones, ur 40 (A) NEGATIVE mg/dL   Protein, ur 30 (A) NEGATIVE mg/dL   Nitrite NEGATIVE NEGATIVE   Leukocytes, UA NEGATIVE NEGATIVE  Urinalysis, Microscopic (reflex)     Status: Abnormal   Collection Time: 05/27/17  2:35 PM  Result Value Ref Range   RBC / HPF 0-5 0 - 5 RBC/hpf   WBC, UA 0-5 0 - 5 WBC/hpf   Bacteria, UA MANY (A) NONE SEEN   Squamous Epithelial / LPF 6-30 (A) NONE SEEN   Mucus PRESENT   Protein / creatinine ratio, urine     Status: None   Collection Time: 05/27/17  2:35 PM  Result Value Ref Range   Creatinine, Urine 456.00 mg/dL    Comment:  RESULTS CONFIRMED BY MANUAL DILUTION   Total Protein, Urine 31 mg/dL    Comment: NO NORMAL RANGE ESTABLISHED FOR THIS TEST   Protein Creatinine Ratio 0.07 0.00 - 0.15 mg/mg[Cre]  Wet prep, genital     Status: Abnormal   Collection Time: 05/27/17  4:30 PM  Result Value Ref Range   Yeast Wet Prep HPF POC NONE SEEN NONE SEEN   Trich, Wet Prep NONE SEEN NONE SEEN   Clue Cells Wet Prep HPF POC PRESENT (A) NONE SEEN   WBC, Wet Prep HPF POC FEW (A) NONE SEEN    Comment: MANY BACTERIA SEEN   Sperm NONE SEEN   CBC     Status: Abnormal   Collection Time: 05/27/17  4:49 PM  Result Value Ref Range   WBC 5.7 4.0 - 10.5 K/uL   RBC 3.63 (L) 3.87 - 5.11 MIL/uL   Hemoglobin 9.4 (L) 12.0 - 15.0 g/dL   HCT 27.5 (L) 36.0 - 46.0 %   MCV 75.8 (L) 78.0 - 100.0 fL   MCH 25.9 (L) 26.0 - 34.0 pg   MCHC 34.2 30.0 - 36.0 g/dL   RDW 15.6 (H) 11.5 - 15.5 %   Platelets 323 150 - 400 K/uL  Comprehensive metabolic panel     Status: Abnormal   Collection Time: 05/27/17  4:49 PM  Result Value Ref Range   Sodium 134 (L) 135 - 145 mmol/L   Potassium 3.0 (L) 3.5 - 5.1 mmol/L   Chloride 103 101 - 111 mmol/L   CO2 23 22 - 32 mmol/L   Glucose, Bld 96 65 - 99 mg/dL   BUN 10 6 - 20 mg/dL   Creatinine, Ser 0.64 0.44 - 1.00 mg/dL   Calcium 8.9 8.9 - 10.3 mg/dL   Total Protein 7.1 6.5 - 8.1 g/dL   Albumin 3.5 3.5 - 5.0 g/dL   AST 16 15 - 41 U/L   ALT 8 (L) 14 - 54 U/L   Alkaline Phosphatase 36 (L) 38 - 126 U/L   Total Bilirubin 0.4 0.3 - 1.2 mg/dL   GFR calc non Af Amer >60 >60 mL/min   GFR calc Af Amer >60 >60 mL/min    Comment: (NOTE) The eGFR has been calculated using the CKD EPI equation. This calculation has not been validated in all clinical situations. eGFR's persistently <60 mL/min signify possible Chronic Kidney Disease.    Anion gap 8 5 - 15     Review of Systems  Eyes: Negative  for photophobia.  Gastrointestinal: Negative for abdominal pain.  Genitourinary: Positive for vaginal discharge.  Negative for vaginal bleeding.  Neurological: Negative for headaches.   Physical Exam   Blood pressure (!) 136/92, pulse 86, temperature 98.2 F (36.8 C), temperature source Oral, resp. rate 16, height 5' 7"  (1.702 m), weight 78.2 kg (172 lb 8 oz), last menstrual period 02/06/2017, SpO2 100 %, unknown if currently breastfeeding.   Patient Vitals for the past 24 hrs:  BP Temp Temp src Pulse Resp SpO2 Height Weight  05/27/17 1745 (!) 122/94 98.2 F (36.8 C) Oral 73 16 - - -  05/27/17 1730 (!) 141/95 - - 64 - - - -  05/27/17 1715 (!) 137/96 - - 65 - - - -  05/27/17 1700 (!) 138/101 - - 67 - - - -  05/27/17 1640 (!) 142/97 - - 82 - - - -  05/27/17 1442 (!) 136/92 98.2 F (36.8 C) Oral 86 16 100 % - -  05/27/17 1439 - - - - - - 5' 7"  (1.702 m) 78.2 kg (172 lb 8 oz)    Physical Exam  Constitutional: She is oriented to person, place, and time. She appears well-developed and well-nourished. No distress.  HENT:  Head: Normocephalic.  Eyes: Pupils are equal, round, and reactive to light.  Respiratory: Effort normal.  GI: Soft. She exhibits no distension. There is no tenderness. There is no rebound and no guarding.  Genitourinary: Vaginal discharge found.  Genitourinary Comments: Vagina - Small-moderate amount of white vaginal discharge, + odor  Cervix - No contact bleeding, no active bleeding  Bimanual exam: Cervix closed Uterus non tender, enlarged  Adnexa non tender, no masses bilaterally GC/Chlam, wet prep done Chaperone present for exam.   Musculoskeletal: Normal range of motion.  Neurological: She is alert and oriented to person, place, and time.  Skin: Skin is warm. She is not diaphoretic.  Psychiatric: Her behavior is normal.   MAU Course  Procedures None  MDM  + fetal heart tones via doppler O positive blood type CBC, CMP & PCR Wet prep and GC   Assessment and Plan   A:  1. Hypokalemia   2. Chronic hypertension during pregnancy, antepartum   3. Mild  dehydration   4. BV (bacterial vaginosis)   5. Abdominal pain during pregnancy in first trimester   6. Presence of fetal heart sounds in first trimester   7. Supervision of high risk pregnancy, antepartum       P:  Discharge home with strict return precautions  Rx:. Treat with Flagyl, 500 mg BID for 7 Days Increase Labetalol from 300 mg BID to 300 mg TID. Advised patient to start taking 81 mg of aspirin QD starting @ [redacted] weeks gestation  Increase Iron Supplementation to BID Advised patient to increase intake of Vitamin C. 20 meq of Potassium Chloride (KDUR) Orally for 7 days. Advised patient to research potassium rich foods to supplement daily diet. Scheduled follow up in [place] for 05/30/17 @ 0800 for blood pressure and symptom check/follow-up. Advised patient to increase intake of water (6-8 glasses per day).   Lezlie Lye, NP 05/27/2017 6:26 PM

## 2017-05-27 NOTE — Discharge Instructions (Signed)

## 2017-05-27 NOTE — MAU Provider Note (Signed)
History     CSN: [id number]  Arrival date and time: 05/27/17 1411   First Provider Initiated Contact with Patient 05/27/17 1520      Chief Complaint  Patient presents with  . Abdominal Pain  . Headache  . Vaginal Bleeding   Ms. Eileen Lollingngram is a 21 year old AA female with a past medical history significant for chronic hypertension, preeclampsia, and anemia who presents to the MAU with "three blood clots in toilet after peeing". Patient states she has had some mild to moderate cramping today (4/10) that is similar to the cramping she has with her normal cycles. Patient says the clots were darker in color and she denies any current ongoing vaginal bleeding. Came to MAU out of concern for fetus and reports she feels much better after hearing the heartbeat today. She reports having a headache yesterday that was "like a migraine" that resolved with bedrest. She also reports having felt lightheaded yesterday around the same time as the headache. Patient reports having intercourse last evening with boyfriend (monogamous, unprotected) and denies dyspareunia, insertion of foreign objects, or post-coital bleeding. Patient recently startled taking Labetalol (300 mg BID) for her chronic hypertension. Father has history of hypertension. Patient states she has had thyroid (TSH) testing in the past and were negative.   Patient denies chest pain, shortness of breath, palpitations, nausea, vomiting, diarrhea, constipation, sensitivity to hot or cold, epistaxis, bruising, msk aches, or paresthesias. Patient denies history of tobacco usage, alcohol usage, or illicit drug usage.     OB History    Gravida Para Term Preterm AB Living   2 1   1   1    SAB TAB Ectopic Multiple Live Births           1      Past Medical History:  Diagnosis Date  . History of cesarean section 03/03/2016   For severe Preeclampsia. Did not labor.   . History of preterm delivery 03/03/2016   For severe Preeclampsia. Did not labor.   .  Hypertension   . Severe preeclampsia 2015    Past Surgical History:  Procedure Laterality Date  . CESAREAN SECTION    . EYE SURGERY    . HERNIA REPAIR      Family History  Problem Relation Age of Onset  . Hypertension Father   . Hypertension Maternal Grandmother   . Hypertension Paternal Grandmother     Social History  Substance Use Topics  . Smoking status: Never Smoker  . Smokeless tobacco: Never Used  . Alcohol use No    Allergies:  Allergies  Allergen Reactions  . Food Swelling and Other (See Comments)    Pt states that she is allergic to peanut butter.   Reaction:  Tongue swelling     Prescriptions Prior to Admission  Medication Sig Dispense Refill Last Dose  . acetaminophen (TYLENOL) 325 MG tablet Take 650 mg by mouth every 6 (six) hours as needed for headache.   05/26/2017 at Unknown time  . aspirin EC 81 MG tablet Take 1 tablet (81 mg total) by mouth daily. Take after 12 weeks for prevention of preeclampsia later in pregnancy 300 tablet 2 05/26/2017 at Unknown time  . ferrous sulfate (FERROUSUL) 325 (65 FE) MG tablet Take 1 tablet (325 mg total) by mouth 2 (two) times daily. 60 tablet 11 05/27/2017 at Unknown time  . labetalol (NORMODYNE) 200 MG tablet Take 1.5 tablets (300 mg total) by mouth 2 (two) times daily. 90 tablet 3 05/27/2017 at 0800  .  Prenatal Multivit-Min-Fe-FA (PRENATAL VITAMINS) 0.8 MG tablet Take 1 tablet by mouth daily. 30 tablet 12 05/25/2017    Review of Systems Physical Exam   Blood pressure (!) 136/92, pulse 86, temperature 98.2 F (36.8 C), temperature source Oral, resp. rate 16, height 5\' 7"  (1.702 m), weight 78.2 kg (172 lb 8 oz), last menstrual period 02/06/2017, SpO2 100 %, unknown if currently breastfeeding.  Physical Exam  Constitutional: She is oriented to person, place, and time. She appears well-developed and well-nourished.  HENT:  Head: Normocephalic and atraumatic.  Cardiovascular: Normal rate and regular rhythm.  Exam  reveals no gallop and no friction rub.   No murmur heard. Respiratory: Effort normal and breath sounds normal. No respiratory distress. She has no wheezes. She has no rales. She exhibits no tenderness.  GI: Soft. Bowel sounds are normal. She exhibits no mass. There is tenderness (Mild tenderness to palpation in RLQ). There is no guarding.  Musculoskeletal: She exhibits no edema or tenderness.  Neurological: She is alert and oriented to person, place, and time.  Skin: Skin is warm. No rash noted.   Pelvic exam: Labia are pink, non erythematous. No indications of bleeding externally. Mild odor. Plastic speculum with lubrication inserted without complication. Cervix visualized with copious milky, white discharge. Specimens collected (2 swabs) and sent for analysis. Vaginal mucosa visualized with speculum removal. Mucosa is pink. No lesions visualized. On manual exam, cervix is soft and non-tender to movement. Mild adnexal tenderness on right side. No mass detected. Patient affirmed tenderness is similar to to level discovered on RLQ exam.  Results for orders placed or performed during the hospital encounter of 05/27/17 (from the past 24 hour(s))  Urinalysis, Routine w reflex microscopic     Status: Abnormal   Collection Time: 05/27/17  2:35 PM  Result Value Ref Range   Color, Urine YELLOW YELLOW   APPearance CLOUDY (A) CLEAR   Specific Gravity, Urine >1.030 (H) 1.005 - 1.030   pH 5.5 5.0 - 8.0   Glucose, UA NEGATIVE NEGATIVE mg/dL   Hgb urine dipstick NEGATIVE NEGATIVE   Bilirubin Urine SMALL (A) NEGATIVE   Ketones, ur 40 (A) NEGATIVE mg/dL   Protein, ur 30 (A) NEGATIVE mg/dL   Nitrite NEGATIVE NEGATIVE   Leukocytes, UA NEGATIVE NEGATIVE  Urinalysis, Microscopic (reflex)     Status: Abnormal   Collection Time: 05/27/17  2:35 PM  Result Value Ref Range   RBC / HPF 0-5 0 - 5 RBC/hpf   WBC, UA 0-5 0 - 5 WBC/hpf   Bacteria, UA MANY (A) NONE SEEN   Squamous Epithelial / LPF 6-30 (A) NONE SEEN    Mucus PRESENT   Protein / creatinine ratio, urine     Status: None   Collection Time: 05/27/17  2:35 PM  Result Value Ref Range   Creatinine, Urine 456.00 mg/dL   Total Protein, Urine 31 mg/dL   Protein Creatinine Ratio 0.07 0.00 - 0.15 mg/mg[Cre]  Wet prep, genital     Status: Abnormal   Collection Time: 05/27/17  4:30 PM  Result Value Ref Range   Yeast Wet Prep HPF POC NONE SEEN NONE SEEN   Trich, Wet Prep NONE SEEN NONE SEEN   Clue Cells Wet Prep HPF POC PRESENT (A) NONE SEEN   WBC, Wet Prep HPF POC FEW (A) NONE SEEN   Sperm NONE SEEN   CBC     Status: Abnormal   Collection Time: 05/27/17  4:49 PM  Result Value Ref Range   WBC 5.7  4.0 - 10.5 K/uL   RBC 3.63 (L) 3.87 - 5.11 MIL/uL   Hemoglobin 9.4 (L) 12.0 - 15.0 g/dL   HCT 40.9 (L) 81.1 - 91.4 %   MCV 75.8 (L) 78.0 - 100.0 fL   MCH 25.9 (L) 26.0 - 34.0 pg   MCHC 34.2 30.0 - 36.0 g/dL   RDW 78.2 (H) 95.6 - 21.3 %   Platelets 323 150 - 400 K/uL  Comprehensive metabolic panel     Status: Abnormal   Collection Time: 05/27/17  4:49 PM  Result Value Ref Range   Sodium 134 (L) 135 - 145 mmol/L   Potassium 3.0 (L) 3.5 - 5.1 mmol/L   Chloride 103 101 - 111 mmol/L   CO2 23 22 - 32 mmol/L   Glucose, Bld 96 65 - 99 mg/dL   BUN 10 6 - 20 mg/dL   Creatinine, Ser 0.86 0.44 - 1.00 mg/dL   Calcium 8.9 8.9 - 57.8 mg/dL   Total Protein 7.1 6.5 - 8.1 g/dL   Albumin 3.5 3.5 - 5.0 g/dL   AST 16 15 - 41 U/L   ALT 8 (L) 14 - 54 U/L   Alkaline Phosphatase 36 (L) 38 - 126 U/L   Total Bilirubin 0.4 0.3 - 1.2 mg/dL   GFR calc non Af Amer >60 >60 mL/min   GFR calc Af Amer >60 >60 mL/min   Anion gap 8 5 - 15    MAU Course  Procedures  MDM Early pregnancy bleeding and cramping Labs: UA, Blood Type, G/C. Trich, Wet Prep, CBC. CMP. Protein/Creatinine Monitor blood pressures while here in clinic.  Dehydration  Assessment and Plan  1. Bacterial Vaginosis  1. Treat with Flagyl, 500 mg BID for 7 Days 2. Chronic Hypertension  1.  Increase Labetalol from 300 mg BID to 300 mg TID.  2. Advised patient to start taking 81 mg of aspirin QD 3. Anemia  1. Iron Supplementation  2. Advised patient to increase intake of Vitamin C. 4. Hypokalemia  1. 20 meq of Potassium Chloride Orally for 4 days.  2. Advised patient to research potassium rich foods to supplement daily diet.  3. Scheduled follow up in [facility] for 05/30/17 for blood pressure and symptom check/follow-up. 5. Dehydration  1. Advised patient to increase intake of water (6-8 glasses per day).  Huel Cote Ross Bender 05/27/2017, 4:05 PM

## 2017-05-28 LAB — GC/CHLAMYDIA PROBE AMP (~~LOC~~) NOT AT ARMC
Chlamydia: NEGATIVE
Neisseria Gonorrhea: NEGATIVE

## 2017-05-29 LAB — CULTURE, OB URINE: SPECIAL REQUESTS: NORMAL

## 2017-05-30 ENCOUNTER — Encounter (HOSPITAL_COMMUNITY): Payer: Self-pay | Admitting: Obstetrics & Gynecology

## 2017-05-30 ENCOUNTER — Ambulatory Visit: Payer: Medicaid Other

## 2017-05-30 ENCOUNTER — Telehealth: Payer: Self-pay | Admitting: General Practice

## 2017-05-30 NOTE — Telephone Encounter (Signed)
Patient no showed for BP check appt. Called patient, no answer- unable to leave voicemail as it was not set up. Will send mychart message

## 2017-06-10 ENCOUNTER — Other Ambulatory Visit: Payer: Self-pay | Admitting: Obstetrics & Gynecology

## 2017-06-10 ENCOUNTER — Encounter (HOSPITAL_COMMUNITY): Payer: Self-pay

## 2017-06-10 ENCOUNTER — Ambulatory Visit (HOSPITAL_COMMUNITY)
Admission: RE | Admit: 2017-06-10 | Discharge: 2017-06-10 | Disposition: A | Discharge status: Home / Self Care | Payer: Medicaid Other | Source: Ambulatory Visit | Attending: Obstetrics & Gynecology | Admitting: Obstetrics & Gynecology

## 2017-06-10 DIAGNOSIS — Z3689 Encounter for other specified antenatal screening: Secondary | ICD-10-CM | POA: Diagnosis present

## 2017-06-10 DIAGNOSIS — O09892 Supervision of other high risk pregnancies, second trimester: Secondary | ICD-10-CM | POA: Insufficient documentation

## 2017-06-10 DIAGNOSIS — O10012 Pre-existing essential hypertension complicating pregnancy, second trimester: Secondary | ICD-10-CM | POA: Insufficient documentation

## 2017-06-10 DIAGNOSIS — Z3A13 13 weeks gestation of pregnancy: Secondary | ICD-10-CM | POA: Diagnosis not present

## 2017-06-10 DIAGNOSIS — O0990 Supervision of high risk pregnancy, unspecified, unspecified trimester: Secondary | ICD-10-CM

## 2017-06-10 DIAGNOSIS — O10919 Unspecified pre-existing hypertension complicating pregnancy, unspecified trimester: Secondary | ICD-10-CM

## 2017-06-10 DIAGNOSIS — O161 Unspecified maternal hypertension, first trimester: Secondary | ICD-10-CM

## 2017-06-12 NOTE — Addendum Note (Signed)
Encounter addended by: Adam PhenixArnold, James G, MD on: 06/12/2017 3:32 PM  Actions taken: Problem List modified

## 2017-06-17 ENCOUNTER — Ambulatory Visit (INDEPENDENT_AMBULATORY_CARE_PROVIDER_SITE_OTHER): Payer: Medicaid Other | Admitting: Obstetrics & Gynecology

## 2017-06-17 ENCOUNTER — Encounter: Payer: Self-pay | Admitting: Obstetrics & Gynecology

## 2017-06-17 VITALS — BP 148/108 | HR 82 | Wt 171.7 lb

## 2017-06-17 DIAGNOSIS — O0990 Supervision of high risk pregnancy, unspecified, unspecified trimester: Secondary | ICD-10-CM

## 2017-06-17 DIAGNOSIS — Z8751 Personal history of pre-term labor: Secondary | ICD-10-CM

## 2017-06-17 DIAGNOSIS — O10912 Unspecified pre-existing hypertension complicating pregnancy, second trimester: Secondary | ICD-10-CM

## 2017-06-17 DIAGNOSIS — O0992 Supervision of high risk pregnancy, unspecified, second trimester: Secondary | ICD-10-CM

## 2017-06-17 DIAGNOSIS — O10919 Unspecified pre-existing hypertension complicating pregnancy, unspecified trimester: Secondary | ICD-10-CM

## 2017-06-17 DIAGNOSIS — O09212 Supervision of pregnancy with history of pre-term labor, second trimester: Secondary | ICD-10-CM

## 2017-06-17 LAB — POCT URINALYSIS DIP (DEVICE)
BILIRUBIN URINE: NEGATIVE
GLUCOSE, UA: NEGATIVE mg/dL
Hgb urine dipstick: NEGATIVE
KETONES UR: NEGATIVE mg/dL
Leukocytes, UA: NEGATIVE
Nitrite: NEGATIVE
PROTEIN: 30 mg/dL — AB
SPECIFIC GRAVITY, URINE: 1.025 (ref 1.005–1.030)
Urobilinogen, UA: 0.2 mg/dL (ref 0.0–1.0)
pH: 6.5 (ref 5.0–8.0)

## 2017-06-17 NOTE — Progress Notes (Signed)
   PRENATAL VISIT NOTE  Subjective:  Eileen Miller is a 21 y.o. G2P0101 at 7116w1d being seen today for ongoing prenatal care.  She is currently monitored for the following issues for this high-risk pregnancy and has HTN (hypertension); History of cesarean section; History of preterm delivery; Chronic hypertension during pregnancy, antepartum; Supervision of high risk pregnancy, antepartum; and Anemia on their problem list.  Patient reports no complaints.  Contractions: Not present. Vag. Bleeding: None.  Movement: Present. Denies leaking of fluid.   The following portions of the patient's history were reviewed and updated as appropriate: allergies, current medications, past family history, past medical history, past social history, past surgical history and problem list. Problem list updated.  Objective:   Vitals:   06/17/17 0910 06/17/17 0912  BP: (!) 145/109 (!) 148/108  Pulse: 82   Weight: 77.9 kg (171 lb 11.2 oz)     Fetal Status: Fetal Heart Rate (bpm): 160   Movement: Present     General:  Alert, oriented and cooperative. Patient is in no acute distress.  Skin: Skin is warm and dry. No rash noted.   Cardiovascular: Normal heart rate noted  Respiratory: Normal respiratory effort, no problems with respiration noted  Abdomen: Soft, gravid, appropriate for gestational age.  Pain/Pressure: Present     Pelvic: Cervical exam deferred        Extremities: Normal range of motion.  Edema: None  Mental Status:  Normal mood and affect. Normal behavior. Normal judgment and thought content.   Assessment and Plan:  Pregnancy: G2P0101 at 6416w1d  1. History of preterm delivery Scheduled, normal NT - US MFM OB DETAIL +14 WK; Future  2. Chronic hypertension during pregnancy, antepartum  - US MFM OB DETAIL +14 WK; Future  3. Supervision of high risk pregnancy, antepartum  - US MFM OB DETAIL +14 WK; Future  Preterm labor symptoms and general obstetric precautions including but not limited  to vaginal bleeding, contractions, leaking of fluid and fetal movement were reviewed in detail with the patient. Please refer to After Visit Summary for other counseling recommendations.  Return in about 2 weeks (around 07/01/2017) for AFP and BP. Compliance with medication stressed  Scheryl DarterJames Jiovanni Heeter, MD

## 2017-06-17 NOTE — Patient Instructions (Addendum)
Sign up for prenatal classed at [email or web address]  Second Trimester of Pregnancy The second trimester is from week 13 through week 28, month 4 through 6. This is often the time in pregnancy that you feel your best. Often times, morning sickness has lessened or quit. You may have more energy, and you may get hungry more often. Your unborn baby (fetus) is growing rapidly. At the end of the sixth month, he or she is about 9 inches long and weighs about 1 pounds. You will likely feel the baby move (quickening) between 18 and 20 weeks of pregnancy. Follow these instructions at home:  Avoid all smoking, herbs, and alcohol. Avoid drugs not approved by your doctor.  Do not use any tobacco products, including cigarettes, chewing tobacco, and electronic cigarettes. If you need help quitting, ask your doctor. You may get counseling or other support to help you quit.  Only take medicine as told by your doctor. Some medicines are safe and some are not during pregnancy.  Exercise only as told by your doctor. Stop exercising if you start having cramps.  Eat regular, healthy meals.  Wear a good support bra if your breasts are tender.  Do not use hot tubs, steam rooms, or saunas.  Wear your seat belt when driving.  Avoid raw meat, uncooked cheese, and liter boxes and soil used by cats.  Take your prenatal vitamins.  Take 1500-2000 milligrams of calcium daily starting at the 20th week of pregnancy until you deliver your baby.  Try taking medicine that helps you poop (stool softener) as needed, and if your doctor approves. Eat more fiber by eating fresh fruit, vegetables, and whole grains. Drink enough fluids to keep your pee (urine) clear or pale yellow.  Take warm water baths (sitz baths) to soothe pain or discomfort caused by hemorrhoids. Use hemorrhoid cream if your doctor approves.  If you have puffy, bulging veins (varicose veins), wear support hose. Raise (elevate) your feet for 15 minutes,  3-4 times a day. Limit salt in your diet.  Avoid heavy lifting, wear low heals, and sit up straight.  Rest with your legs raised if you have leg cramps or low back pain.  Visit your dentist if you have not gone during your pregnancy. Use a soft toothbrush to brush your teeth. Be gentle when you floss.  You can have sex (intercourse) unless your doctor tells you not to.  Go to your doctor visits. Get help if:  You feel dizzy.  You have mild cramps or pressure in your lower belly (abdomen).  You have a nagging pain in your belly area.  You continue to feel sick to your stomach (nauseous), throw up (vomit), or have watery poop (diarrhea).  You have bad smelling fluid coming from your vagina.  You have pain with peeing (urination). Get help right away if:  You have a fever.  You are leaking fluid from your vagina.  You have spotting or bleeding from your vagina.  You have severe belly cramping or pain.  You lose or gain weight rapidly.  You have trouble catching your breath and have chest pain.  You notice sudden or extreme puffiness (swelling) of your face, hands, ankles, feet, or legs.  You have not felt the baby move in over an hour.  You have severe headaches that do not go away with medicine.  You have vision changes. This information is not intended to replace advice given to you by your health care provider. Make sure you discuss  any questions you have with your health care provider. Document Released: 10/17/2009 Document Revised: 12/29/2015 Document Reviewed: 09/23/2012 Elsevier Interactive Patient Education  2017 [employer].

## 2017-06-17 NOTE — Progress Notes (Signed)
States has missed her bp meds for yesterday and hasn't taken yet today- left at her Mom's. Plans to go get them today. C/o headache today=5.

## 2017-06-19 ENCOUNTER — Other Ambulatory Visit (HOSPITAL_COMMUNITY): Payer: Self-pay

## 2017-07-01 ENCOUNTER — Ambulatory Visit (INDEPENDENT_AMBULATORY_CARE_PROVIDER_SITE_OTHER): Payer: Medicaid Other | Admitting: Family Medicine

## 2017-07-01 VITALS — BP 130/82 | HR 89 | Wt 174.2 lb

## 2017-07-01 DIAGNOSIS — D508 Other iron deficiency anemias: Secondary | ICD-10-CM

## 2017-07-01 DIAGNOSIS — Z98891 History of uterine scar from previous surgery: Secondary | ICD-10-CM

## 2017-07-01 DIAGNOSIS — O09212 Supervision of pregnancy with history of pre-term labor, second trimester: Secondary | ICD-10-CM

## 2017-07-01 DIAGNOSIS — O10912 Unspecified pre-existing hypertension complicating pregnancy, second trimester: Secondary | ICD-10-CM

## 2017-07-01 DIAGNOSIS — O0992 Supervision of high risk pregnancy, unspecified, second trimester: Secondary | ICD-10-CM

## 2017-07-01 DIAGNOSIS — O10919 Unspecified pre-existing hypertension complicating pregnancy, unspecified trimester: Secondary | ICD-10-CM

## 2017-07-01 DIAGNOSIS — Z8751 Personal history of pre-term labor: Secondary | ICD-10-CM

## 2017-07-01 DIAGNOSIS — O0990 Supervision of high risk pregnancy, unspecified, unspecified trimester: Secondary | ICD-10-CM

## 2017-07-01 LAB — POCT URINALYSIS DIP (DEVICE)
Glucose, UA: 100 mg/dL — AB
HGB URINE DIPSTICK: NEGATIVE
Nitrite: NEGATIVE
PH: 6 (ref 5.0–8.0)
Protein, ur: 30 mg/dL — AB
Urobilinogen, UA: 1 mg/dL (ref 0.0–1.0)

## 2017-07-01 NOTE — Progress Notes (Signed)
rou

## 2017-07-01 NOTE — Progress Notes (Signed)
PRENATAL VISIT NOTE  Subjective:  Eileen Miller is a 21 y.o. G2P0101 at 78105w1d being seen today for ongoing prenatal care.  She is currently monitored for the following issues for this high-risk pregnancy and has HTN (hypertension); History of cesarean section; History of preterm delivery; Chronic hypertension during pregnancy, antepartum; Supervision of high risk pregnancy, antepartum; and Anemia on their problem list.  Patient reports no complaints.  Contractions: Not present. Vag. Bleeding: None.  Movement: Present. Denies leaking of fluid.   The following portions of the patient's history were reviewed and updated as appropriate: allergies, current medications, past family history, past medical history, past social history, past surgical history and problem list. Problem list updated.  Objective:   Vitals:   07/01/17 1301  BP: 130/82  Pulse: 89  Weight: 174 lb 3.2 oz (79 kg)    Fetal Status: Fetal Heart Rate (bpm): 161   Movement: Present     General:  Alert, oriented and cooperative. Patient is in no acute distress.  Skin: Skin is warm and dry. No rash noted.   Cardiovascular: Normal heart rate noted  Respiratory: Normal respiratory effort, no problems with respiration noted  Abdomen: Soft, gravid, appropriate for gestational age.  Pain/Pressure: Present     Pelvic: Cervical exam deferred        Extremities: Normal range of motion.  Edema: None  Mental Status:  Normal mood and affect. Normal behavior. Normal judgment and thought content.   Assessment and Plan:  Pregnancy: G2P0101 at 82105w1d  1. Supervision of high risk pregnancy, antepartum - AFP, Serum, Open Spina Bifida - Anatomy scan already scheduled  2. Chronic hypertension during pregnancy, antepartum - BP 130/2 today - Continue labetalol - Continue ASA 81 mg  3. History of cesarean section - Considering TOLAC. Consent form given for her to review  4. History of preterm delivery - Due to severe preeclampsia    5. Other iron deficiency anemia - Continue ferrous sulfate  Preterm labor symptoms and general obstetric precautions including but not limited to vaginal bleeding, contractions, leaking of fluid and fetal movement were reviewed in detail with the patient. Please refer to After Visit Summary for other counseling recommendations.  [facility]2017[facility]  Raynelle FanningJulie P. Yarethzi Branan, MD OB Fellow  Future Appointments  Date Time Provider Department Center  07/26/2017  8:30 AM [facility] US 1 [facility]  08/01/2017 12:40 PM Caldwell BingPickens, Charlie, [facility]

## 2017-07-01 NOTE — Progress Notes (Deleted)
Error

## 2017-07-04 LAB — AFP, SERUM, OPEN SPINA BIFIDA
AFP MoM: 0.87
AFP Value: 28 ng/mL
GEST. AGE ON COLLECTION DATE: 16.1 wk
Maternal Age At EDD: 24 yr
OSBR RISK 1 IN: 10000
TEST RESULTS AFP: NEGATIVE
Weight: 174 [lb_av]

## 2017-07-24 ENCOUNTER — Other Ambulatory Visit: Payer: Self-pay | Admitting: Obstetrics & Gynecology

## 2017-07-24 ENCOUNTER — Other Ambulatory Visit (HOSPITAL_COMMUNITY): Payer: Self-pay | Admitting: *Deleted

## 2017-07-24 ENCOUNTER — Encounter (HOSPITAL_COMMUNITY): Payer: Self-pay

## 2017-07-24 ENCOUNTER — Ambulatory Visit (HOSPITAL_COMMUNITY)
Admission: RE | Admit: 2017-07-24 | Discharge: 2017-07-24 | Disposition: A | Discharge status: Home / Self Care | Payer: Medicaid Other | Source: Ambulatory Visit | Attending: Obstetrics & Gynecology | Admitting: Obstetrics & Gynecology

## 2017-07-24 DIAGNOSIS — O10919 Unspecified pre-existing hypertension complicating pregnancy, unspecified trimester: Secondary | ICD-10-CM

## 2017-07-24 DIAGNOSIS — Z363 Encounter for antenatal screening for malformations: Secondary | ICD-10-CM | POA: Diagnosis present

## 2017-07-24 DIAGNOSIS — O34219 Maternal care for unspecified type scar from previous cesarean delivery: Secondary | ICD-10-CM | POA: Insufficient documentation

## 2017-07-24 DIAGNOSIS — O09219 Supervision of pregnancy with history of pre-term labor, unspecified trimester: Secondary | ICD-10-CM | POA: Diagnosis not present

## 2017-07-24 DIAGNOSIS — O0990 Supervision of high risk pregnancy, unspecified, unspecified trimester: Secondary | ICD-10-CM

## 2017-07-24 DIAGNOSIS — Z3A19 19 weeks gestation of pregnancy: Secondary | ICD-10-CM | POA: Diagnosis not present

## 2017-07-24 DIAGNOSIS — O10019 Pre-existing essential hypertension complicating pregnancy, unspecified trimester: Secondary | ICD-10-CM | POA: Insufficient documentation

## 2017-07-24 DIAGNOSIS — Z8751 Personal history of pre-term labor: Secondary | ICD-10-CM

## 2017-07-24 NOTE — Addendum Note (Signed)
Encounter addended by: Vivien RotaSmall, Brenan Modesto H, RT on: 07/24/2017 2:38 PM  Actions taken: Imaging Exam ended

## 2017-07-26 ENCOUNTER — Ambulatory Visit (HOSPITAL_COMMUNITY): Payer: Medicaid Other

## 2017-08-01 ENCOUNTER — Encounter: Payer: Medicaid Other | Admitting: Obstetrics and Gynecology

## 2017-08-01 ENCOUNTER — Encounter: Payer: Self-pay | Admitting: Obstetrics and Gynecology

## 2017-08-02 ENCOUNTER — Inpatient Hospital Stay (HOSPITAL_COMMUNITY)
Admission: AD | Admit: 2017-08-02 | Discharge: 2017-08-02 | Discharge status: Against Medical Advice | Payer: Medicaid Other | Source: Ambulatory Visit | Attending: Obstetrics & Gynecology | Admitting: Obstetrics & Gynecology

## 2017-08-02 DIAGNOSIS — O10919 Unspecified pre-existing hypertension complicating pregnancy, unspecified trimester: Secondary | ICD-10-CM

## 2017-08-02 DIAGNOSIS — Z5321 Procedure and treatment not carried out due to patient leaving prior to being seen by health care provider: Secondary | ICD-10-CM | POA: Diagnosis present

## 2017-08-02 LAB — URINALYSIS, ROUTINE W REFLEX MICROSCOPIC
Bacteria, UA: NONE SEEN
Bilirubin Urine: NEGATIVE
Glucose, UA: NEGATIVE mg/dL
HGB URINE DIPSTICK: NEGATIVE
Ketones, ur: 20 mg/dL — AB
NITRITE: NEGATIVE
Protein, ur: 100 mg/dL — AB
SPECIFIC GRAVITY, URINE: 1.025 (ref 1.005–1.030)
pH: 6 (ref 5.0–8.0)

## 2017-08-02 NOTE — MAU Note (Signed)
Pt reports she has been havign sharp pain in her lower abd. Pain is coming"back to back'. Denies any vag bleeding or leaking.

## 2017-08-02 NOTE — MAU Note (Signed)
Not in lobby x2.

## 2017-08-02 NOTE — MAU Note (Signed)
Not in lobby x3 assume left AMA

## 2017-08-02 NOTE — Progress Notes (Signed)
Patient did not keep OB appointment for 08/01/2017.  Jerimy Johanson, Jr MD [facility]ending [facility] (Faculty Practice)   

## 2017-08-02 NOTE — MAU Note (Signed)
Not in lobby x1  

## 2017-08-19 ENCOUNTER — Encounter: Payer: Medicaid Other | Admitting: Obstetrics and Gynecology

## 2017-08-21 ENCOUNTER — Ambulatory Visit (HOSPITAL_COMMUNITY)
Admission: RE | Admit: 2017-08-21 | Discharge: 2017-08-21 | Disposition: A | Discharge status: Home / Self Care | Payer: Medicaid Other | Source: Ambulatory Visit | Attending: Obstetrics & Gynecology | Admitting: Obstetrics & Gynecology

## 2017-08-21 ENCOUNTER — Encounter (HOSPITAL_COMMUNITY): Payer: Self-pay

## 2017-08-21 DIAGNOSIS — Z3A23 23 weeks gestation of pregnancy: Secondary | ICD-10-CM | POA: Insufficient documentation

## 2017-08-21 DIAGNOSIS — Z362 Encounter for other antenatal screening follow-up: Secondary | ICD-10-CM | POA: Insufficient documentation

## 2017-08-21 DIAGNOSIS — O34219 Maternal care for unspecified type scar from previous cesarean delivery: Secondary | ICD-10-CM | POA: Insufficient documentation

## 2017-08-21 DIAGNOSIS — O09212 Supervision of pregnancy with history of pre-term labor, second trimester: Secondary | ICD-10-CM | POA: Diagnosis not present

## 2017-08-21 DIAGNOSIS — O10012 Pre-existing essential hypertension complicating pregnancy, second trimester: Secondary | ICD-10-CM | POA: Diagnosis present

## 2017-08-21 DIAGNOSIS — O09892 Supervision of other high risk pregnancies, second trimester: Secondary | ICD-10-CM | POA: Insufficient documentation

## 2017-08-21 DIAGNOSIS — Z363 Encounter for antenatal screening for malformations: Secondary | ICD-10-CM | POA: Diagnosis not present

## 2017-08-21 DIAGNOSIS — O10919 Unspecified pre-existing hypertension complicating pregnancy, unspecified trimester: Secondary | ICD-10-CM

## 2017-08-21 NOTE — Addendum Note (Signed)
Encounter addended by: Earley Brookealrymple, Shunna Mikaelian S on: 08/21/2017 4:46 PM  Actions taken: Imaging Exam ended

## 2017-08-22 ENCOUNTER — Other Ambulatory Visit (HOSPITAL_COMMUNITY): Payer: Self-pay | Admitting: *Deleted

## 2017-08-22 DIAGNOSIS — O10919 Unspecified pre-existing hypertension complicating pregnancy, unspecified trimester: Secondary | ICD-10-CM

## 2017-08-22 NOTE — Addendum Note (Signed)
Encounter addended by: Adam PhenixArnold, Denman Pichardo G, MD on: 08/22/2017 10:43 AM  Actions taken: Problem List modified

## 2017-08-27 ENCOUNTER — Encounter: Payer: Medicaid Other | Admitting: Family Medicine

## 2017-09-02 ENCOUNTER — Ambulatory Visit (INDEPENDENT_AMBULATORY_CARE_PROVIDER_SITE_OTHER): Payer: Medicaid Other | Admitting: Family Medicine

## 2017-09-02 ENCOUNTER — Encounter: Payer: Self-pay | Admitting: Family Medicine

## 2017-09-02 VITALS — BP 150/95 | HR 80 | Wt 168.0 lb

## 2017-09-02 DIAGNOSIS — O09212 Supervision of pregnancy with history of pre-term labor, second trimester: Secondary | ICD-10-CM

## 2017-09-02 DIAGNOSIS — Z98891 History of uterine scar from previous surgery: Secondary | ICD-10-CM

## 2017-09-02 DIAGNOSIS — O10912 Unspecified pre-existing hypertension complicating pregnancy, second trimester: Secondary | ICD-10-CM

## 2017-09-02 DIAGNOSIS — O0990 Supervision of high risk pregnancy, unspecified, unspecified trimester: Secondary | ICD-10-CM

## 2017-09-02 DIAGNOSIS — Z8751 Personal history of pre-term labor: Secondary | ICD-10-CM

## 2017-09-02 DIAGNOSIS — O10919 Unspecified pre-existing hypertension complicating pregnancy, unspecified trimester: Secondary | ICD-10-CM

## 2017-09-02 DIAGNOSIS — O0992 Supervision of high risk pregnancy, unspecified, second trimester: Secondary | ICD-10-CM

## 2017-09-02 NOTE — Progress Notes (Signed)
   PRENATAL VISIT NOTE  Subjective:  Eileen Miller is a 22 y.o. G2P0101 at 3262w1d being seen today for ongoing prenatal care.  She is currently monitored for the following issues for this high-risk pregnancy and has HTN (hypertension); History of cesarean section; History of preterm delivery; Chronic hypertension during pregnancy, antepartum; Supervision of high risk pregnancy, antepartum; and Anemia on their problem list.  Patient reports no complaints.  Contractions: Not present. Vag. Bleeding: None.  Movement: Present. Denies leaking of fluid.   The following portions of the patient's history were reviewed and updated as appropriate: allergies, current medications, past family history, past medical history, past social history, past surgical history and problem list. Problem list updated.  Objective:   Vitals:   09/02/17 1520  BP: (!) 150/95  Pulse: 80  Weight: 168 lb (76.2 kg)    Fetal Status:     Movement: Present     General:  Alert, oriented and cooperative. Patient is in no acute distress.  Skin: Skin is warm and dry. No rash noted.   Cardiovascular: Normal heart rate noted  Respiratory: Normal respiratory effort, no problems with respiration noted  Abdomen: Soft, gravid, appropriate for gestational age.  Pain/Pressure: Present     Pelvic: Cervical exam deferred        Extremities: Normal range of motion.  Edema: None  Mental Status:  Normal mood and affect. Normal behavior. Normal judgment and thought content.   Assessment and Plan:  Pregnancy: G2P0101 at 3862w1d  1. Supervision of high risk pregnancy, antepartum FHT and Fh normal  2. Chronic hypertension during pregnancy, antepartum BP normal Taking ASA 81mg  Daily Next growth US: 2/13  3. History of cesarean section Desires repeat  4. History of preterm delivery Induced for preeclampsia  Preterm labor symptoms and general obstetric precautions including but not limited to vaginal bleeding, contractions, leaking  of fluid and fetal movement were reviewed in detail with the patient. Please refer to After Visit Summary for other counseling recommendations.  No Follow-up on file.   Levie HeritageJacob J Stinson, DO

## 2017-09-18 ENCOUNTER — Inpatient Hospital Stay (HOSPITAL_COMMUNITY)
Admission: AD | Admit: 2017-09-18 | Discharge: 2017-09-18 | Disposition: A | Discharge status: Home / Self Care | Payer: Medicaid Other | Source: Ambulatory Visit | Attending: Obstetrics and Gynecology | Admitting: Obstetrics and Gynecology

## 2017-09-18 ENCOUNTER — Ambulatory Visit (HOSPITAL_COMMUNITY)
Admission: RE | Admit: 2017-09-18 | Discharge: 2017-09-18 | Disposition: A | Discharge status: Home / Self Care | Payer: Medicaid Other | Source: Ambulatory Visit | Attending: Obstetrics & Gynecology | Admitting: Obstetrics & Gynecology

## 2017-09-18 ENCOUNTER — Encounter (HOSPITAL_COMMUNITY): Payer: Self-pay

## 2017-09-18 ENCOUNTER — Other Ambulatory Visit (HOSPITAL_COMMUNITY): Payer: Self-pay | Admitting: *Deleted

## 2017-09-18 DIAGNOSIS — Z3A27 27 weeks gestation of pregnancy: Secondary | ICD-10-CM

## 2017-09-18 DIAGNOSIS — Z7982 Long term (current) use of aspirin: Secondary | ICD-10-CM | POA: Insufficient documentation

## 2017-09-18 DIAGNOSIS — Z362 Encounter for other antenatal screening follow-up: Secondary | ICD-10-CM | POA: Diagnosis present

## 2017-09-18 DIAGNOSIS — O10012 Pre-existing essential hypertension complicating pregnancy, second trimester: Secondary | ICD-10-CM | POA: Diagnosis present

## 2017-09-18 DIAGNOSIS — R030 Elevated blood-pressure reading, without diagnosis of hypertension: Secondary | ICD-10-CM | POA: Diagnosis present

## 2017-09-18 DIAGNOSIS — O10912 Unspecified pre-existing hypertension complicating pregnancy, second trimester: Secondary | ICD-10-CM

## 2017-09-18 DIAGNOSIS — O34219 Maternal care for unspecified type scar from previous cesarean delivery: Secondary | ICD-10-CM | POA: Insufficient documentation

## 2017-09-18 DIAGNOSIS — O10919 Unspecified pre-existing hypertension complicating pregnancy, unspecified trimester: Secondary | ICD-10-CM

## 2017-09-18 DIAGNOSIS — O09212 Supervision of pregnancy with history of pre-term labor, second trimester: Secondary | ICD-10-CM | POA: Insufficient documentation

## 2017-09-18 LAB — URINALYSIS, ROUTINE W REFLEX MICROSCOPIC
BILIRUBIN URINE: NEGATIVE
Glucose, UA: NEGATIVE mg/dL
Hgb urine dipstick: NEGATIVE
KETONES UR: 5 mg/dL — AB
Nitrite: NEGATIVE
PH: 6 (ref 5.0–8.0)
PROTEIN: 30 mg/dL — AB
Specific Gravity, Urine: 1.02 (ref 1.005–1.030)

## 2017-09-18 LAB — PROTEIN / CREATININE RATIO, URINE
Creatinine, Urine: 278 mg/dL
PROTEIN CREATININE RATIO: 0.13 mg/mg{creat} (ref 0.00–0.15)
TOTAL PROTEIN, URINE: 37 mg/dL

## 2017-09-18 LAB — CBC
HEMATOCRIT: 26.3 % — AB (ref 36.0–46.0)
Hemoglobin: 8.8 g/dL — ABNORMAL LOW (ref 12.0–15.0)
MCH: 25.4 pg — ABNORMAL LOW (ref 26.0–34.0)
MCHC: 33.5 g/dL (ref 30.0–36.0)
MCV: 75.8 fL — ABNORMAL LOW (ref 78.0–100.0)
PLATELETS: 274 10*3/uL (ref 150–400)
RBC: 3.47 MIL/uL — ABNORMAL LOW (ref 3.87–5.11)
RDW: 14.3 % (ref 11.5–15.5)
WBC: 5.1 10*3/uL (ref 4.0–10.5)

## 2017-09-18 LAB — COMPREHENSIVE METABOLIC PANEL
ALBUMIN: 2.9 g/dL — AB (ref 3.5–5.0)
ALT: 9 U/L — ABNORMAL LOW (ref 14–54)
ANION GAP: 9 (ref 5–15)
AST: 16 U/L (ref 15–41)
Alkaline Phosphatase: 95 U/L (ref 38–126)
BUN: 9 mg/dL (ref 6–20)
CHLORIDE: 104 mmol/L (ref 101–111)
CO2: 23 mmol/L (ref 22–32)
Calcium: 9.1 mg/dL (ref 8.9–10.3)
Creatinine, Ser: 0.48 mg/dL (ref 0.44–1.00)
GFR calc Af Amer: >60 mL/min (ref >60)
GFR calc non Af Amer: >60 mL/min (ref >60)
GLUCOSE: 80 mg/dL (ref 65–99)
Potassium: 2.9 mmol/L — ABNORMAL LOW (ref 3.5–5.1)
SODIUM: 136 mmol/L (ref 135–145)
TOTAL PROTEIN: 7.2 g/dL (ref 6.5–8.1)
Total Bilirubin: 0.5 mg/dL (ref 0.3–1.2)

## 2017-09-18 MED ORDER — LACTATED RINGERS IV SOLN
INTRAVENOUS | Status: DC
  Administered 2017-09-18: 12:00:00 via INTRAVENOUS

## 2017-09-18 MED ORDER — LABETALOL HCL 5 MG/ML IV SOLN
20.0000 mg | INTRAVENOUS | Status: AC | PRN
  Administered 2017-09-18: 20 mg via INTRAVENOUS
  Administered 2017-09-18: 80 mg via INTRAVENOUS
  Administered 2017-09-18: 40 mg via INTRAVENOUS
  Filled 2017-09-18: qty 4
  Filled 2017-09-18: qty 8
  Filled 2017-09-18: qty 16

## 2017-09-18 MED ORDER — LABETALOL HCL 200 MG PO TABS: 400.0000 mg | ORAL_TABLET | Freq: Three times a day (TID) | ORAL | 1 refills | Status: DC

## 2017-09-18 MED ORDER — LABETALOL HCL 100 MG PO TABS
300.0000 mg | ORAL_TABLET | Freq: Once | ORAL | Status: AC
  Administered 2017-09-18: 300 mg via ORAL
  Filled 2017-09-18: qty 3

## 2017-09-18 MED ORDER — LABETALOL HCL 300 MG PO TABS: 300.0000 mg | ORAL_TABLET | Freq: Three times a day (TID) | ORAL | 0 refills | Status: DC

## 2017-09-18 MED ORDER — HYDRALAZINE HCL 20 MG/ML IJ SOLN
10.0000 mg | Freq: Once | INTRAMUSCULAR | Status: AC | PRN
  Administered 2017-09-18: 10 mg via INTRAVENOUS
  Filled 2017-09-18: qty 1

## 2017-09-18 NOTE — MAU Provider Note (Signed)
History     CSN: [id number]  Arrival date and time: 09/18/17 1102   First Provider Initiated Contact with Patient 09/18/17 1217     Chief Complaint  Patient presents with  . Hypertension   HPI Eileen Miller is a 22 y.o. G2P0101 at 4411w3d who presents from MFM for evaluation of elevated blood pressures. She was being seen for antenatal testing and had elevated blood pressures while there. She has chronic hypertension and is taking labetalol. She is supposed to take it TID but only takes 2 pills at night. She reports blurred vision but no headache or epigastric pain. She denies any leaking or bleeding. Reports good fetal movement.   OB History    Gravida Para Term Preterm AB Living   2 1   1   1    SAB TAB Ectopic Multiple Live Births           1      Past Medical History:  Diagnosis Date  . History of cesarean section 03/03/2016   For severe Preeclampsia. Did not labor.   . History of preterm delivery 03/03/2016   For severe Preeclampsia. Did not labor.   . Hypertension   . Severe preeclampsia 2015    Past Surgical History:  Procedure Laterality Date  . CESAREAN SECTION    . EYE SURGERY    . HERNIA REPAIR      Family History  Problem Relation Age of Onset  . Hypertension Father   . Hypertension Maternal Grandmother   . Hypertension Paternal Grandmother     Social History   Tobacco Use  . Smoking status: Never Smoker  . Smokeless tobacco: Never Used  Substance Use Topics  . Alcohol use: No  . Drug use: No    Allergies:  Allergies  Allergen Reactions  . Food Swelling and Other (See Comments)    Pt states that she is allergic to peanut butter.   Reaction:  Tongue swelling     Medications Prior to Admission  Medication Sig Dispense Refill Last Dose  . labetalol (NORMODYNE) 300 MG tablet Take 600 mg by mouth once.   09/17/2017 at 2230  . Prenatal MV-Min-FA-Omega-3 (PRENATAL GUMMIES/DHA & FA PO) Take 1 tablet by mouth daily.   09/17/2017 at Unknown time  .  acetaminophen (TYLENOL) 325 MG tablet Take 650 mg by mouth every 6 (six) hours as needed for headache.   09/04/2017  . aspirin EC 81 MG tablet Take 1 tablet (81 mg total) by mouth daily. Take after 12 weeks for prevention of preeclampsia later in pregnancy (Patient not taking: Reported on 09/18/2017) 300 tablet 2 Not Taking at Unknown time  . ferrous sulfate (FERROUSUL) 325 (65 FE) MG tablet Take 1 tablet (325 mg total) by mouth 2 (two) times daily. (Patient not taking: Reported on 09/18/2017) 60 tablet 11 Not Taking at Unknown time  . potassium chloride (K-DUR) 10 MEQ tablet Take 2 tablets (20 mEq total) by mouth daily. (Patient not taking: Reported on 09/18/2017) 7 tablet 0 Not Taking at Unknown time  . Prenatal Multivit-Min-Fe-FA (PRENATAL VITAMINS) 0.8 MG tablet Take 1 tablet by mouth daily. (Patient not taking: Reported on 09/18/2017) 30 tablet 12 Not Taking at Unknown time    Review of Systems  Constitutional: Negative.  Negative for fatigue and fever.  HENT: Negative.   Eyes: Positive for visual disturbance.  Respiratory: Negative.  Negative for shortness of breath.   Cardiovascular: Negative.  Negative for chest pain.  Gastrointestinal: Negative.  Negative for abdominal  pain, constipation, diarrhea, nausea and vomiting.  Genitourinary: Negative.  Negative for dysuria.  Neurological: Negative.  Negative for dizziness and headaches.   Physical Exam   Blood pressure (!) 170/115, pulse 76, last menstrual period 02/06/2017, SpO2 100 %, unknown if currently breastfeeding.   Patient Vitals for the past 24 hrs:  BP Pulse SpO2  09/18/17 1446 (!) 168/107 65 -  09/18/17 1443 (!) 169/111 73 -  09/18/17 1424 (!) 177/114 63 -  09/18/17 1408 (!) 163/106 68 -  09/18/17 1353 (!) 158/106 69 -  09/18/17 1344 (!) 164/111 65 -  09/18/17 1333 (!) 172/108 75 -  09/18/17 1315 (!) 165/106 63 -  09/18/17 1257 (!) 152/107 65 -  09/18/17 1242 (!) 152/103 69 -  09/18/17 1228 (!) 170/115 76 -  09/18/17 1214  (!) 161/123 74 100 %  09/18/17 1208 (!) 157/116 75 -     Physical Exam  Nursing note and vitals reviewed. Constitutional: She is oriented to person, place, and time. She appears well-developed and well-nourished. No distress.  HENT:  Head: Normocephalic.  Eyes: Pupils are equal, round, and reactive to light.  Cardiovascular: Normal rate, regular rhythm and normal heart sounds.  Respiratory: Effort normal and breath sounds normal. No respiratory distress.  GI: Soft. Bowel sounds are normal. She exhibits no distension. There is no tenderness.  Neurological: She is alert and oriented to person, place, and time. She has normal reflexes. She displays normal reflexes. No cranial nerve deficit. Coordination normal.  Skin: Skin is warm and dry.  Psychiatric: She has a normal mood and affect. Her behavior is normal. Judgment and thought content normal.   Fetal Tracing:  Baseline: 150 Variability: moderate Accels: 10x10 Decels: none  Toco: none  MAU Course  Procedures Results for orders placed or performed during the hospital encounter of 09/18/17 (from the past 24 hour(s))  CBC     Status: Abnormal   Collection Time: 09/18/17 11:44 AM  Result Value Ref Range   WBC 5.1 4.0 - 10.5 K/uL   RBC 3.47 (L) 3.87 - 5.11 MIL/uL   Hemoglobin 8.8 (L) 12.0 - 15.0 g/dL   HCT 16.1 (L) 09.6 - 04.5 %   MCV 75.8 (L) 78.0 - 100.0 fL   MCH 25.4 (L) 26.0 - 34.0 pg   MCHC 33.5 30.0 - 36.0 g/dL   RDW 40.9 81.1 - 91.4 %   Platelets 274 150 - 400 K/uL  Comprehensive metabolic panel     Status: Abnormal   Collection Time: 09/18/17 11:44 AM  Result Value Ref Range   Sodium 136 135 - 145 mmol/L   Potassium 2.9 (L) 3.5 - 5.1 mmol/L   Chloride 104 101 - 111 mmol/L   CO2 23 22 - 32 mmol/L   Glucose, Bld 80 65 - 99 mg/dL   BUN 9 6 - 20 mg/dL   Creatinine, Ser 7.82 0.44 - 1.00 mg/dL   Calcium 9.1 8.9 - 95.6 mg/dL   Total Protein 7.2 6.5 - 8.1 g/dL   Albumin 2.9 (L) 3.5 - 5.0 g/dL   AST 16 15 - 41 U/L    ALT 9 (L) 14 - 54 U/L   Alkaline Phosphatase 95 38 - 126 U/L   Total Bilirubin 0.5 0.3 - 1.2 mg/dL   GFR calc non Af Amer >60 >60 mL/min   GFR calc Af Amer >60 >60 mL/min   Anion gap 9 5 - 15  Urinalysis, Routine w reflex microscopic     Status: Abnormal  Collection Time: 09/18/17 12:00 PM  Result Value Ref Range   Color, Urine YELLOW YELLOW   APPearance CLOUDY (A) CLEAR   Specific Gravity, Urine 1.020 1.005 - 1.030   pH 6.0 5.0 - 8.0   Glucose, UA NEGATIVE NEGATIVE mg/dL   Hgb urine dipstick NEGATIVE NEGATIVE   Bilirubin Urine NEGATIVE NEGATIVE   Ketones, ur 5 (A) NEGATIVE mg/dL   Protein, ur 30 (A) NEGATIVE mg/dL   Nitrite NEGATIVE NEGATIVE   Leukocytes, UA TRACE (A) NEGATIVE   RBC / HPF 0-5 0 - 5 RBC/hpf   WBC, UA 6-30 0 - 5 WBC/hpf   Bacteria, UA RARE (A) NONE SEEN   Squamous Epithelial / LPF TOO NUMEROUS TO COUNT (A) NONE SEEN   Mucus PRESENT     MDM UA CBC, CMP, protein creat ratio Labatalol protocol ordered after 2 severe range BPs Labetalol 300mg  PO   Results and continued elevated BPs despite intervention reviewed with Dr. Jolayne Panther- will discharge patient home on 400mg  labetalol TID  Assessment and Plan   1. Chronic hypertension during pregnancy, antepartum   2. [redacted] weeks gestation of pregnancy    -Discharge home in stable condition -Labetalol RX updated to 400mg  TID, importance of taking labetalol as prescribed reiterated with patient -Preeclampsia precautions discussed -Patient advised to follow-up with [facility] on Friday for a BP check -Patient may return to MAU as needed or if her condition were to change or worsen  Rolm Bookbinder CNM 09/18/2017, 12:31 PM

## 2017-09-18 NOTE — ED Notes (Signed)
[facility]ol[facility] S-Frizzell, RN, Press photographerCharge Nurse in MAU. Notified of elevated BP's X 2, Chronic HTN on Labetalol.

## 2017-09-18 NOTE — Discharge Instructions (Signed)
Hypertension During Pregnancy °Hypertension, commonly called high blood pressure, is when the force of blood pumping through your arteries is too strong. Arteries are blood vessels that carry blood from the heart throughout the body. Hypertension during pregnancy can cause problems for you and your baby. Your baby may be born early (prematurely) or may not weigh as much as he or she should at birth. Very bad cases of hypertension during pregnancy can be life-threatening. °Different types of hypertension can occur during pregnancy. These include: °· Chronic hypertension. This happens when: °? You have hypertension before pregnancy and it continues during pregnancy. °? You develop hypertension before you are [redacted] weeks pregnant, and it continues during pregnancy. °· Gestational hypertension. This is hypertension that develops after the 20th week of pregnancy. °· Preeclampsia, also called toxemia of pregnancy. This is a very serious type of hypertension that develops only during pregnancy. It affects the whole body, and it can be very dangerous for you and your baby. ° °Gestational hypertension and preeclampsia usually go away within 6 weeks after your baby is born. Women who have hypertension during pregnancy have a greater chance of developing hypertension later in life or during future pregnancies. °What are the causes? °The exact cause of hypertension is not known. °What increases the risk? °There are certain factors that make it more likely for you to develop hypertension during pregnancy. These include: °· Having hypertension during a previous pregnancy or prior to pregnancy. °· Being overweight. °· Being older than age 40. °· Being pregnant for the first time or being pregnant with more than one baby. °· Becoming pregnant using fertilization methods such as IVF (in vitro fertilization). °· Having diabetes, kidney problems, or systemic lupus erythematosus. °· Having a family history of hypertension. ° °What are the  signs or symptoms? °Chronic hypertension and gestational hypertension rarely cause symptoms. Preeclampsia causes symptoms, which may include: °· Increased protein in your urine. Your health care provider will check for this at every visit before you give birth (prenatal visit). °· Severe headaches. °· Sudden weight gain. °· Swelling of the hands, face, legs, and feet. °· Nausea and vomiting. °· Vision problems, such as blurred or double vision. °· Numbness in the face, arms, legs, and feet. °· Dizziness. °· Slurred speech. °· Sensitivity to bright lights. °· Abdominal pain. °· Convulsions. ° °How is this diagnosed? °You may be diagnosed with hypertension during a routine prenatal exam. At each prenatal visit, you may: °· Have a urine test to check for high amounts of protein in your urine. °· Have your blood pressure checked. A blood pressure reading is recorded as two numbers, such as "120 over 80" (or 120/80). The first ("top") number is called the systolic pressure. It is a measure of the pressure in your arteries when your heart beats. The second ("bottom") number is called the diastolic pressure. It is a measure of the pressure in your arteries as your heart relaxes between beats. Blood pressure is measured in a unit called mm Hg. A normal blood pressure reading is: °? Systolic: below 120. °? Diastolic: below 80. ° °The type of hypertension that you are diagnosed with depends on your test results and when your symptoms developed. °· Chronic hypertension is usually diagnosed before 20 weeks of pregnancy. °· Gestational hypertension is usually diagnosed after 20 weeks of pregnancy. °· Hypertension with high amounts of protein in the urine is diagnosed as preeclampsia. °· Blood pressure measurements that stay above 160 systolic, or above 110 diastolic, are   signs of severe preeclampsia. ° °How is this treated? °Treatment for hypertension during pregnancy varies depending on the type of hypertension you have and how  serious it is. °· If you take medicines called ACE inhibitors to treat chronic hypertension, you may need to switch medicines. ACE inhibitors should not be taken during pregnancy. °· If you have gestational hypertension, you may need to take blood pressure medicine. °· If you are at risk for preeclampsia, your health care provider may recommend that you take a low-dose aspirin every day to prevent high blood pressure during your pregnancy. °· If you have severe preeclampsia, you may need to be hospitalized so you and your baby can be monitored closely. You may also need to take medicine (magnesium sulfate) to prevent seizures and to lower blood pressure. This medicine may be given as an injection or through an IV tube. °· In some cases, if your condition gets worse, you may need to deliver your baby early. ° °Follow these instructions at home: °Eating and drinking °· Drink enough fluid to keep your urine clear or pale yellow. °· Eat a healthy diet that is low in salt (sodium). Do not add salt to your food. Check food labels to see how much sodium a food or beverage contains. °Lifestyle °· Do not use any products that contain nicotine or tobacco, such as cigarettes and e-cigarettes. If you need help quitting, ask your health care provider. °· Do not use alcohol. °· Avoid caffeine. °· Avoid stress as much as possible. Rest and get plenty of sleep. °General instructions °· Take over-the-counter and prescription medicines only as told by your health care provider. °· While lying down, lie on your left side. This keeps pressure off your baby. °· While sitting or lying down, raise (elevate) your feet. Try putting some pillows under your lower legs. °· Exercise regularly. Ask your health care provider what kinds of exercise are best for you. °· Keep all prenatal and follow-up visits as told by your health care provider. This is important. °Contact a health care provider if: °· You have symptoms that your health care  provider told you may require more treatment or monitoring, such as: °? Fever. °? Vomiting. °? Headache. °Get help right away if: °· You have severe abdominal pain or vomiting that does not get better with treatment. °· You suddenly develop swelling in your hands, ankles, or face. °· You gain 4 lbs (1.8 kg) or more in 1 week. °· You develop vaginal bleeding, or you have blood in your urine. °· You do not feel your baby moving as much as usual. °· You have blurred or double vision. °· You have muscle twitching or sudden tightening (spasms). °· You have shortness of breath. °· Your lips or fingernails turn blue. °This information is not intended to replace advice given to you by your health care provider. Make sure you discuss any questions you have with your health care provider. °Document Released: 04/10/2011 Document Revised: 02/10/2016 Document Reviewed: 01/06/2016 °Elsevier Interactive Patient Education © 2018 [employer] ° °

## 2017-09-18 NOTE — MAU Note (Signed)
Pt with chronic HTN, on meds, is to be taking BID. Sent from MFM

## 2017-09-18 NOTE — ED Notes (Signed)
Pt reports taking labetalol at night time only.  Labetalol is prescribed 300 mg three times a day.  Pt says this makes her tired so she takes two pills once at night only.

## 2017-09-18 NOTE — MAU Note (Signed)
Pt sent from MFM with elevated BP. Patient denies LOF. +FM.  Patient states she feels drowsy with blurred vision, no headache at this time or upper epigastric pain.

## 2017-09-18 NOTE — Addendum Note (Signed)
Encounter addended by: Earley Brookealrymple, Clarity Ciszek S on: 09/18/2017 11:32 AM  Actions taken: Imaging Exam ended

## 2017-09-20 ENCOUNTER — Ambulatory Visit: Payer: Medicaid Other | Admitting: *Deleted

## 2017-09-20 ENCOUNTER — Encounter: Payer: Self-pay | Admitting: *Deleted

## 2017-09-20 ENCOUNTER — Other Ambulatory Visit: Payer: Self-pay | Admitting: Student

## 2017-09-20 VITALS — BP 148/109 | HR 80

## 2017-09-20 DIAGNOSIS — O0990 Supervision of high risk pregnancy, unspecified, unspecified trimester: Secondary | ICD-10-CM

## 2017-09-20 MED ORDER — LABETALOL HCL 300 MG PO TABS: 600.0000 mg | ORAL_TABLET | Freq: Three times a day (TID) | ORAL | 3 refills | Status: DC

## 2017-09-20 NOTE — Progress Notes (Signed)
Pt came for B/P check- still elevated. Spoke to dr. Marice Potterove elevate the labetalol prescribe Rx labetalol 600mg  2tab 3 tid

## 2017-09-20 NOTE — Progress Notes (Unsigned)
l °

## 2017-09-25 IMAGING — US US OB TRANSVAGINAL
1 series · 15 of 28 positions shown · non-contrast
Comparison: 04/19/2017

CLINICAL DATA: First trimester pregnancy with inconclusive fetal
viability. Unsure of LMP.

EXAM:
TRANSVAGINAL OB ULTRASOUND
TECHNIQUE: Transvaginal ultrasound was performed for complete evaluation of the
gestation as well as the maternal uterus, adnexal regions, and
pelvic cul-de-sac.

[Series 1: us ob transvaginal · 15 of 34 slices shown]
[im 1/34]
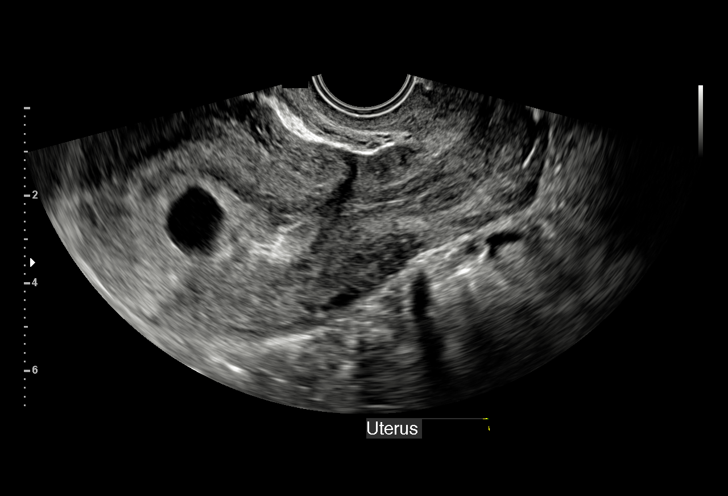
[im 3/34]
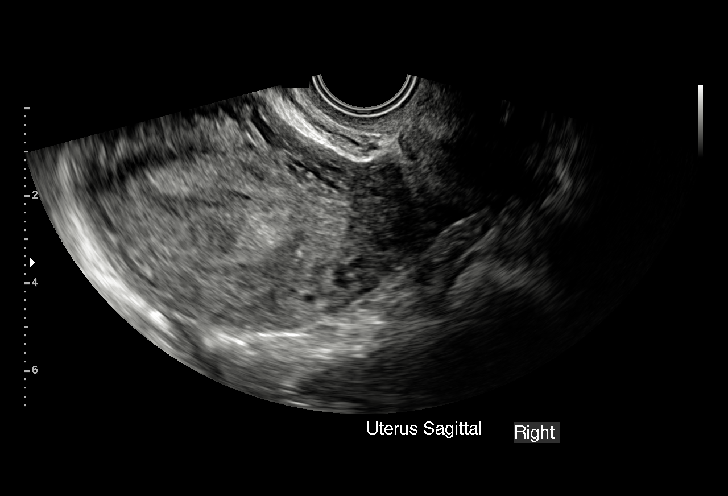
[im 5/34]
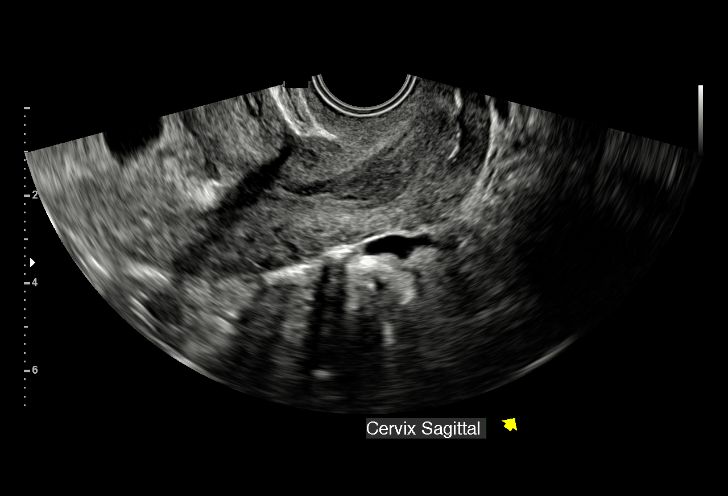
[im 8/34]
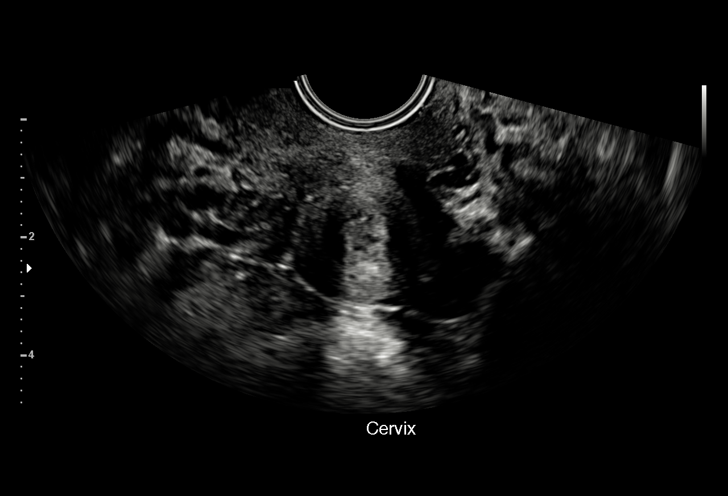
[im 10/34]
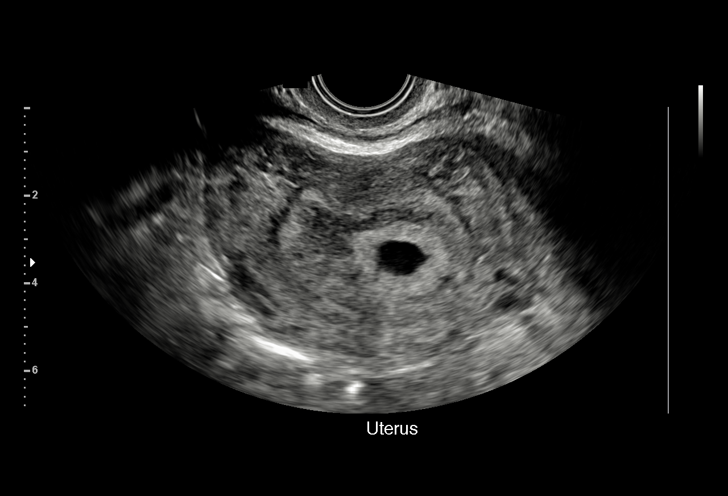
[im 13/34]
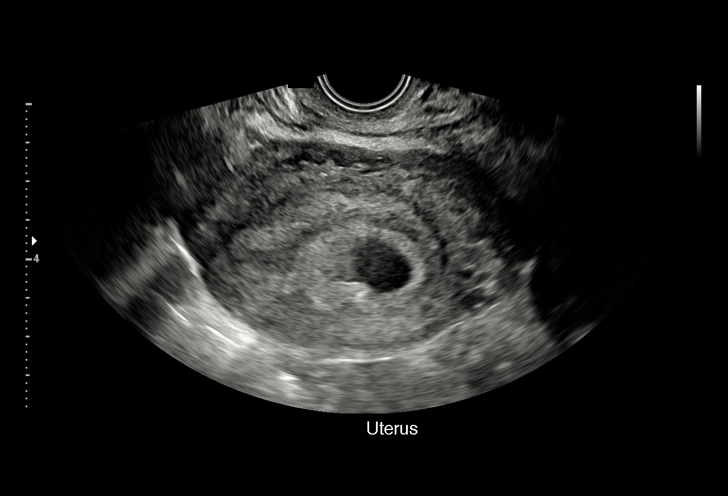
[im 15/34]
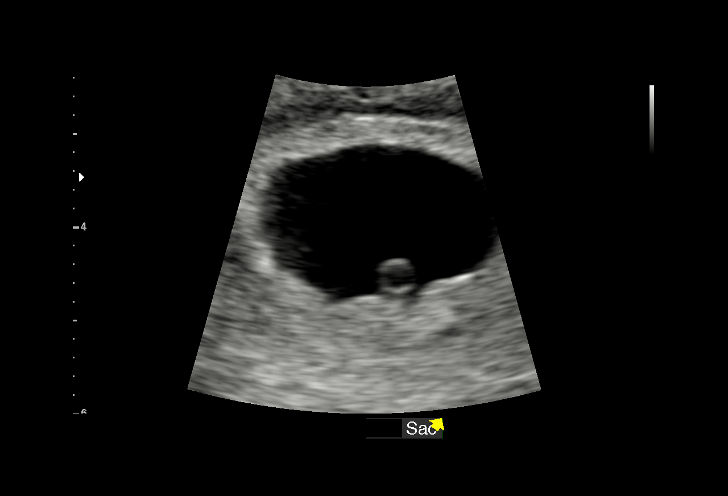
[im 18/34]
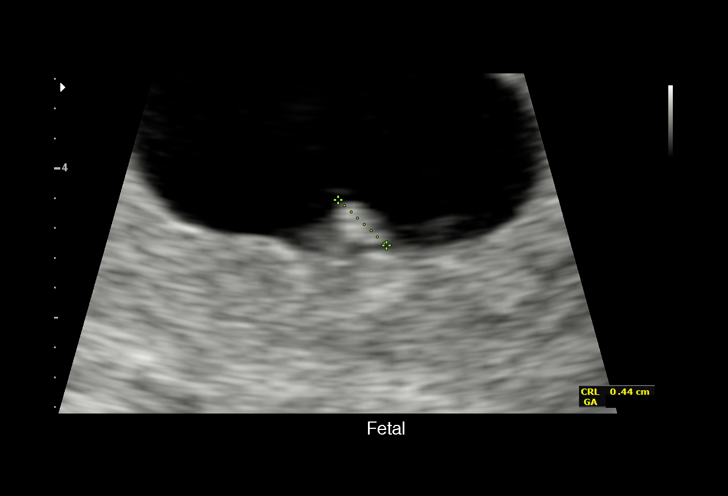
[im 19/34]
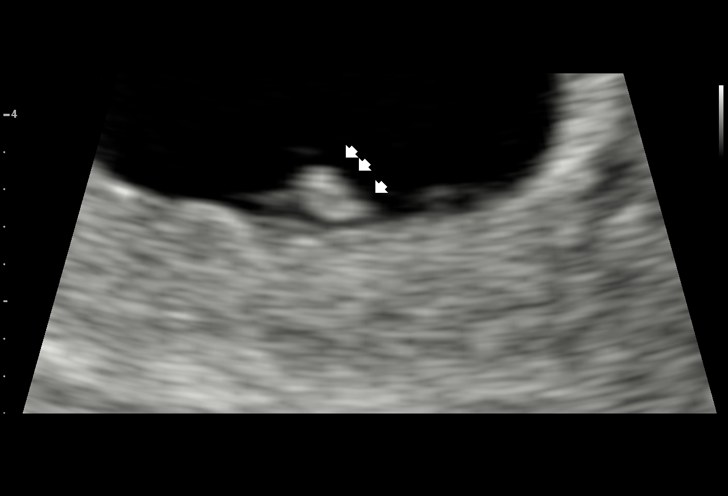
[im 21/34]
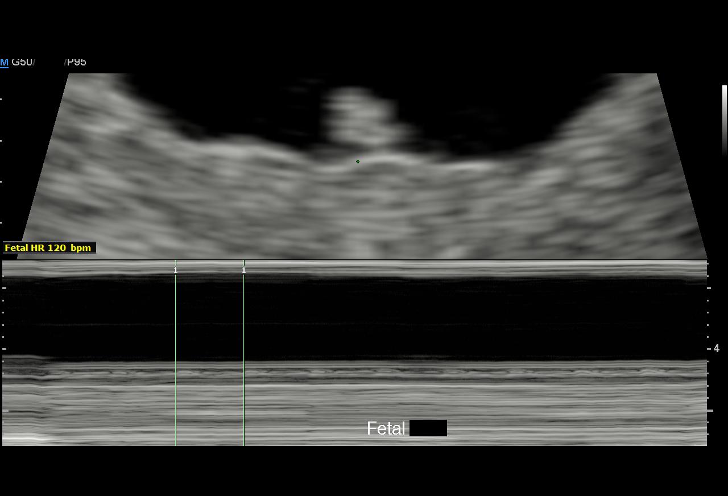
[im 24/34]
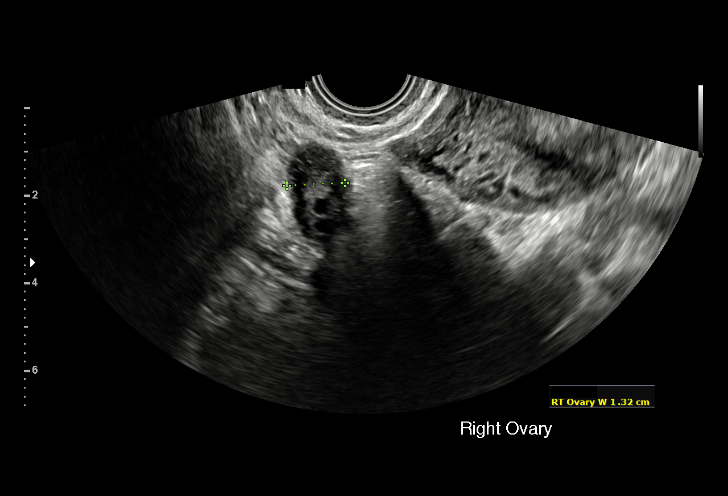
[im 26/34]
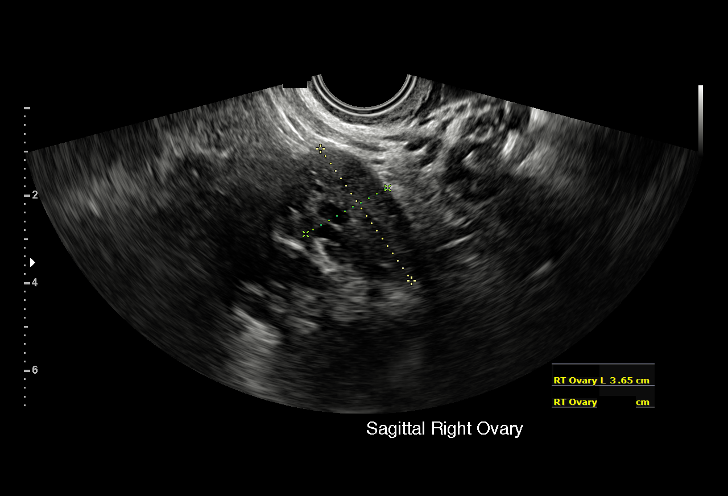
[im 29/34]
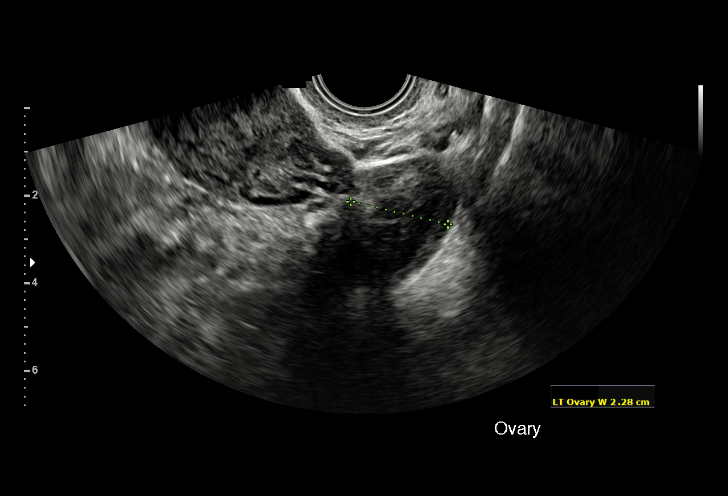
[im 31/34]
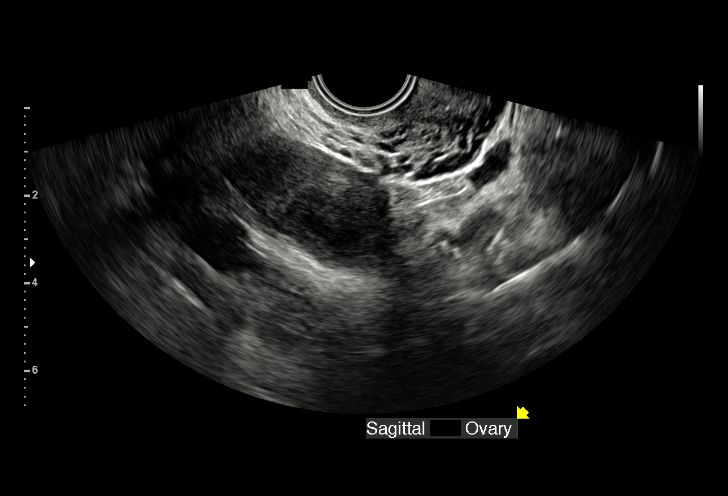
[im 34/34]
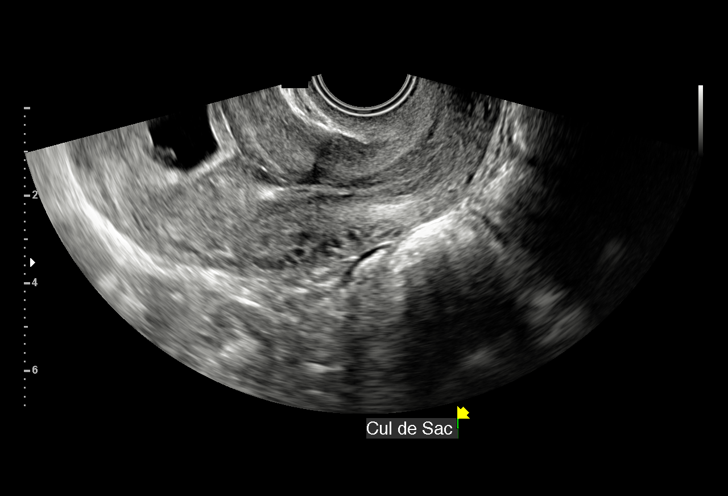

[15 of 28 positions shown; findings below may reference images not displayed]

FINDINGS: Intrauterine gestational sac: Single

Yolk sac:  Visualized.

Embryo:  Visualized.

Cardiac Activity: Visualized.

Heart Rate: 120 bpm

CRL:   4  mm   6 w 1 d                  US EDC: 12/15/2016

Subchorionic hemorrhage:  None visualized.

Maternal uterus/adnexae: Both ovaries are normal in appearance. No
mass or free fluid identified.
IMPRESSION: Single living IUP measuring 6 weeks 1 day, with US EDC of
12/15/2016.

No significant maternal uterine or adnexal abnormality identified.

## 2017-09-27 ENCOUNTER — Inpatient Hospital Stay (HOSPITAL_COMMUNITY)
Admission: AD | Admit: 2017-09-27 | Discharge: 2017-09-27 | Disposition: A | Discharge status: Home / Self Care | Payer: Medicaid Other | Source: Ambulatory Visit | Attending: Obstetrics & Gynecology | Admitting: Obstetrics & Gynecology

## 2017-09-27 ENCOUNTER — Other Ambulatory Visit: Payer: Self-pay

## 2017-09-27 ENCOUNTER — Encounter (HOSPITAL_COMMUNITY): Payer: Self-pay | Admitting: *Deleted

## 2017-09-27 DIAGNOSIS — O10913 Unspecified pre-existing hypertension complicating pregnancy, third trimester: Secondary | ICD-10-CM | POA: Diagnosis not present

## 2017-09-27 DIAGNOSIS — Z3A28 28 weeks gestation of pregnancy: Secondary | ICD-10-CM | POA: Diagnosis not present

## 2017-09-27 DIAGNOSIS — R030 Elevated blood-pressure reading, without diagnosis of hypertension: Secondary | ICD-10-CM | POA: Diagnosis present

## 2017-09-27 DIAGNOSIS — O10013 Pre-existing essential hypertension complicating pregnancy, third trimester: Secondary | ICD-10-CM | POA: Insufficient documentation

## 2017-09-27 DIAGNOSIS — Z7982 Long term (current) use of aspirin: Secondary | ICD-10-CM | POA: Insufficient documentation

## 2017-09-27 DIAGNOSIS — O10919 Unspecified pre-existing hypertension complicating pregnancy, unspecified trimester: Secondary | ICD-10-CM

## 2017-09-27 LAB — COMPREHENSIVE METABOLIC PANEL
ALK PHOS: 108 U/L (ref 38–126)
ALT: 9 U/L — AB (ref 14–54)
ANION GAP: 9 (ref 5–15)
AST: 20 U/L (ref 15–41)
Albumin: 2.8 g/dL — ABNORMAL LOW (ref 3.5–5.0)
BILIRUBIN TOTAL: 0.8 mg/dL (ref 0.3–1.2)
BUN: 8 mg/dL (ref 6–20)
CALCIUM: 8.7 mg/dL — AB (ref 8.9–10.3)
CO2: 23 mmol/L (ref 22–32)
CREATININE: 0.58 mg/dL (ref 0.44–1.00)
Chloride: 102 mmol/L (ref 101–111)
GFR calc Af Amer: >60 mL/min (ref >60)
GFR calc non Af Amer: >60 mL/min (ref >60)
GLUCOSE: 80 mg/dL (ref 65–99)
Potassium: 2.7 mmol/L — CL (ref 3.5–5.1)
Sodium: 134 mmol/L — ABNORMAL LOW (ref 135–145)
TOTAL PROTEIN: 6.6 g/dL (ref 6.5–8.1)

## 2017-09-27 LAB — CBC
HEMATOCRIT: 24.4 % — AB (ref 36.0–46.0)
HEMOGLOBIN: 8.2 g/dL — AB (ref 12.0–15.0)
MCH: 25 pg — AB (ref 26.0–34.0)
MCHC: 33.6 g/dL (ref 30.0–36.0)
MCV: 74.4 fL — ABNORMAL LOW (ref 78.0–100.0)
Platelets: 344 10*3/uL (ref 150–400)
RBC: 3.28 MIL/uL — AB (ref 3.87–5.11)
RDW: 14.2 % (ref 11.5–15.5)
WBC: 6 10*3/uL (ref 4.0–10.5)

## 2017-09-27 LAB — URINALYSIS, ROUTINE W REFLEX MICROSCOPIC
BILIRUBIN URINE: NEGATIVE
Glucose, UA: NEGATIVE mg/dL
Hgb urine dipstick: NEGATIVE
KETONES UR: NEGATIVE mg/dL
Nitrite: NEGATIVE
PH: 6 (ref 5.0–8.0)
Protein, ur: 30 mg/dL — AB
Specific Gravity, Urine: 1.016 (ref 1.005–1.030)

## 2017-09-27 LAB — PROTEIN / CREATININE RATIO, URINE
Creatinine, Urine: 211 mg/dL
PROTEIN CREATININE RATIO: 0.19 mg/mg{creat} — AB (ref 0.00–0.15)
Total Protein, Urine: 40 mg/dL

## 2017-09-27 MED ORDER — BETAMETHASONE SOD PHOS & ACET 6 (3-3) MG/ML IJ SUSP
12.0000 mg | Freq: Once | INTRAMUSCULAR | Status: AC
  Administered 2017-09-27: 12 mg via INTRAMUSCULAR
  Filled 2017-09-27: qty 2

## 2017-09-27 MED ORDER — HYDRALAZINE HCL 20 MG/ML IJ SOLN
10.0000 mg | Freq: Once | INTRAMUSCULAR | Status: AC
  Administered 2017-09-27: 10 mg via INTRAVENOUS
  Filled 2017-09-27: qty 1

## 2017-09-27 MED ORDER — BUTALBITAL-APAP-CAFFEINE 50-325-40 MG PO TABS
2.0000 | ORAL_TABLET | Freq: Once | ORAL | Status: AC
  Administered 2017-09-27: 2 via ORAL
  Filled 2017-09-27: qty 2

## 2017-09-27 MED ORDER — NIFEDIPINE ER OSMOTIC RELEASE 30 MG PO TB24: 30.0000 mg | ORAL_TABLET | Freq: Two times a day (BID) | ORAL | 1 refills | Status: DC

## 2017-09-27 MED ORDER — LABETALOL HCL 200 MG PO TABS: 800.0000 mg | ORAL_TABLET | Freq: Two times a day (BID) | ORAL | 0 refills | Status: DC

## 2017-09-27 NOTE — MAU Note (Signed)
Was here last Wed.  Has been taking BP med.  Had a really bad HA last night and this morning.  Was told if that happened to come back, took 1 Tylenol- helped some. Had blurry vision last night, denies swelling or epigastric pain at this time.

## 2017-09-27 NOTE — Discharge Instructions (Signed)
Hypertension During Pregnancy °Hypertension, commonly called high blood pressure, is when the force of blood pumping through your arteries is too strong. Arteries are blood vessels that carry blood from the heart throughout the body. Hypertension during pregnancy can cause problems for you and your baby. Your baby may be born early (prematurely) or may not weigh as much as he or she should at birth. Very bad cases of hypertension during pregnancy can be life-threatening. °Different types of hypertension can occur during pregnancy. These include: °· Chronic hypertension. This happens when: °? You have hypertension before pregnancy and it continues during pregnancy. °? You develop hypertension before you are [redacted] weeks pregnant, and it continues during pregnancy. °· Gestational hypertension. This is hypertension that develops after the 20th week of pregnancy. °· Preeclampsia, also called toxemia of pregnancy. This is a very serious type of hypertension that develops only during pregnancy. It affects the whole body, and it can be very dangerous for you and your baby. ° °Gestational hypertension and preeclampsia usually go away within 6 weeks after your baby is born. Women who have hypertension during pregnancy have a greater chance of developing hypertension later in life or during future pregnancies. °What are the causes? °The exact cause of hypertension is not known. °What increases the risk? °There are certain factors that make it more likely for you to develop hypertension during pregnancy. These include: °· Having hypertension during a previous pregnancy or prior to pregnancy. °· Being overweight. °· Being older than age 40. °· Being pregnant for the first time or being pregnant with more than one baby. °· Becoming pregnant using fertilization methods such as IVF (in vitro fertilization). °· Having diabetes, kidney problems, or systemic lupus erythematosus. °· Having a family history of hypertension. ° °What are the  signs or symptoms? °Chronic hypertension and gestational hypertension rarely cause symptoms. Preeclampsia causes symptoms, which may include: °· Increased protein in your urine. Your health care provider will check for this at every visit before you give birth (prenatal visit). °· Severe headaches. °· Sudden weight gain. °· Swelling of the hands, face, legs, and feet. °· Nausea and vomiting. °· Vision problems, such as blurred or double vision. °· Numbness in the face, arms, legs, and feet. °· Dizziness. °· Slurred speech. °· Sensitivity to bright lights. °· Abdominal pain. °· Convulsions. ° °How is this diagnosed? °You may be diagnosed with hypertension during a routine prenatal exam. At each prenatal visit, you may: °· Have a urine test to check for high amounts of protein in your urine. °· Have your blood pressure checked. A blood pressure reading is recorded as two numbers, such as "120 over 80" (or 120/80). The first ("top") number is called the systolic pressure. It is a measure of the pressure in your arteries when your heart beats. The second ("bottom") number is called the diastolic pressure. It is a measure of the pressure in your arteries as your heart relaxes between beats. Blood pressure is measured in a unit called mm Hg. A normal blood pressure reading is: °? Systolic: below 120. °? Diastolic: below 80. ° °The type of hypertension that you are diagnosed with depends on your test results and when your symptoms developed. °· Chronic hypertension is usually diagnosed before 20 weeks of pregnancy. °· Gestational hypertension is usually diagnosed after 20 weeks of pregnancy. °· Hypertension with high amounts of protein in the urine is diagnosed as preeclampsia. °· Blood pressure measurements that stay above 160 systolic, or above 110 diastolic, are   signs of severe preeclampsia. ° °How is this treated? °Treatment for hypertension during pregnancy varies depending on the type of hypertension you have and how  serious it is. °· If you take medicines called ACE inhibitors to treat chronic hypertension, you may need to switch medicines. ACE inhibitors should not be taken during pregnancy. °· If you have gestational hypertension, you may need to take blood pressure medicine. °· If you are at risk for preeclampsia, your health care provider may recommend that you take a low-dose aspirin every day to prevent high blood pressure during your pregnancy. °· If you have severe preeclampsia, you may need to be hospitalized so you and your baby can be monitored closely. You may also need to take medicine (magnesium sulfate) to prevent seizures and to lower blood pressure. This medicine may be given as an injection or through an IV tube. °· In some cases, if your condition gets worse, you may need to deliver your baby early. ° °Follow these instructions at home: °Eating and drinking °· Drink enough fluid to keep your urine clear or pale yellow. °· Eat a healthy diet that is low in salt (sodium). Do not add salt to your food. Check food labels to see how much sodium a food or beverage contains. °Lifestyle °· Do not use any products that contain nicotine or tobacco, such as cigarettes and e-cigarettes. If you need help quitting, ask your health care provider. °· Do not use alcohol. °· Avoid caffeine. °· Avoid stress as much as possible. Rest and get plenty of sleep. °General instructions °· Take over-the-counter and prescription medicines only as told by your health care provider. °· While lying down, lie on your left side. This keeps pressure off your baby. °· While sitting or lying down, raise (elevate) your feet. Try putting some pillows under your lower legs. °· Exercise regularly. Ask your health care provider what kinds of exercise are best for you. °· Keep all prenatal and follow-up visits as told by your health care provider. This is important. °Contact a health care provider if: °· You have symptoms that your health care  provider told you may require more treatment or monitoring, such as: °? Fever. °? Vomiting. °? Headache. °Get help right away if: °· You have severe abdominal pain or vomiting that does not get better with treatment. °· You suddenly develop swelling in your hands, ankles, or face. °· You gain 4 lbs (1.8 kg) or more in 1 week. °· You develop vaginal bleeding, or you have blood in your urine. °· You do not feel your baby moving as much as usual. °· You have blurred or double vision. °· You have muscle twitching or sudden tightening (spasms). °· You have shortness of breath. °· Your lips or fingernails turn blue. °This information is not intended to replace advice given to you by your health care provider. Make sure you discuss any questions you have with your health care provider. °Document Released: 04/10/2011 Document Revised: 02/10/2016 Document Reviewed: 01/06/2016 °Elsevier Interactive Patient Education © 2018 [employer] ° °

## 2017-09-27 NOTE — MAU Provider Note (Signed)
History     CSN: [id number]  Arrival date and time: 09/27/17 1126   First Provider Initiated Contact with Patient 09/27/17 1213     Chief Complaint  Patient presents with  . Hypertension  . Headache   HPI Eileen Miller is a 22 y.o. G2P0101 at [redacted]w[redacted]d who presents with elevated blood pressures, a headache, and visual changes. She states she is taking her labetalol 600mg  TID like she is supposed to for chronic hypertension. She states last night she got a bad headache and tried tylenol with no relief. She states this morning she saw "floaters" when she woke up. She denies any epigastric pain.    OB History    Gravida Para Term Preterm AB Living   2 1   1   1    SAB TAB Ectopic Multiple Live Births           1      Past Medical History:  Diagnosis Date  . History of cesarean section 03/03/2016   For severe Preeclampsia. Did not labor.   . History of preterm delivery 03/03/2016   For severe Preeclampsia. Did not labor.   . Hypertension   . Severe preeclampsia 2015    Past Surgical History:  Procedure Laterality Date  . CESAREAN SECTION    . EYE SURGERY    . HERNIA REPAIR      Family History  Problem Relation Age of Onset  . Hypertension Father   . Hypertension Maternal Grandmother   . Hypertension Paternal Grandmother     Social History   Tobacco Use  . Smoking status: Never Smoker  . Smokeless tobacco: Never Used  Substance Use Topics  . Alcohol use: No  . Drug use: No    Allergies:  Allergies  Allergen Reactions  . Food Swelling and Other (See Comments)    Pt states that she is allergic to peanut butter.   Reaction:  Tongue swelling     Medications Prior to Admission  Medication Sig Dispense Refill Last Dose  . acetaminophen (TYLENOL) 325 MG tablet Take 650 mg by mouth every 6 (six) hours as needed for headache.   Taking  . aspirin EC 81 MG tablet Take 1 tablet (81 mg total) by mouth daily. Take after 12 weeks for prevention of preeclampsia later in  pregnancy 300 tablet 2 Taking  . ferrous sulfate (FERROUSUL) 325 (65 FE) MG tablet Take 1 tablet (325 mg total) by mouth 2 (two) times daily. 60 tablet 11 Taking  . labetalol (NORMODYNE) 300 MG tablet Take 2 tablets (600 mg total) by mouth 3 (three) times daily. 180 tablet 3 Taking  . potassium chloride (K-DUR) 10 MEQ tablet Take 2 tablets (20 mEq total) by mouth daily. 7 tablet 0 Taking  . Prenatal MV-Min-FA-Omega-3 (PRENATAL GUMMIES/DHA & FA PO) Take 1 tablet by mouth daily.   Taking    Review of Systems  Constitutional: Negative.  Negative for fatigue and fever.  HENT: Negative.   Eyes: Positive for visual disturbance.  Respiratory: Negative.  Negative for shortness of breath.   Cardiovascular: Negative.  Negative for chest pain.  Gastrointestinal: Negative.  Negative for abdominal pain, constipation, diarrhea, nausea and vomiting.  Genitourinary: Negative.  Negative for dysuria.  Neurological: Positive for headaches. Negative for dizziness.   Physical Exam   Blood pressure (!) 150/114, pulse 69, temperature 98.9 F (37.2 C), temperature source Oral, resp. rate 17, weight 172 lb 4 oz (78.1 kg), last menstrual period 02/06/2017, SpO2 100 %, unknown if  currently breastfeeding.   Patient Vitals for the past 24 hrs:  BP Temp Temp src Pulse Resp SpO2 Weight  09/27/17 1331 (!) 153/106 - - 67 - - -  09/27/17 1316 (!) 149/104 - - 64 - - -  09/27/17 1301 (!) 149/109 - - 69 - - -  09/27/17 1300 (!) 156/107 - - 67 - - -  09/27/17 1249 (!) 164/115 - - 62 - - -  09/27/17 1231 (!) 161/124 - - 66 - - -  09/27/17 1216 (!) 145/114 - - 75 - - -  09/27/17 1207 (!) 150/114 - - 69 - - -  09/27/17 1155 (!) 156/111 98.9 F (37.2 C) Oral 74 17 100 % 172 lb 4 oz (78.1 kg)   Physical Exam  Nursing note and vitals reviewed. Constitutional: She is oriented to person, place, and time. She appears well-developed and well-nourished. No distress.  HENT:  Head: Normocephalic.  Eyes: Pupils are equal,  round, and reactive to light.  Cardiovascular: Normal rate, regular rhythm and normal heart sounds.  Respiratory: Effort normal and breath sounds normal. No respiratory distress.  GI: Soft. Bowel sounds are normal. She exhibits no distension. There is no tenderness.  Neurological: She is alert and oriented to person, place, and time. She has normal reflexes. She displays normal reflexes. Coordination normal.  Skin: Skin is warm and dry.  Psychiatric: She has a normal mood and affect. Her behavior is normal. Judgment and thought content normal.    MAU Course  Procedures Results for orders placed or performed during the hospital encounter of 09/27/17 (from the past 24 hour(s))  Protein / creatinine ratio, urine     Status: Abnormal   Collection Time: 09/27/17 11:40 AM  Result Value Ref Range   Creatinine, Urine 211.00 mg/dL   Total Protein, Urine 40 mg/dL   Protein Creatinine Ratio 0.19 (H) 0.00 - 0.15 mg/mg[Cre]  Urinalysis, Routine w reflex microscopic     Status: Abnormal   Collection Time: 09/27/17 11:40 AM  Result Value Ref Range   Color, Urine YELLOW YELLOW   APPearance CLOUDY (A) CLEAR   Specific Gravity, Urine 1.016 1.005 - 1.030   pH 6.0 5.0 - 8.0   Glucose, UA NEGATIVE NEGATIVE mg/dL   Hgb urine dipstick NEGATIVE NEGATIVE   Bilirubin Urine NEGATIVE NEGATIVE   Ketones, ur NEGATIVE NEGATIVE mg/dL   Protein, ur 30 (A) NEGATIVE mg/dL   Nitrite NEGATIVE NEGATIVE   Leukocytes, UA TRACE (A) NEGATIVE   RBC / HPF 0-5 0 - 5 RBC/hpf   WBC, UA 6-30 0 - 5 WBC/hpf   Bacteria, UA RARE (A) NONE SEEN   Squamous Epithelial / LPF 6-30 (A) NONE SEEN   Mucus PRESENT   CBC     Status: Abnormal   Collection Time: 09/27/17 12:35 PM  Result Value Ref Range   WBC 6.0 4.0 - 10.5 K/uL   RBC 3.28 (L) 3.87 - 5.11 MIL/uL   Hemoglobin 8.2 (L) 12.0 - 15.0 g/dL   HCT 16.124.4 (L) 09.636.0 - 04.546.0 %   MCV 74.4 (L) 78.0 - 100.0 fL   MCH 25.0 (L) 26.0 - 34.0 pg   MCHC 33.6 30.0 - 36.0 g/dL   RDW 40.914.2 81.111.5 -  91.415.5 %   Platelets 344 150 - 400 K/uL  Comprehensive metabolic panel     Status: Abnormal   Collection Time: 09/27/17 12:35 PM  Result Value Ref Range   Sodium 134 (L) 135 - 145 mmol/L   Potassium 2.7 (LL)  3.5 - 5.1 mmol/L   Chloride 102 101 - 111 mmol/L   CO2 23 22 - 32 mmol/L   Glucose, Bld 80 65 - 99 mg/dL   BUN 8 6 - 20 mg/dL   Creatinine, Ser 4.54 0.44 - 1.00 mg/dL   Calcium 8.7 (L) 8.9 - 10.3 mg/dL   Total Protein 6.6 6.5 - 8.1 g/dL   Albumin 2.8 (L) 3.5 - 5.0 g/dL   AST 20 15 - 41 U/L   ALT 9 (L) 14 - 54 U/L   Alkaline Phosphatase 108 38 - 126 U/L   Total Bilirubin 0.8 0.3 - 1.2 mg/dL   GFR calc non Af Amer >60 >60 mL/min   GFR calc Af Amer >60 >60 mL/min   Anion gap 9 5 - 15     MDM CBC CMP Protein/creat ratio UA Fioricet PO Consulted with Dr. Macon Large- will give IV hydralazine for BP control and wait on lab work Hydralazine 10mg  IV  Patient reports complete relief of headache Dr. Macon Large updated- will increase labetalol to 800mg  TID and add Procardia 30xl BID. Will give steroids today and tomorrow BMZ  Assessment and Plan   1. [redacted] weeks gestation of pregnancy   2. Chronic hypertension during pregnancy, antepartum    -Discharge home in stable condition -Rx for labetalol increased and procardia sent to patient's pharmacy -Preeclampsia precautions discussed -Patient advised to follow-up with MAU tomorrow for second BMZ -Patient may return to MAU as needed or if her condition were to change or worsen  Rolm Bookbinder CNM 09/27/2017, 12:15 PM

## 2017-09-28 ENCOUNTER — Inpatient Hospital Stay (HOSPITAL_COMMUNITY)
Admission: AD | Admit: 2017-09-28 | Discharge: 2017-09-28 | Disposition: A | Discharge status: Home / Self Care | Payer: Medicaid Other | Source: Ambulatory Visit | Attending: Obstetrics & Gynecology | Admitting: Obstetrics & Gynecology

## 2017-09-28 DIAGNOSIS — Z3A28 28 weeks gestation of pregnancy: Secondary | ICD-10-CM | POA: Insufficient documentation

## 2017-09-28 DIAGNOSIS — O10013 Pre-existing essential hypertension complicating pregnancy, third trimester: Secondary | ICD-10-CM | POA: Diagnosis present

## 2017-09-28 MED ORDER — BETAMETHASONE SOD PHOS & ACET 6 (3-3) MG/ML IJ SUSP
12.0000 mg | Freq: Once | INTRAMUSCULAR | Status: AC
  Administered 2017-09-28: 12 mg via INTRAMUSCULAR
  Filled 2017-09-28: qty 2

## 2017-10-03 ENCOUNTER — Other Ambulatory Visit: Payer: Self-pay

## 2017-10-03 ENCOUNTER — Other Ambulatory Visit: Payer: Medicaid Other

## 2017-10-03 ENCOUNTER — Encounter: Payer: Self-pay | Admitting: Obstetrics & Gynecology

## 2017-10-03 ENCOUNTER — Ambulatory Visit (INDEPENDENT_AMBULATORY_CARE_PROVIDER_SITE_OTHER): Payer: Medicaid Other | Admitting: Obstetrics & Gynecology

## 2017-10-03 VITALS — BP 144/103 | HR 79 | Wt 171.3 lb

## 2017-10-03 DIAGNOSIS — O10919 Unspecified pre-existing hypertension complicating pregnancy, unspecified trimester: Secondary | ICD-10-CM

## 2017-10-03 DIAGNOSIS — Z23 Encounter for immunization: Secondary | ICD-10-CM

## 2017-10-03 DIAGNOSIS — O10913 Unspecified pre-existing hypertension complicating pregnancy, third trimester: Secondary | ICD-10-CM

## 2017-10-03 DIAGNOSIS — O0990 Supervision of high risk pregnancy, unspecified, unspecified trimester: Secondary | ICD-10-CM

## 2017-10-03 DIAGNOSIS — O0993 Supervision of high risk pregnancy, unspecified, third trimester: Secondary | ICD-10-CM | POA: Diagnosis not present

## 2017-10-03 MED ORDER — HYDRALAZINE HCL 10 MG PO TABS: 10.0000 mg | ORAL_TABLET | Freq: Three times a day (TID) | ORAL | 2 refills | Status: DC

## 2017-10-03 MED ORDER — LABETALOL HCL 200 MG PO TABS: 800.0000 mg | ORAL_TABLET | Freq: Three times a day (TID) | ORAL | 0 refills | Status: DC

## 2017-10-03 NOTE — Progress Notes (Signed)
   PRENATAL VISIT NOTE  Subjective:  Eileen Miller is a 22 y.o. G2P0101 at 8230w4d being seen today for ongoing prenatal care.  She is currently monitored for the following issues for this high-risk pregnancy and has HTN (hypertension); History of cesarean section; History of preterm delivery; Chronic hypertension during pregnancy, antepartum; Supervision of high risk pregnancy, antepartum; and Anemia on their problem list.  Patient reports leg swelling.  Contractions: Irritability. Vag. Bleeding: None.  Movement: Present. Denies leaking of fluid.   The following portions of the patient's history were reviewed and updated as appropriate: allergies, current medications, past family history, past medical history, past social history, past surgical history and problem list. Problem list updated.  Objective:   Vitals:   10/03/17 0921 10/03/17 0923  BP: (!) 141/103 (!) 144/103  Pulse: 76 79  Weight: 171 lb 4.8 oz (77.7 kg)     Fetal Status: Fetal Heart Rate (bpm): 150   Movement: Present     General:  Alert, oriented and cooperative. Patient is in no acute distress.  Skin: Skin is warm and dry. No rash noted.   Cardiovascular: Normal heart rate noted  Respiratory: Normal respiratory effort, no problems with respiration noted  Abdomen: Soft, gravid, appropriate for gestational age.  Pain/Pressure: Absent     Pelvic: Cervical exam deferred        Extremities: Normal range of motion.  Edema: Trace  Mental Status:  Normal mood and affect. Normal behavior. Normal judgment and thought content.   Assessment and Plan:  Pregnancy: G2P0101 at 5530w4d  1. Chronic hypertension during pregnancy, antepartum Add new medication for control - hydrALAZINE (APRESOLINE) 10 MG tablet; Take 1 tablet (10 mg total) by mouth 3 (three) times daily.  Dispense: 90 tablet; Refill: 2  2. Supervision of high risk pregnancy, antepartum continue - labetalol (NORMODYNE) 200 MG tablet; Take 4 tablets (800 mg total) by  mouth 3 (three) times daily.  Dispense: 360 tablet; Refill: 0  Preterm labor symptoms and general obstetric precautions including but not limited to vaginal bleeding, contractions, leaking of fluid and fetal movement were reviewed in detail with the patient. Please refer to After Visit Summary for other counseling recommendations.  Return in about 1 week (around 10/10/2017).   Scheryl DarterJames Freida Nebel, MD

## 2017-10-03 NOTE — Progress Notes (Signed)
Tdap accepted 10/03/17 IN RIGHT ARM @ 9:20A

## 2017-10-03 NOTE — Patient Instructions (Signed)

## 2017-10-04 LAB — GLUCOSE TOLERANCE, 2 HOURS W/ 1HR
GLUCOSE, 1 HOUR: 142 mg/dL (ref 65–179)
GLUCOSE, 2 HOUR: 116 mg/dL (ref 65–152)
GLUCOSE, FASTING: 76 mg/dL (ref 65–91)

## 2017-10-08 ENCOUNTER — Encounter (HOSPITAL_COMMUNITY): Payer: Self-pay

## 2017-10-10 ENCOUNTER — Encounter: Payer: Medicaid Other | Admitting: Obstetrics & Gynecology

## 2017-10-11 ENCOUNTER — Other Ambulatory Visit: Payer: Self-pay | Admitting: Obstetrics & Gynecology

## 2017-10-16 ENCOUNTER — Inpatient Hospital Stay (HOSPITAL_COMMUNITY)
Admission: AD | Admit: 2017-10-16 | Discharge: 2017-10-23 | DRG: 787 | Disposition: A | Discharge status: Home / Self Care | Payer: Medicaid Other | Source: Ambulatory Visit | Attending: Obstetrics & Gynecology | Admitting: Obstetrics & Gynecology

## 2017-10-16 ENCOUNTER — Other Ambulatory Visit: Payer: Self-pay

## 2017-10-16 ENCOUNTER — Encounter (HOSPITAL_COMMUNITY): Payer: Self-pay

## 2017-10-16 ENCOUNTER — Ambulatory Visit (HOSPITAL_COMMUNITY)
Admission: RE | Admit: 2017-10-16 | Discharge: 2017-10-16 | Disposition: A | Discharge status: Home / Self Care | Payer: Medicaid Other | Source: Ambulatory Visit | Attending: Obstetrics & Gynecology | Admitting: Obstetrics & Gynecology

## 2017-10-16 ENCOUNTER — Other Ambulatory Visit (HOSPITAL_COMMUNITY): Payer: Self-pay | Admitting: Obstetrics and Gynecology

## 2017-10-16 DIAGNOSIS — O99284 Endocrine, nutritional and metabolic diseases complicating childbirth: Secondary | ICD-10-CM | POA: Diagnosis present

## 2017-10-16 DIAGNOSIS — O9081 Anemia of the puerperium: Secondary | ICD-10-CM | POA: Diagnosis not present

## 2017-10-16 DIAGNOSIS — D62 Acute posthemorrhagic anemia: Secondary | ICD-10-CM | POA: Diagnosis not present

## 2017-10-16 DIAGNOSIS — Z362 Encounter for other antenatal screening follow-up: Secondary | ICD-10-CM

## 2017-10-16 DIAGNOSIS — O34219 Maternal care for unspecified type scar from previous cesarean delivery: Secondary | ICD-10-CM

## 2017-10-16 DIAGNOSIS — E876 Hypokalemia: Secondary | ICD-10-CM | POA: Diagnosis present

## 2017-10-16 DIAGNOSIS — O1413 Severe pre-eclampsia, third trimester: Secondary | ICD-10-CM | POA: Diagnosis not present

## 2017-10-16 DIAGNOSIS — O321XX0 Maternal care for breech presentation, not applicable or unspecified: Secondary | ICD-10-CM | POA: Diagnosis present

## 2017-10-16 DIAGNOSIS — Z3A32 32 weeks gestation of pregnancy: Secondary | ICD-10-CM | POA: Diagnosis not present

## 2017-10-16 DIAGNOSIS — O10919 Unspecified pre-existing hypertension complicating pregnancy, unspecified trimester: Secondary | ICD-10-CM

## 2017-10-16 DIAGNOSIS — O10013 Pre-existing essential hypertension complicating pregnancy, third trimester: Secondary | ICD-10-CM

## 2017-10-16 DIAGNOSIS — O34211 Maternal care for low transverse scar from previous cesarean delivery: Secondary | ICD-10-CM | POA: Diagnosis present

## 2017-10-16 DIAGNOSIS — O113 Pre-existing hypertension with pre-eclampsia, third trimester: Secondary | ICD-10-CM | POA: Diagnosis not present

## 2017-10-16 DIAGNOSIS — O114 Pre-existing hypertension with pre-eclampsia, complicating childbirth: Secondary | ICD-10-CM | POA: Diagnosis present

## 2017-10-16 DIAGNOSIS — Z3A31 31 weeks gestation of pregnancy: Secondary | ICD-10-CM | POA: Diagnosis not present

## 2017-10-16 DIAGNOSIS — O09213 Supervision of pregnancy with history of pre-term labor, third trimester: Secondary | ICD-10-CM

## 2017-10-16 DIAGNOSIS — O1002 Pre-existing essential hypertension complicating childbirth: Secondary | ICD-10-CM | POA: Diagnosis present

## 2017-10-16 DIAGNOSIS — Z8751 Personal history of pre-term labor: Secondary | ICD-10-CM

## 2017-10-16 DIAGNOSIS — Z98891 History of uterine scar from previous surgery: Secondary | ICD-10-CM

## 2017-10-16 DIAGNOSIS — O119 Pre-existing hypertension with pre-eclampsia, unspecified trimester: Secondary | ICD-10-CM

## 2017-10-16 LAB — URINALYSIS, ROUTINE W REFLEX MICROSCOPIC
BILIRUBIN URINE: NEGATIVE
Glucose, UA: 50 mg/dL — AB
Hgb urine dipstick: NEGATIVE
Ketones, ur: NEGATIVE mg/dL
Leukocytes, UA: NEGATIVE
Nitrite: NEGATIVE
PH: 7 (ref 5.0–8.0)
Protein, ur: 100 mg/dL — AB
SPECIFIC GRAVITY, URINE: 1.02 (ref 1.005–1.030)

## 2017-10-16 LAB — TYPE AND SCREEN
ABO/RH(D): O POS
ANTIBODY SCREEN: NEGATIVE

## 2017-10-16 LAB — COMPREHENSIVE METABOLIC PANEL
ALK PHOS: 151 U/L — AB (ref 38–126)
ALT: 11 U/L — ABNORMAL LOW (ref 14–54)
AST: 23 U/L (ref 15–41)
Albumin: 2.8 g/dL — ABNORMAL LOW (ref 3.5–5.0)
Anion gap: 10 (ref 5–15)
BILIRUBIN TOTAL: 0.8 mg/dL (ref 0.3–1.2)
BUN: 9 mg/dL (ref 6–20)
CALCIUM: 8.8 mg/dL — AB (ref 8.9–10.3)
CO2: 23 mmol/L (ref 22–32)
Chloride: 100 mmol/L — ABNORMAL LOW (ref 101–111)
Creatinine, Ser: 0.79 mg/dL (ref 0.44–1.00)
GFR calc Af Amer: >60 mL/min (ref >60)
GFR calc non Af Amer: >60 mL/min (ref >60)
GLUCOSE: 75 mg/dL (ref 65–99)
POTASSIUM: 2.7 mmol/L — AB (ref 3.5–5.1)
Sodium: 133 mmol/L — ABNORMAL LOW (ref 135–145)
TOTAL PROTEIN: 6.6 g/dL (ref 6.5–8.1)

## 2017-10-16 LAB — CBC
HCT: 28.2 % — ABNORMAL LOW (ref 36.0–46.0)
Hemoglobin: 9 g/dL — ABNORMAL LOW (ref 12.0–15.0)
MCH: 23.9 pg — ABNORMAL LOW (ref 26.0–34.0)
MCHC: 31.9 g/dL (ref 30.0–36.0)
MCV: 75 fL — ABNORMAL LOW (ref 78.0–100.0)
PLATELETS: 171 10*3/uL (ref 150–400)
RBC: 3.76 MIL/uL — ABNORMAL LOW (ref 3.87–5.11)
RDW: 16.4 % — AB (ref 11.5–15.5)
WBC: 4 10*3/uL (ref 4.0–10.5)

## 2017-10-16 LAB — PROTEIN / CREATININE RATIO, URINE
CREATININE, URINE: 289 mg/dL
PROTEIN CREATININE RATIO: 2.29 mg/mg{creat} — AB (ref 0.00–0.15)
TOTAL PROTEIN, URINE: 662 mg/dL

## 2017-10-16 MED ORDER — LORATADINE 10 MG PO TABS
10.0000 mg | ORAL_TABLET | Freq: Every day | ORAL | Status: DC
  Administered 2017-10-17 – 2017-10-19 (×2): 10 mg via ORAL
  Filled 2017-10-16 (×6): qty 1

## 2017-10-16 MED ORDER — POTASSIUM CHLORIDE CRYS ER 20 MEQ PO TBCR
40.0000 meq | EXTENDED_RELEASE_TABLET | Freq: Two times a day (BID) | ORAL | Status: DC
  Administered 2017-10-16: 40 meq via ORAL
  Filled 2017-10-16 (×2): qty 2

## 2017-10-16 MED ORDER — DOCUSATE SODIUM 100 MG PO CAPS
100.0000 mg | ORAL_CAPSULE | Freq: Every day | ORAL | Status: DC | PRN
  Filled 2017-10-16: qty 1

## 2017-10-16 MED ORDER — LACTATED RINGERS IV SOLN
INTRAVENOUS | Status: DC
  Administered 2017-10-16 – 2017-10-19 (×6): via INTRAVENOUS

## 2017-10-16 MED ORDER — PRENATAL MULTIVITAMIN CH
1.0000 | ORAL_TABLET | Freq: Every day | ORAL | Status: DC
  Administered 2017-10-17 – 2017-10-19 (×3): 1 via ORAL
  Filled 2017-10-16 (×4): qty 1

## 2017-10-16 MED ORDER — NIFEDIPINE ER OSMOTIC RELEASE 30 MG PO TB24
30.0000 mg | ORAL_TABLET | Freq: Two times a day (BID) | ORAL | Status: DC
Start: 2017-10-16 — End: 2017-10-22
  Administered 2017-10-16 – 2017-10-22 (×12): 30 mg via ORAL
  Filled 2017-10-16 (×12): qty 1

## 2017-10-16 MED ORDER — HYDRALAZINE HCL 10 MG PO TABS
10.0000 mg | ORAL_TABLET | Freq: Three times a day (TID) | ORAL | Status: DC
  Administered 2017-10-16 – 2017-10-22 (×17): 10 mg via ORAL
  Filled 2017-10-16 (×18): qty 1

## 2017-10-16 MED ORDER — MAGNESIUM SULFATE 40 G IN LACTATED RINGERS - SIMPLE
2.0000 g/h | INTRAVENOUS | Status: DC
  Administered 2017-10-16 – 2017-10-17 (×2): 2 g/h via INTRAVENOUS
  Filled 2017-10-16: qty 40
  Filled 2017-10-16: qty 500
  Filled 2017-10-16: qty 40

## 2017-10-16 MED ORDER — MAGNESIUM SULFATE BOLUS VIA INFUSION
4.0000 g | Freq: Once | INTRAVENOUS | Status: AC
  Administered 2017-10-16: 4 g via INTRAVENOUS
  Filled 2017-10-16: qty 500

## 2017-10-16 MED ORDER — HYDRALAZINE HCL 20 MG/ML IJ SOLN
10.0000 mg | Freq: Once | INTRAMUSCULAR | Status: AC
  Administered 2017-10-16: 10 mg via INTRAVENOUS
  Filled 2017-10-16: qty 1

## 2017-10-16 MED ORDER — HYDRALAZINE HCL 20 MG/ML IJ SOLN
10.0000 mg | Freq: Once | INTRAMUSCULAR | Status: AC | PRN
  Administered 2017-10-16: 10 mg via INTRAVENOUS
  Filled 2017-10-16: qty 1

## 2017-10-16 MED ORDER — ZOLPIDEM TARTRATE 5 MG PO TABS: 5.0000 mg | ORAL_TABLET | Freq: Every evening | ORAL | Status: DC | PRN

## 2017-10-16 MED ORDER — CALCIUM CARBONATE ANTACID 500 MG PO CHEW: 2.0000 | CHEWABLE_TABLET | ORAL | Status: DC | PRN

## 2017-10-16 MED ORDER — LABETALOL HCL 5 MG/ML IV SOLN
20.0000 mg | INTRAVENOUS | Status: DC | PRN
  Administered 2017-10-16: 20 mg via INTRAVENOUS
  Filled 2017-10-16: qty 4
  Filled 2017-10-16: qty 8

## 2017-10-16 MED ORDER — ACETAMINOPHEN 325 MG PO TABS: 650.0000 mg | ORAL_TABLET | ORAL | Status: DC | PRN

## 2017-10-16 MED ORDER — BETAMETHASONE SOD PHOS & ACET 6 (3-3) MG/ML IJ SUSP
12.0000 mg | INTRAMUSCULAR | Status: AC
  Administered 2017-10-16 – 2017-10-17 (×2): 12 mg via INTRAMUSCULAR
  Filled 2017-10-16 (×2): qty 2

## 2017-10-16 MED ORDER — LABETALOL HCL 200 MG PO TABS
800.0000 mg | ORAL_TABLET | Freq: Three times a day (TID) | ORAL | Status: DC
  Administered 2017-10-16 – 2017-10-23 (×20): 800 mg via ORAL
  Filled 2017-10-16 (×3): qty 4
  Filled 2017-10-16: qty 8
  Filled 2017-10-16 (×8): qty 4
  Filled 2017-10-16 (×2): qty 8
  Filled 2017-10-16 (×7): qty 4

## 2017-10-16 NOTE — MAU Note (Signed)
Pt sent from MFM for BP eval.  Pt has HXx CHTN and currently taking Labetalol and Procardia.  Reports took meds today.  Denies H/A, visual disturbances or epigastric pain. Denies VB/discharge or LOF.  Reports +FM.

## 2017-10-16 NOTE — H&P (Signed)
Eileen Miller is a 22 y.o. female presenting for Antenatal admission for Chronic HTN with severe range BP's after being seen in MFM for u/s and noted to have severe range pressures of 180 systolic, despite being on 3 meds at home.  Pt was transferred to MAU where pressures improved slightly but still were severe range , so pt admitted for BMZ, Mag Sulfate and continued on the 3 meds. Pt is a prior cesarean x 1  And currently plans for repeat cesarean. She has been again offered TOLAC and she's currently not interested.  Since admission pressures are improved  Temp:  [97.5 F (36.4 C)-98.4 F (36.9 C)] 97.7 F (36.5 C) (03/13 2114) Pulse Rate:  [61-90] 79 (03/13 2201) Resp:  [16-18] 16 (03/13 2201) BP: (140-200)/(91-138) 149/98 (03/13 2201) SpO2:  [96 %-100 %] 100 % (03/13 2114) Weight:  [172 lb 12 oz (78.4 kg)-174 lb (78.9 kg)] 172 lb 12 oz (78.4 kg) (03/13 1526)  Most recently BP (!) 149/98 (BP Location: Left Arm)   Pulse 79   Temp 97.7 F (36.5 C) (Oral)   Resp 16   Ht 5\' 7"  (1.702 m)   Wt 172 lb 12 oz (78.4 kg)   LMP 02/06/2017   SpO2 100%   BMI 27.06 kg/m  . OB History    Gravida Para Term Preterm AB Living   2 1   1   1    SAB TAB Ectopic Multiple Live Births           1     Past Medical History:  Diagnosis Date  . History of cesarean section 03/03/2016   For severe Preeclampsia. Did not labor.   . History of preterm delivery 03/03/2016   For severe Preeclampsia. Did not labor.   . Hypertension   . Severe preeclampsia 2015   Past Surgical History:  Procedure Laterality Date  . CESAREAN SECTION    . EYE SURGERY    . HERNIA REPAIR     Family History: family history includes Hypertension in her father, maternal grandmother, and paternal grandmother. Social History:  reports that  has never smoked. she has never used smokeless tobacco. She reports that she does not drink alcohol or use drugs.     Maternal Diabetes: No Genetic Screening: Normal Maternal  Ultrasounds/Referrals: Normal EFW 30%ile (3/13) Fetal Ultrasounds or other Referrals:  None Maternal Substance Abuse:  No Significant Maternal Medications:  Meds include: Other: procardia, labetalol,  Significant Maternal Lab Results:  Lab values include: Other: plts 171 down from 344 Other Comments:  None  ROSdenies h/a scotoma ruq pain. Was assymptomatic at time of visit. Pt reports compliance with BP meds HistoryPrior c/s at 35 wk [place] for preE.    Blood pressure (!) 149/98, pulse 79, temperature 97.7 F (36.5 C), temperature source Oral, resp. rate 16, height 5\' 7"  (1.702 m), weight 172 lb 12 oz (78.4 kg), last menstrual period 02/06/2017, SpO2 100 %, unknown if currently breastfeeding. Exam Physical Exam  Constitutional: She is oriented to person, place, and time. She appears well-developed and well-nourished.  Eyes: Pupils are equal, round, and reactive to light.  Neck: Normal range of motion. Neck supple.  Cardiovascular: Normal rate.  GI: Soft.  Gravid uterus c/w dates. U/s today EFW 3+8 lb (30%).  Neurological: She is alert and oriented to person, place, and time. She has normal reflexes.   No clonus. Prenatal labs: ABO, Rh: --/--/O POS (03/13 1545) Antibody: NEG (03/13 1545) Rubella: 4.44 (09/25 1426) RPR: Non  Reactive (09/25 1426)  HBsAg: Negative (09/25 1426)  HIV: Non Reactive (09/25 1426)  GBS:     Assessment/Plan: preg 31w3 d G2P0101 Chronic HTN with severe range pressure despite 3 meds. Prior cesarean x 1 declining tolac.   Plan : Admit, Mag Sulfate, Betamethasone q 24 h x 2, second dose 5 pm Thursday       Continue procardia xl 30 bid, labetalol 800 tid, Apresoline 10 tid.        Can increase procardia to 60 bid if pressures remain high.        Will attempt to gain time, give steroids, and if pressures return to severe range on 3 meds on maximum dosing, likely would proceed with repeat cesaran         Tilda BurrowJohn V Caylin Raby 10/16/2017, 10:34 PM

## 2017-10-16 NOTE — MAU Note (Signed)
CRITICAL VALUE ALERT  Critical Value:  Potassium 2.7  Date & Time Notied:  10/16/17 @ 1618   Provider Notified: Dr. Macon LargeAnyanwu verbally informed on 10/16/17 @ 1619.  Orders Received/Actions taken: MD @ bedside, no orders given @ this time.

## 2017-10-16 NOTE — ED Notes (Signed)
Dr. Carney BernNitche notified of BP's at check-in. Patient states she just took her meds prior to leaving for appt. Patient states she has had a cold. Denies HA, blurred vision, RUQ pain.

## 2017-10-16 NOTE — MAU Provider Note (Signed)
History     CSN: [id number]  Arrival date and time: 10/16/17 1514   First Provider Initiated Contact with Patient 10/16/17 1548      Chief Complaint  Patient presents with  . Hypertension   HPI Eileen Miller is a 22 y.o. G2P0101 at [redacted]w[redacted]d who presents from MFM for BP evaluation. Hx significant for severe preeclampsia in previous pregnancy & chronic hypertension. States she currently is taking 3 different antihypertensives and last took all of her medication this morning. Was in MFM & had severe range BPs. Denies headache, visual disturbance, epigastric pain, CP, SOB, or significant weight gain. Denies abdominal pain. Positive fetal movement.   OB History    Gravida Para Term Preterm AB Living   2 1   1   1    SAB TAB Ectopic Multiple Live Births           1      Past Medical History:  Diagnosis Date  . History of cesarean section 03/03/2016   For severe Preeclampsia. Did not labor.   . History of preterm delivery 03/03/2016   For severe Preeclampsia. Did not labor.   . Hypertension   . Severe preeclampsia 2015    Past Surgical History:  Procedure Laterality Date  . CESAREAN SECTION    . EYE SURGERY    . HERNIA REPAIR      Family History  Problem Relation Age of Onset  . Hypertension Father   . Hypertension Maternal Grandmother   . Hypertension Paternal Grandmother     Social History   Tobacco Use  . Smoking status: Never Smoker  . Smokeless tobacco: Never Used  Substance Use Topics  . Alcohol use: No  . Drug use: No    Allergies:  Allergies  Allergen Reactions  . Food Swelling and Other (See Comments)    Pt states that she is allergic to peanut butter.   Reaction:  Tongue swelling     Medications Prior to Admission  Medication Sig Dispense Refill Last Dose  . acetaminophen (TYLENOL) 325 MG tablet Take 650 mg by mouth every 6 (six) hours as needed for headache.   Past Month at Unknown time  . aspirin EC 81 MG tablet Take 1 tablet (81 mg total) by  mouth daily. Take after 12 weeks for prevention of preeclampsia later in pregnancy 300 tablet 2 10/16/2017 at Unknown time  . hydrALAZINE (APRESOLINE) 10 MG tablet Take 1 tablet (10 mg total) by mouth 3 (three) times daily. 90 tablet 2 10/16/2017 at 0800  . labetalol (NORMODYNE) 200 MG tablet Take 4 tablets (800 mg total) by mouth 3 (three) times daily. 360 tablet 0 10/16/2017 at 1330  . NIFEdipine (PROCARDIA XL) 30 MG 24 hr tablet Take 1 tablet (30 mg total) by mouth 2 (two) times daily. 60 tablet 1 10/16/2017 at 1330  . Prenatal MV-Min-FA-Omega-3 (PRENATAL GUMMIES/DHA & FA PO) Take 1 tablet by mouth daily.   10/16/2017 at Unknown time  . ferrous sulfate (FERROUSUL) 325 (65 FE) MG tablet Take 1 tablet (325 mg total) by mouth 2 (two) times daily. (Patient not taking: Reported on 10/16/2017) 60 tablet 11 Not Taking at Unknown time  . potassium chloride (K-DUR) 10 MEQ tablet Take 2 tablets (20 mEq total) by mouth daily. (Patient not taking: Reported on 10/16/2017) 7 tablet 0 Not Taking at Unknown time    Review of Systems  Constitutional: Negative.   Eyes: Negative.   Respiratory: Negative.   Cardiovascular: Negative.   Gastrointestinal: Negative.  Neurological: Negative.    Physical Exam   Blood pressure (!) 197/136, pulse 63, temperature 98.4 F (36.9 C), temperature source Oral, resp. rate 18, height 5\' 7"  (1.702 m), weight 172 lb 12 oz (78.4 kg), last menstrual period 02/06/2017, SpO2 96 %, unknown if currently breastfeeding. Patient Vitals for the past 24 hrs:  BP Temp Temp src Pulse Resp SpO2 Height Weight  10/16/17 1608 (!) 197/136 - - 63 - - - -  10/16/17 1600 (!) 200/130 - - 61 - - - -  10/16/17 1545 (!) 190/130 - - 67 - - - -  10/16/17 1539 (!) 178/129 - - 67 - - - -  10/16/17 1526 (!) 178/128 98.4 F (36.9 C) Oral 70 18 96 % 5\' 7"  (1.702 m) 172 lb 12 oz (78.4 kg)    Physical Exam  Nursing note and vitals reviewed. Constitutional: She is oriented to person, place, and time. She  appears well-developed and well-nourished. No distress.  HENT:  Head: Normocephalic and atraumatic.  Eyes: Conjunctivae are normal. Right eye exhibits no discharge. Left eye exhibits no discharge. No scleral icterus.  Neck: Normal range of motion.  Cardiovascular: Normal rate, regular rhythm and normal heart sounds.  No murmur heard. Respiratory: Effort normal and breath sounds normal. No respiratory distress. She has no wheezes.  GI: Soft. There is no tenderness.  Neurological: She is alert and oriented to person, place, and time. She has normal reflexes.  No clonus  Skin: Skin is warm and dry. She is not diaphoretic.  Psychiatric: She has a normal mood and affect. Her behavior is normal. Judgment and thought content normal.    MAU Course  Procedures Results for orders placed or performed during the hospital encounter of 10/16/17 (from the past 24 hour(s))  CBC     Status: Abnormal   Collection Time: 10/16/17  3:45 PM  Result Value Ref Range   WBC 4.0 4.0 - 10.5 K/uL   RBC 3.76 (L) 3.87 - 5.11 MIL/uL   Hemoglobin 9.0 (L) 12.0 - 15.0 g/dL   HCT 40.928.2 (L) 81.136.0 - 91.446.0 %   MCV 75.0 (L) 78.0 - 100.0 fL   MCH 23.9 (L) 26.0 - 34.0 pg   MCHC 31.9 30.0 - 36.0 g/dL   RDW 78.216.4 (H) 95.611.5 - 21.315.5 %   Platelets 171 150 - 400 K/uL    MDM NST:  Baseline: 135 bpm, Variability: Good {> 6 bpm), Accelerations: Reactive and Decelerations: Absent  CBC, CMP, urine PCR ordered IV antihypertensive protocol ordered for persistent severe range BPs Dr. Macon LargeAnyanwu updated with patient's assessment & BPs. Will give IV hydral, BMZ, & mag sulfate 4/2. She will come speak with patient regarding plans for admission.   Assessment and Plan  A: 1. Chronic hypertension during pregnancy, antepartum    P; Orders for admission per Dr. Nena AlexanderAnyanwu   Jung Yurchak 10/16/2017, 3:48 PM

## 2017-10-16 NOTE — Progress Notes (Signed)
Orders placed for three antihypertension meds, Labetalol 800 tid, Procardia XL 30 bid, and Apresoline 10 bid.

## 2017-10-17 ENCOUNTER — Other Ambulatory Visit: Payer: Self-pay

## 2017-10-17 DIAGNOSIS — Z3A31 31 weeks gestation of pregnancy: Secondary | ICD-10-CM

## 2017-10-17 DIAGNOSIS — O113 Pre-existing hypertension with pre-eclampsia, third trimester: Secondary | ICD-10-CM

## 2017-10-17 DIAGNOSIS — E876 Hypokalemia: Secondary | ICD-10-CM | POA: Diagnosis present

## 2017-10-17 LAB — COMPREHENSIVE METABOLIC PANEL
ALT: 10 U/L — ABNORMAL LOW (ref 14–54)
ANION GAP: 13 (ref 5–15)
AST: 10 U/L — ABNORMAL LOW (ref 15–41)
Albumin: 2.7 g/dL — ABNORMAL LOW (ref 3.5–5.0)
Alkaline Phosphatase: 141 U/L — ABNORMAL HIGH (ref 38–126)
BILIRUBIN TOTAL: 0.9 mg/dL (ref 0.3–1.2)
BUN: 11 mg/dL (ref 6–20)
CO2: 20 mmol/L — ABNORMAL LOW (ref 22–32)
Calcium: 7.9 mg/dL — ABNORMAL LOW (ref 8.9–10.3)
Chloride: 98 mmol/L — ABNORMAL LOW (ref 101–111)
Creatinine, Ser: 0.96 mg/dL (ref 0.44–1.00)
GFR calc Af Amer: >60 mL/min (ref >60)
Glucose, Bld: 103 mg/dL — ABNORMAL HIGH (ref 65–99)
POTASSIUM: 2.6 mmol/L — AB (ref 3.5–5.1)
Sodium: 131 mmol/L — ABNORMAL LOW (ref 135–145)
TOTAL PROTEIN: 6.7 g/dL (ref 6.5–8.1)

## 2017-10-17 LAB — CBC
HEMATOCRIT: 28.3 % — AB (ref 36.0–46.0)
Hemoglobin: 9.1 g/dL — ABNORMAL LOW (ref 12.0–15.0)
MCH: 24.1 pg — ABNORMAL LOW (ref 26.0–34.0)
MCHC: 32.2 g/dL (ref 30.0–36.0)
MCV: 74.9 fL — AB (ref 78.0–100.0)
Platelets: 184 10*3/uL (ref 150–400)
RBC: 3.78 MIL/uL — ABNORMAL LOW (ref 3.87–5.11)
RDW: 16.8 % — AB (ref 11.5–15.5)
WBC: 5.8 10*3/uL (ref 4.0–10.5)

## 2017-10-17 MED ORDER — POTASSIUM CHLORIDE CRYS ER 20 MEQ PO TBCR
40.0000 meq | EXTENDED_RELEASE_TABLET | Freq: Three times a day (TID) | ORAL | Status: DC
  Administered 2017-10-17 – 2017-10-18 (×5): 40 meq via ORAL
  Filled 2017-10-17 (×7): qty 2

## 2017-10-17 MED ORDER — ONDANSETRON HCL 4 MG/2ML IJ SOLN
4.0000 mg | Freq: Four times a day (QID) | INTRAMUSCULAR | Status: DC | PRN
  Administered 2017-10-17 – 2017-10-18 (×2): 4 mg via INTRAVENOUS
  Filled 2017-10-17 (×2): qty 2

## 2017-10-17 NOTE — Progress Notes (Signed)
Contact with Dr. Macon LargeAnyanwu to clarify magnesium discontinue order. Instructed to keep magnesium running for 24 hours after second steroid injection which is this evening 3/14@1700 . Order modified in the computer. Carmelina DaneERRI L Codylee Patil, RN

## 2017-10-17 NOTE — Progress Notes (Signed)
Critical lab value of potassium 2.6 called in by lab. Informed Dr. Emelda FearFerguson, will place new orders.

## 2017-10-17 NOTE — Progress Notes (Signed)
Pt vomiting x2 . Pt denies any epigastric pain or abdominal pain in general. Dr. Macon LargeAnyanwu contacted. Order received for Zofran. Carmelina DaneERRI L Edgard Debord, RN

## 2017-10-17 NOTE — Progress Notes (Signed)
Contact with Dr. Emelda FearFerguson in regards to decreased variability of FHR. Dr. Emelda FearFerguson has reviewed FHR strip. No new orders given at this time. Continue with current care plan. Will continue to monitor. Carmelina DaneERRI L Phi Avans, RN

## 2017-10-17 NOTE — Progress Notes (Signed)
[place] COMPREHENSIVE PROGRESS NOTE  Eileen Miller is a 22 y.o. G2P0101 at 15w4dwho [facility]  Estimated Date of Delivery: 12/15/17 Fetal presentation is cephalic.  Length of Stay:  1 Days. Admitted 10/16/2017  Subjective: Denies any headaches, visual changes, RUQ/epigastric pain. Patient reports good fetal movement.  She reports no uterine contractions, no bleeding and no loss of fluid per vagina.  Vitals:  Blood pressure (!) 148/95, pulse 88, temperature (!) 97.4 F (36.3 C), temperature source Oral, resp. rate 18, height 5' 7"  (1.702 m), weight 172 lb 12 oz (78.4 kg), last menstrual period 02/06/2017, SpO2 100 %, unknown if currently breastfeeding. Physical Examination: CONSTITUTIONAL: Well-developed, well-nourished female in no acute distress.  HENT:  Normocephalic, atraumatic, External right and left ear normal. Oropharynx is clear and moist EYES: Conjunctivae and EOM are normal. Pupils are equal, round, and reactive to light. No scleral icterus.  NECK: Normal range of motion, supple, no masses SKIN: Skin is warm and dry. No rash noted. Not diaphoretic. No erythema. No pallor. [place] Alert and oriented to person, place, and time. Normal reflexes, muscle tone coordination. No cranial nerve deficit noted. PSYCHIATRIC: Normal mood and affect. Normal behavior. Normal judgment and thought content. CARDIOVASCULAR: Normal heart rate noted, regular rhythm RESPIRATORY: Effort and breath sounds normal, no problems with respiration noted MUSCULOSKELETAL: Normal range of motion. No edema and no tenderness. 2+ distal pulses. ABDOMEN: Soft, nontender, nondistended, gravid. CERVIX:  Deferred  Fetal monitoring: FHR: 135 bpm, Variability: moderate, 10 x 10 Accelerations: Present, Decelerations: Absent  Uterine activity: No contractions  Results for orders placed or performed during the hospital encounter of 10/16/17 (from  the past 48 hour(s))  Protein / creatinine ratio, urine     Status: Abnormal   Collection Time: 10/16/17  3:30 PM  Result Value Ref Range   Creatinine, Urine 289.00 mg/dL   Total Protein, Urine 662 mg/dL    Comment: NO NORMAL RANGE ESTABLISHED FOR THIS TEST RESULTS CONFIRMED BY MANUAL DILUTION    Protein Creatinine Ratio 2.29 (H) 0.00 - 0.15 mg/mg[Cre]    Comment: [facility] [address] [place] [address] Urinalysis, Routine w reflex microscopic     Status: Abnormal   Collection Time: 10/16/17  3:30 PM  Result Value Ref Range   Color, Urine AMBER (A) YELLOW    Comment: BIOCHEMICALS MAY BE AFFECTED BY COLOR   APPearance HAZY (A) CLEAR   Specific Gravity, Urine 1.020 1.005 - 1.030   pH 7.0 5.0 - 8.0   Glucose, UA 50 (A) NEGATIVE mg/dL   Hgb urine dipstick NEGATIVE NEGATIVE   Bilirubin Urine NEGATIVE NEGATIVE   Ketones, ur NEGATIVE NEGATIVE mg/dL   Protein, ur 100 (A) NEGATIVE mg/dL   Nitrite NEGATIVE NEGATIVE   Leukocytes, UA NEGATIVE NEGATIVE   RBC / HPF 0-5 0 - 5 RBC/hpf   WBC, UA 6-30 0 - 5 WBC/hpf   Bacteria, UA RARE (A) NONE SEEN   Squamous Epithelial / LPF 6-30 (A) NONE SEEN   Mucus PRESENT     Comment: [facility] [address] [place] [address] Comprehensive metabolic panel     Status: Abnormal   Collection Time: 10/16/17  3:45 PM  Result Value Ref Range   Sodium 133 (L) 135 - 145 mmol/L   Potassium 2.7 (LL) 3.5 - 5.1 mmol/L    Comment: CRITICAL RESULT CALLED TO, READ BACK BY AND VERIFIED WITH: LOWE,J. @ 11308  ON 03.13.19 BY GROOMSC    Chloride 100 (L) 101 - 111 mmol/L   CO2 23 22 - 32 mmol/L   Glucose, Bld 75 65 - 99 mg/dL   BUN 9 6 - 20 mg/dL   Creatinine, Ser 0.79 0.44 - 1.00 mg/dL   Calcium 8.8 (L) 8.9 - 10.3 mg/dL   Total Protein 6.6 6.5 - 8.1 g/dL   Albumin 2.8 (L) 3.5 - 5.0 g/dL   AST 23 15 - 41 U/L   ALT 11 (L) 14 - 54 U/L   Alkaline Phosphatase 151 (H) 38 - 126 U/L   Total Bilirubin 0.8  0.3 - 1.2 mg/dL   GFR calc non Af Amer >60 >60 mL/min   GFR calc Af Amer >60 >60 mL/min    Comment: (NOTE) The eGFR has been calculated using the CKD EPI equation. This calculation has not been validated in all clinical situations. eGFR's persistently <60 mL/min signify possible Chronic Kidney Disease.    Anion gap 10 5 - 15    Comment: Performed at [facility], [address], [place], [place] [address]  CBC     Status: Abnormal   Collection Time: 10/16/17  3:45 PM  Result Value Ref Range   WBC 4.0 4.0 - 10.5 K/uL   RBC 3.76 (L) 3.87 - 5.11 MIL/uL   Hemoglobin 9.0 (L) 12.0 - 15.0 g/dL   HCT 28.2 (L) 36.0 - 46.0 %   MCV 75.0 (L) 78.0 - 100.0 fL   MCH 23.9 (L) 26.0 - 34.0 pg   MCHC 31.9 30.0 - 36.0 g/dL   RDW 16.4 (H) 11.5 - 15.5 %   Platelets 171 150 - 400 K/uL    Comment: Performed at [facility], [address], [place], [place] [address]  Type and screen [place]     Status: None   Collection Time: 10/16/17  3:45 PM  Result Value Ref Range   ABO/RH(D) O POS    Antibody Screen NEG    Sample Expiration      10/19/2017 Performed at [facility], [address], [place], [place] [address]   CBC     Status: Abnormal   Collection Time: 10/17/17  5:21 AM  Result Value Ref Range   WBC 5.8 4.0 - 10.5 K/uL   RBC 3.78 (L) 3.87 - 5.11 MIL/uL   Hemoglobin 9.1 (L) 12.0 - 15.0 g/dL   HCT 28.3 (L) 36.0 - 46.0 %   MCV 74.9 (L) 78.0 - 100.0 fL   MCH 24.1 (L) 26.0 - 34.0 pg   MCHC 32.2 30.0 - 36.0 g/dL   RDW 16.8 (H) 11.5 - 15.5 %   Platelets 184 150 - 400 K/uL    Comment: Performed at [facility], [address] [place], [place] [address]  Comprehensive metabolic panel     Status: Abnormal   Collection Time: 10/17/17  5:21 AM  Result Value Ref Range   Sodium 131 (L) 135 - 145 mmol/L   Potassium 2.6 (LL) 3.5 - 5.1 mmol/L    Comment: CRITICAL RESULT CALLED TO, READ BACK BY AND VERIFIED WITH: RODRIGUEZ,A @0643  ON [id number] BY FLEMINGS     Chloride 98 (L) 101 - 111 mmol/L   CO2 20 (L) 22 - 32 mmol/L   Glucose, Bld 103 (H) 65 - 99 mg/dL   BUN 11 6 - 20 mg/dL   Creatinine, Ser 0.96 0.44 - 1.00 mg/dL   Calcium 7.9 (L) 8.9 - 10.3 mg/dL   Total Protein 6.7  6.5 - 8.1 g/dL   Albumin 2.7 (L) 3.5 - 5.0 g/dL   AST 10 (L) 15 - 41 U/L   ALT 10 (L) 14 - 54 U/L   Alkaline Phosphatase 141 (H) 38 - 126 U/L   Total Bilirubin 0.9 0.3 - 1.2 mg/dL   GFR calc non Af Amer >60 >60 mL/min   GFR calc Af Amer >60 >60 mL/min    Comment: (NOTE) The eGFR has been calculated using the CKD EPI equation. This calculation has not been validated in all clinical situations. eGFR's persistently <60 mL/min signify possible Chronic Kidney Disease.    Anion gap 13 5 - 15    Comment: Performed at [facility], [address], [place], [place] [address]    [place] Mfm Fetal Bpp Wo Non Stress  Result Date: 10/16/2017 ----------------------------------------------------------------------  OBSTETRICS REPORT                      (Signed Final 10/16/2017 03:15 pm) ---------------------------------------------------------------------- Patient Info  ID #:       [id number]                          D.O.B.:  24-Dec-1995 (21 yrs)  Name:       Eileen Miller                 Visit Date: 10/16/2017 02:15 pm ---------------------------------------------------------------------- Performed By  Performed By:     Melba Coon Phy.:   [place] for                                                             [place]  Attending:        Jolyn Lent MD      Address:          [facility]  [place], [place]  Referred By:      Clydene Pugh              Location:         [facility]  Ref. Address:     [place],                    [place] [address] ---------------------------------------------------------------------- Orders   #  Description                                 Code   1  [place] MFM OB FOLLOW UP                         [phone]   2  [place] MFM FETAL BPP WO NON STRESS              76819.01  ----------------------------------------------------------------------   #  Ordered By               Order #        Accession #    Episode #   [place]           [id number]      [id number]     [id number]   2  JEFFREY DENNEY           [id number]      [id number]     [id number]  ---------------------------------------------------------------------- Indications   [redacted] weeks gestation of pregnancy                Z3A.31   Encounter for other antenatal screening        Z36.2   follow-up   Hypertension - Chronic/Pre-existing            O10.019   History of cesarean delivery, currently        O64.219   pregnant   Poor obstetric history: Previous preterm       O09.219   delivery, antepartum (@35wks )  ---------------------------------------------------------------------- OB History  Gravidity:    2  Prem:   1  Living:       1 ---------------------------------------------------------------------- Fetal Evaluation  Num Of Fetuses:     1  Fetal Heart         155  Rate(bpm):  Cardiac Activity:   Observed  Presentation:       Cephalic  Placenta:           Posterior, above cervical os  P. Cord Insertion:  Previously Visualized  Amniotic Fluid  AFI FV:      Subjectively within normal limits  AFI Sum(cm)     %Tile       Largest Pocket(cm)  12.37           34          5  RUQ(cm)       RLQ(cm)       LUQ(cm)        LLQ(cm)  1.98          5              3.91           1.48 ---------------------------------------------------------------------- Biophysical Evaluation  Amniotic F.V:   Pocket => 2 cm two         F. Tone:        Observed                  planes  F. Movement:    Observed                   Score:          8/8  F. Breathing:   Observed ---------------------------------------------------------------------- Biometry  BPD:      79.2  mm     G. Age:  31w 5d         38  %    CI:        75.66   %    70 - 86                                                          FL/HC:      19.9   %    19.1 - 21.3  HC:      288.7  mm     G. Age:  31w 5d         14  %    HC/AC:      1.10        0.96 - 1.17  AC:      262.5  mm     G. Age:  30w 2d         12  %    FL/BPD:     72.5   %    71 - 87  FL:       57.4  mm     G. Age:  30w 0d          5  %    FL/AC:      21.9   %    20 - 24  HUM:      51.2  mm     G. Age:  30w 0d         10  %  Est. FW:    1590  gm  3 lb 8 oz     30  % ---------------------------------------------------------------------- Gestational Age  LMP:           36w 0d        Date:  02/06/17                 EDD:   11/13/17  U/S Today:     31w 0d                                        EDD:   12/18/17  Best:          31w 6d     Det. By:  U/S C R L  (06/10/17)    EDD:   12/12/17 ---------------------------------------------------------------------- Anatomy  Cranium:               Appears normal         Aortic Arch:            Previously seen  Cavum:                 Previously seen        Ductal Arch:            Previously seen  Ventricles:            Appears normal         Diaphragm:              Appears normal  Choroid Plexus:        Previously seen        Stomach:                Appears normal, left                                                                        sided  Cerebellum:            Previously seen        Abdomen:                Appears normal  Posterior Fossa:       Previously seen        Abdominal Wall:         Previously seen  Nuchal  Fold:           Previously seen        Cord Vessels:           Previously seen  Face:                  Orbits and profile     Kidneys:                Appear normal                         previously seen  Lips:                  Previously seen        Bladder:                Appears normal  Thoracic:              Appears normal         Spine:                  Previously seen  Heart:                 Previously seen        Upper Extremities:      Previously seen  RVOT:                  Previously seen        Lower Extremities:      Previously seen  LVOT:                  Previously seen  Other:  Female gender previously seen. Heels and Left 5th digit previously          visualized. ---------------------------------------------------------------------- Cervix Uterus Adnexa  Cervix  Not visualized (advanced GA >29wks) ---------------------------------------------------------------------- Comments  Ms. Casciano's BPs today were 189/138, 167/133, and  186/133.  She is asymptomatic.  After discussing the situation  with Dr. Harolyn Rutherford we recommended that Ms. Marion go the  the MAU for further evaluation.  She agreed. ---------------------------------------------------------------------- Impression  Single living intrauterine pregnancy at 31 weeks 6 days.  Appropriate interval fetal growth (30%).  Normal amniotic fluid volume.  Normal interval fetal anatomy.  BPP 8.8. ---------------------------------------------------------------------- Recommendations  Recommend follow-up ultrasound examination in 4 weeks to  reassess fetal growth.  Recommend weekly fetal testing. ----------------------------------------------------------------------                 Jolyn Lent, MD Electronically Signed Final Report   10/16/2017 03:15 pm ----------------------------------------------------------------------  [place] Mfm Ob Follow Up  Result Date: 10/16/2017 ----------------------------------------------------------------------  OBSTETRICS  REPORT                      (Signed Final 10/16/2017 03:15 pm) ---------------------------------------------------------------------- Patient Info  ID #:       [id number]                          D.O.B.:  Aug 10, 1995 (21 yrs)  Name:       Eileen Miller                 Visit Date: 10/16/2017 02:15 pm ---------------------------------------------------------------------- Performed By  Performed By:     Melba Coon Phy.:   [place] for                                                             [facility]  Attending:        Jolyn Lent MD      Address:          [facility]                                                             [place]                                                             [place], [place]  Referred By:      Clydene Pugh              Location:         [facility]  Ref. Address:     [place],                    [place] [address] ---------------------------------------------------------------------- Orders   #  Description                                 Code   1  [place] MFM OB FOLLOW UP                         [phone]   2  [place] MFM FETAL BPP WO NON STRESS              03009.23  ----------------------------------------------------------------------   #  Ordered By               Order #        Accession #    Episode #   1  JEFFREY Margurite Auerbach           [id number]  [id number]     [id number]   Greasy           [id number]      [id number]     [id number]  ---------------------------------------------------------------------- Indications    [redacted] weeks gestation of pregnancy                Z3A.31   Encounter for other antenatal screening        Z36.2   follow-up   Hypertension - Chronic/Pre-existing            O10.019   History of cesarean delivery, currently        O62.219   pregnant   Poor obstetric history: Previous preterm       O09.219   delivery, antepartum (@35wks )  ---------------------------------------------------------------------- OB History  Gravidity:    2         Prem:   1  Living:       1 ---------------------------------------------------------------------- Fetal Evaluation  Num Of Fetuses:     1  Fetal Heart         155  Rate(bpm):  Cardiac Activity:   Observed  Presentation:       Cephalic  Placenta:           Posterior, above cervical os  P. Cord Insertion:  Previously Visualized  Amniotic Fluid  AFI FV:      Subjectively within normal limits  AFI Sum(cm)     %Tile       Largest Pocket(cm)  12.37           34          5  RUQ(cm)       RLQ(cm)       LUQ(cm)        LLQ(cm)  1.98          5             3.91           1.48 ---------------------------------------------------------------------- Biophysical Evaluation  Amniotic F.V:   Pocket => 2 cm two         F. Tone:        Observed                  planes  F. Movement:    Observed                   Score:          8/8  F. Breathing:   Observed ---------------------------------------------------------------------- Biometry  BPD:      79.2  mm     G. Age:  31w 5d         38  %    CI:        75.66   %    70 - 86                                                          FL/HC:      19.9   %    19.1 - 21.3  HC:      288.7  mm     G. Age:  31w 5d         14  %    HC/AC:      1.10  0.96 - 1.17  AC:      262.5  mm     G. Age:  30w 2d         12  %    FL/BPD:     72.5   %    71 - 87  FL:       57.4  mm     G. Age:  30w 0d          5  %    FL/AC:      21.9   %    20 - 24  HUM:      51.2  mm     G. Age:  30w 0d         10  %  Est. FW:    1590  gm      3 lb 8 oz     30  %  ---------------------------------------------------------------------- Gestational Age  LMP:           36w 0d        Date:  02/06/17                 EDD:   11/13/17  U/S Today:     31w 0d                                        EDD:   12/18/17  Best:          31w 6d     Det. By:  U/S C R L  (06/10/17)    EDD:   12/12/17 ---------------------------------------------------------------------- Anatomy  Cranium:               Appears normal         Aortic Arch:            Previously seen  Cavum:                 Previously seen        Ductal Arch:            Previously seen  Ventricles:            Appears normal         Diaphragm:              Appears normal  Choroid Plexus:        Previously seen        Stomach:                Appears normal, left                                                                        sided  Cerebellum:            Previously seen        Abdomen:                Appears normal  Posterior Fossa:       Previously seen        Abdominal Wall:         Previously seen  Nuchal Fold:  Previously seen        Cord Vessels:           Previously seen  Face:                  Orbits and profile     Kidneys:                Appear normal                         previously seen  Lips:                  Previously seen        Bladder:                Appears normal  Thoracic:              Appears normal         Spine:                  Previously seen  Heart:                 Previously seen        Upper Extremities:      Previously seen  RVOT:                  Previously seen        Lower Extremities:      Previously seen  LVOT:                  Previously seen  Other:  Female gender previously seen. Heels and Left 5th digit previously          visualized. ---------------------------------------------------------------------- Cervix Uterus Adnexa  Cervix  Not visualized (advanced GA >29wks) ---------------------------------------------------------------------- Comments  Ms. Dollins's BPs today were  189/138, 167/133, and  186/133.  She is asymptomatic.  After discussing the situation  with Dr. Harolyn Rutherford we recommended that Ms. Racz go the  the MAU for further evaluation.  She agreed. ---------------------------------------------------------------------- Impression  Single living intrauterine pregnancy at 31 weeks 6 days.  Appropriate interval fetal growth (30%).  Normal amniotic fluid volume.  Normal interval fetal anatomy.  BPP 8.8. ---------------------------------------------------------------------- Recommendations  Recommend follow-up ultrasound examination in 4 weeks to  reassess fetal growth.  Recommend weekly fetal testing. ----------------------------------------------------------------------                 Jolyn Lent, MD Electronically Signed Final Report   10/16/2017 03:15 pm ----------------------------------------------------------------------   Current scheduled medications . betamethasone acetate-betamethasone sodium phosphate  12 mg Intramuscular Q24 Hr x 2  . hydrALAZINE  10 mg Oral Q8H  . labetalol  800 mg Oral TID  . loratadine  10 mg Oral Daily  . NIFEdipine  30 mg Oral BID  . potassium chloride  40 mEq Oral TID  . prenatal multivitamin  1 tablet Oral Q1200    I have reviewed the patient's current medications.  ASSESSMENT: Principal Problem:   Chronic hypertension with superimposed severe preeclampsia Active Problems:   History of cesarean section   History of preterm delivery   Hypokalemia   PLAN: Continue all three BP meds; already maxed out on Labetalol, can increase Nifedipine XR and Hydralazine as needed. Continue to correct hypokalemia; patient situation is concerning for hyperaldosteronism given difficult to control BP and hypokalemia. May benefit from evaluation postpartum and possibly Spironolactone for BP control at that point.  Continue magnesium sulfate  for eclampsia prophylaxis for now; consider discontinuing it when she is ? 48 hours from first  betamethasone administration. Second dose of betamethasone to be given today Neonatology consulted Patient has a history of cesarean section, adamantly desires repeat cesarean section if delivery is indicated. Declined TOLAC. Reassuring FHT, continue monitoring Continue routine antenatal care.   Verita Schneiders, MD, [place] for [employer], [place]

## 2017-10-17 NOTE — Progress Notes (Signed)
FETAL monitoring reveiwed, the patient has had No decels, however FHR has shown reduced beat to beat. This morning 10 x10 accels were noted, and recently beat to beat has been diminished.  Will keep on continuous monitoring. Additionally, Cr this a.m. Is 0.96, up from .7 yesterday. Will check Mg level in the a.m.

## 2017-10-18 LAB — COMPREHENSIVE METABOLIC PANEL
ALBUMIN: 2.6 g/dL — AB (ref 3.5–5.0)
ALK PHOS: 118 U/L (ref 38–126)
ALK PHOS: 128 U/L — AB (ref 38–126)
ALK PHOS: 121 U/L (ref 38–126)
ALT: 11 U/L — ABNORMAL LOW (ref 14–54)
ALT: 13 U/L — AB (ref 14–54)
ALT: 15 U/L (ref 14–54)
ANION GAP: 10 (ref 5–15)
ANION GAP: 11 (ref 5–15)
ANION GAP: 12 (ref 5–15)
AST: 20 U/L (ref 15–41)
AST: 26 U/L (ref 15–41)
AST: 29 U/L (ref 15–41)
Albumin: 2.5 g/dL — ABNORMAL LOW (ref 3.5–5.0)
Albumin: 2.6 g/dL — ABNORMAL LOW (ref 3.5–5.0)
BILIRUBIN TOTAL: 0.7 mg/dL (ref 0.3–1.2)
BILIRUBIN TOTAL: 0.7 mg/dL (ref 0.3–1.2)
BUN: 12 mg/dL (ref 6–20)
BUN: 12 mg/dL (ref 6–20)
BUN: 13 mg/dL (ref 6–20)
CALCIUM: 7.3 mg/dL — AB (ref 8.9–10.3)
CALCIUM: 7.2 mg/dL — AB (ref 8.9–10.3)
CALCIUM: 7.3 mg/dL — AB (ref 8.9–10.3)
CHLORIDE: 100 mmol/L — AB (ref 101–111)
CO2: 23 mmol/L (ref 22–32)
CO2: 21 mmol/L — ABNORMAL LOW (ref 22–32)
CO2: 22 mmol/L (ref 22–32)
CREATININE: 1.04 mg/dL — AB (ref 0.44–1.00)
Chloride: 99 mmol/L — ABNORMAL LOW (ref 101–111)
Chloride: 99 mmol/L — ABNORMAL LOW (ref 101–111)
Creatinine, Ser: 1.11 mg/dL — ABNORMAL HIGH (ref 0.44–1.00)
Creatinine, Ser: 1.15 mg/dL — ABNORMAL HIGH (ref 0.44–1.00)
GFR calc Af Amer: >60 mL/min (ref >60)
GFR calc non Af Amer: >60 mL/min (ref >60)
GFR calc non Af Amer: >60 mL/min (ref >60)
GLUCOSE: 113 mg/dL — AB (ref 65–99)
Glucose, Bld: 125 mg/dL — ABNORMAL HIGH (ref 65–99)
Glucose, Bld: 112 mg/dL — ABNORMAL HIGH (ref 65–99)
Potassium: 3.2 mmol/L — ABNORMAL LOW (ref 3.5–5.1)
Potassium: 2.8 mmol/L — ABNORMAL LOW (ref 3.5–5.1)
Potassium: 3.1 mmol/L — ABNORMAL LOW (ref 3.5–5.1)
SODIUM: 133 mmol/L — AB (ref 135–145)
Sodium: 131 mmol/L — ABNORMAL LOW (ref 135–145)
Sodium: 133 mmol/L — ABNORMAL LOW (ref 135–145)
TOTAL PROTEIN: 5.9 g/dL — AB (ref 6.5–8.1)
Total Bilirubin: 0.4 mg/dL (ref 0.3–1.2)
Total Protein: 6.3 g/dL — ABNORMAL LOW (ref 6.5–8.1)
Total Protein: 6.4 g/dL — ABNORMAL LOW (ref 6.5–8.1)

## 2017-10-18 LAB — CBC
HCT: 25.8 % — ABNORMAL LOW (ref 36.0–46.0)
HEMATOCRIT: 27.7 % — AB (ref 36.0–46.0)
HEMOGLOBIN: 8.3 g/dL — AB (ref 12.0–15.0)
HEMOGLOBIN: 8.9 g/dL — AB (ref 12.0–15.0)
MCH: 24.1 pg — AB (ref 26.0–34.0)
MCH: 24.1 pg — ABNORMAL LOW (ref 26.0–34.0)
MCHC: 32.2 g/dL (ref 30.0–36.0)
MCHC: 32.1 g/dL (ref 30.0–36.0)
MCV: 75 fL — ABNORMAL LOW (ref 78.0–100.0)
MCV: 74.9 fL — ABNORMAL LOW (ref 78.0–100.0)
Platelets: 206 10*3/uL (ref 150–400)
Platelets: 213 10*3/uL (ref 150–400)
RBC: 3.44 MIL/uL — AB (ref 3.87–5.11)
RBC: 3.7 MIL/uL — ABNORMAL LOW (ref 3.87–5.11)
RDW: 17.2 % — ABNORMAL HIGH (ref 11.5–15.5)
RDW: 17.3 % — ABNORMAL HIGH (ref 11.5–15.5)
WBC: 7.4 10*3/uL (ref 4.0–10.5)
WBC: 8.5 10*3/uL (ref 4.0–10.5)

## 2017-10-18 LAB — MAGNESIUM
MAGNESIUM: 9 mg/dL — AB (ref 1.7–2.4)
Magnesium: 6.1 mg/dL (ref 1.7–2.4)
Magnesium: 5.3 mg/dL — ABNORMAL HIGH (ref 1.7–2.4)

## 2017-10-18 NOTE — Progress Notes (Signed)
CRITICAL VALUE ALERT  Critical Value:  Mag- 6.1  Date & Time Notied:  10/18/17  Provider Notified: Dr. Macon LargeAnyanwu  Orders Received/Actions taken: Repeat labs and advance to regular diet.

## 2017-10-18 NOTE — Progress Notes (Signed)
Called Dr. Vergie LivingPickens following a night of minimal to absent variability throughout the night. Patients normal since being on Magnesium. D/c Mag and will recheck levels in four hours.  Baby is having a couple 10x10's. Will continue to monitor baby.

## 2017-10-18 NOTE — Progress Notes (Signed)
CRITICAL VALUE ALERT  Critical Value:  Mag 9.0  Date & Time Notied:  10/18/17  Provider Notified: Dr. Vergie LivingPickens  Orders Received/Actions taken: D/C mag, repeat labs in 4 hours

## 2017-10-18 NOTE — Progress Notes (Signed)
[facility] COMPREHENSIVE PROGRESS NOTE  Eileen Miller is a 22 y.o. G2P0101 at 68w5dwho [facility]  Estimated Date of Delivery: 12/15/17 Fetal presentation is cephalic.  Length of Stay:  2 Days. Admitted 10/16/2017  Subjective: Denies any headaches, visual changes, RUQ/epigastric pain. Patient did have increased Cr level noted this morning to 1.1, Mag level was 9.0,  magnesium sulfate was discontinued.   Patient reports good fetal movement.  She reports no uterine contractions, no bleeding and no loss of fluid per vagina.  Vitals:  Blood pressure (!) 144/91, pulse 78, temperature 98 F (36.7 C), temperature source Oral, resp. rate 17, height _0  (1.702 m), weight 172 lb 12 oz (78.4 kg), last menstrual period 02/06/2017, SpO2 97 %, unknown if currently breastfeeding.  Patient Vitals for the past 24 hrs:  BP Temp Temp src Pulse Resp SpO2  10/18/17 0737 (!) 144/91 98 F (36.7 C) Oral 78 17 97 %  10/18/17 0400 (!) 140/92 97.8 F (36.6 C) Oral 79 16 100 %  10/17/17 2355 135/85 (!) 97.3 F (36.3 C) Oral 77 18 100 %  10/17/17 2213 (!) 148/89 - - 79 18 -  10/17/17 1954 135/84 - - 80 16 100 %  10/17/17 1542 (!) 145/94 97.6 F (36.4 C) Oral 81 18 100 %  10/17/17 1248 (!) 153/98 (!) 97.2 F (36.2 C) Oral 85 18 99 %   Physical Examination: CONSTITUTIONAL: Well-developed, well-nourished female in no acute distress.  HENT:  Normocephalic, atraumatic, External right and left ear normal. Oropharynx is clear and moist EYES: Conjunctivae and EOM are normal. Pupils are equal, round, and reactive to light. No scleral icterus.  NECK: Normal range of motion, supple, no masses SKIN: Skin is warm and dry. No rash noted. Not diaphoretic. No erythema. No pallor. [place] Alert and oriented to person, place, and time. Normal reflexes, muscle tone coordination. No cranial nerve deficit noted. PSYCHIATRIC: Normal mood and affect. Normal  behavior. Normal judgment and thought content. CARDIOVASCULAR: Normal heart rate noted, regular rhythm RESPIRATORY: Effort and breath sounds normal, no problems with respiration noted MUSCULOSKELETAL: Normal range of motion. No edema and no tenderness. 2+ distal pulses. ABDOMEN: Soft, nontender, nondistended, gravid. CERVIX:  Deferred  Fetal monitoring: FHR: 130 bpm, Variability: moderate, no accelerations and no decelerations: Uterine activity: No contractions  CMP Latest Ref Rng & Units 10/18/2017 10/17/2017 10/16/2017  Glucose 65 - 99 mg/dL 125(H) 103(H) 75  BUN 6 - 20 mg/dL _1 Creatinine 0.44 - 1.00 mg/dL 1.11(H) 0.96 0.79  Sodium 135 - 145 mmol/L 133(L) 131(L) 133(L)  Potassium 3.5 - 5.1 mmol/L 3.2(L) 2.6(LL) 2.7(LL)  Chloride 101 - 111 mmol/L 100(L) 98(L) 100(L)  CO2 22 - 32 mmol/L 23 20(L) 23  Calcium 8.9 - 10.3 mg/dL 7.3(L) 7.9(L) 8.8(L)  Total Protein 6.5 - 8.1 g/dL 6.3(L) 6.7 6.6  Total Bilirubin 0.3 - 1.2 mg/dL 0.4 0.9 0.8  Alkaline Phos 38 - 126 U/L 118 141(H) 151(H)  AST 15 - 41 U/L 20 10(L) 23  ALT 14 - 54 U/L 11(L) 10(L) 11(L)    Results for orders placed or performed during the hospital encounter of 10/16/17 (from the past 48 hour(s))  Protein / creatinine ratio, urine     Status: Abnormal   Collection Time: 10/16/17  3:30 PM  Result Value Ref Range   Creatinine, Urine 289.00 mg/dL   Total Protein, Urine 662 mg/dL    Comment: NO NORMAL RANGE ESTABLISHED FOR THIS  TEST RESULTS CONFIRMED BY MANUAL DILUTION    Protein Creatinine Ratio 2.29 (H) 0.00 - 0.15 mg/mg[Cre]    Comment: Performed at [facility], [address] [place], [place] [address]  Urinalysis, Routine w reflex microscopic     Status: Abnormal   Collection Time: 10/16/17  3:30 PM  Result Value Ref Range   Color, Urine AMBER (A) YELLOW    Comment: BIOCHEMICALS MAY BE AFFECTED BY COLOR   APPearance HAZY (A) CLEAR   Specific Gravity, Urine 1.020 1.005 - 1.030   pH 7.0 5.0 - 8.0   Glucose, UA  50 (A) NEGATIVE mg/dL   Hgb urine dipstick NEGATIVE NEGATIVE   Bilirubin Urine NEGATIVE NEGATIVE   Ketones, ur NEGATIVE NEGATIVE mg/dL   Protein, ur 100 (A) NEGATIVE mg/dL   Nitrite NEGATIVE NEGATIVE   Leukocytes, UA NEGATIVE NEGATIVE   RBC / HPF 0-5 0 - 5 RBC/hpf   WBC, UA 6-30 0 - 5 WBC/hpf   Bacteria, UA RARE (A) NONE SEEN   Squamous Epithelial / LPF 6-30 (A) NONE SEEN   Mucus PRESENT     Comment: Performed at [facility], [address] [place], [place] [address]  Comprehensive metabolic panel     Status: Abnormal   Collection Time: 10/16/17  3:45 PM  Result Value Ref Range   Sodium 133 (L) 135 - 145 mmol/L   Potassium 2.7 (LL) 3.5 - 5.1 mmol/L    Comment: CRITICAL RESULT CALLED TO, READ BACK BY AND VERIFIED WITH: LOWE,J. @ 1618 ON 03.13.19 BY GROOMSC    Chloride 100 (L) 101 - 111 mmol/L   CO2 23 22 - 32 mmol/L   Glucose, Bld 75 65 - 99 mg/dL   BUN 9 6 - 20 mg/dL   Creatinine, Ser 0.79 0.44 - 1.00 mg/dL   Calcium 8.8 (L) 8.9 - 10.3 mg/dL   Total Protein 6.6 6.5 - 8.1 g/dL   Albumin 2.8 (L) 3.5 - 5.0 g/dL   AST 23 15 - 41 U/L   ALT 11 (L) 14 - 54 U/L   Alkaline Phosphatase 151 (H) 38 - 126 U/L   Total Bilirubin 0.8 0.3 - 1.2 mg/dL   GFR calc non Af Amer >60 >60 mL/min   GFR calc Af Amer >60 >60 mL/min    Comment: (NOTE) The eGFR has been calculated using the CKD EPI equation. This calculation has not been validated in all clinical situations. eGFR's persistently <60 mL/min signify possible Chronic Kidney Disease.    Anion gap 10 5 - 15    Comment: Performed at [facility], [address] [place], [place] [address]  CBC     Status: Abnormal   Collection Time: 10/16/17  3:45 PM  Result Value Ref Range   WBC 4.0 4.0 - 10.5 K/uL   RBC 3.76 (L) 3.87 - 5.11 MIL/uL   Hemoglobin 9.0 (L) 12.0 - 15.0 g/dL   HCT 28.2 (L) 36.0 - 46.0 %   MCV 75.0 (L) 78.0 - 100.0 fL   MCH 23.9 (L) 26.0 - 34.0 pg   MCHC 31.9 30.0 - 36.0 g/dL   RDW 16.4 (H) 11.5 - 15.5 %    Platelets 171 150 - 400 K/uL    Comment: Performed at [facility], [address] [place], [place] [address]  Type and screen [place]     Status: None   Collection Time: 10/16/17  3:45 PM  Result Value Ref Range   ABO/RH(D) O POS    Antibody Screen  NEG    Sample Expiration      10/19/2017 Performed at [facility], [address], [place], [place] [address]   CBC     Status: Abnormal   Collection Time: 10/17/17  5:21 AM  Result Value Ref Range   WBC 5.8 4.0 - 10.5 K/uL   RBC 3.78 (L) 3.87 - 5.11 MIL/uL   Hemoglobin 9.1 (L) 12.0 - 15.0 g/dL   HCT 28.3 (L) 36.0 - 46.0 %   MCV 74.9 (L) 78.0 - 100.0 fL   MCH 24.1 (L) 26.0 - 34.0 pg   MCHC 32.2 30.0 - 36.0 g/dL   RDW 16.8 (H) 11.5 - 15.5 %   Platelets 184 150 - 400 K/uL    Comment: Performed at [facility], [address] [place], [place] [address]  Comprehensive metabolic panel     Status: Abnormal   Collection Time: 10/17/17  5:21 AM  Result Value Ref Range   Sodium 131 (L) 135 - 145 mmol/L   Potassium 2.6 (LL) 3.5 - 5.1 mmol/L    Comment: CRITICAL RESULT CALLED TO, READ BACK BY AND VERIFIED WITH: RODRIGUEZ,A _0  ON [id number] BY FLEMINGS    Chloride 98 (L) 101 - 111 mmol/L   CO2 20 (L) 22 - 32 mmol/L   Glucose, Bld 103 (H) 65 - 99 mg/dL   BUN 11 6 - 20 mg/dL   Creatinine, Ser 0.96 0.44 - 1.00 mg/dL   Calcium 7.9 (L) 8.9 - 10.3 mg/dL   Total Protein 6.7 6.5 - 8.1 g/dL   Albumin 2.7 (L) 3.5 - 5.0 g/dL   AST 10 (L) 15 - 41 U/L   ALT 10 (L) 14 - 54 U/L   Alkaline Phosphatase 141 (H) 38 - 126 U/L   Total Bilirubin 0.9 0.3 - 1.2 mg/dL   GFR calc non Af Amer >60 >60 mL/min   GFR calc Af Amer >60 >60 mL/min    Comment: (NOTE) The eGFR has been calculated using the CKD EPI equation. This calculation has not been validated in all clinical situations. eGFR's persistently <60 mL/min signify possible Chronic Kidney Disease.    Anion gap 13 5 - 15    Comment: Performed at [facility],  [address] [place], [place] [address]  Comprehensive metabolic panel     Status: Abnormal   Collection Time: 10/18/17  5:55 AM  Result Value Ref Range   Sodium 133 (L) 135 - 145 mmol/L   Potassium 3.2 (L) 3.5 - 5.1 mmol/L    Comment: DELTA CHECK NOTED REPEATED TO VERIFY SLIGHT HEMOLYSIS    Chloride 100 (L) 101 - 111 mmol/L   CO2 23 22 - 32 mmol/L   Glucose, Bld 125 (H) 65 - 99 mg/dL   BUN 12 6 - 20 mg/dL   Creatinine, Ser 1.11 (H) 0.44 - 1.00 mg/dL   Calcium 7.3 (L) 8.9 - 10.3 mg/dL   Total Protein 6.3 (L) 6.5 - 8.1 g/dL   Albumin 2.5 (L) 3.5 - 5.0 g/dL   AST 20 15 - 41 U/L   ALT 11 (L) 14 - 54 U/L   Alkaline Phosphatase 118 38 - 126 U/L   Total Bilirubin 0.4 0.3 - 1.2 mg/dL   GFR calc non Af Amer >60 >60 mL/min   GFR calc Af Amer >60 >60 mL/min    Comment: (NOTE) The eGFR has been calculated using the CKD EPI equation. This calculation has not been validated in all clinical situations. eGFR's persistently <60 mL/min signify possible Chronic  Kidney Disease.    Anion gap 10 5 - 15    Comment: Performed at [facility], [address], [place], [place] [address]  CBC     Status: Abnormal   Collection Time: 10/18/17  5:55 AM  Result Value Ref Range   WBC 7.4 4.0 - 10.5 K/uL   RBC 3.44 (L) 3.87 - 5.11 MIL/uL   Hemoglobin 8.3 (L) 12.0 - 15.0 g/dL   HCT 25.8 (L) 36.0 - 46.0 %   MCV 75.0 (L) 78.0 - 100.0 fL   MCH 24.1 (L) 26.0 - 34.0 pg   MCHC 32.2 30.0 - 36.0 g/dL   RDW 17.2 (H) 11.5 - 15.5 %   Platelets 206 150 - 400 K/uL    Comment: Performed at [facility], [address], [place], [place] [address]  Magnesium     Status: Abnormal   Collection Time: 10/18/17  5:55 AM  Result Value Ref Range   Magnesium 9.0 (HH) 1.7 - 2.4 mg/dL    Comment: RESULTS CONFIRMED BY MANUAL DILUTION CRITICAL RESULT CALLED TO, READ BACK BY AND VERIFIED WITH: KEY, H. _0  ON 10/18/2017 BY BOVELL,A. Performed at [facility], [address] [place], [place] [address]      [place] Mfm Fetal Bpp Wo Non Stress  Result Date: 10/16/2017 ----------------------------------------------------------------------  OBSTETRICS REPORT                      (Signed Final 10/16/2017 03:15 pm) ---------------------------------------------------------------------- Patient Info  ID #:       [id number]                          D.O.B.:  Feb 07, 1996 (21 yrs)  Name:       Eileen Miller                 Visit Date: 10/16/2017 02:15 pm ---------------------------------------------------------------------- Performed By  Performed By:     Melba Coon Phy.:   [place] for                                                             [place]  Attending:        Jolyn Lent MD      Address:          [facility]  OB/Gyn [facility]                                                             [place], [place]  Referred By:      Clydene Pugh              Location:         [facility]  Ref. Address:     [place],                    [place] [address] ---------------------------------------------------------------------- Orders   #  Description                                 Code   1  [place] MFM OB FOLLOW UP                         [id number]   2  [place] MFM FETAL BPP WO NON STRESS              76819.01  ----------------------------------------------------------------------   #  Ordered By               Order #        Accession #    Episode #   1  JEFFREY Margurite Auerbach           [id number]      [id number]     [id number]   2   JEFFREY DENNEY           [id number]      [id number]     [id number]  ---------------------------------------------------------------------- Indications   [redacted] weeks gestation of pregnancy                Z3A.31   Encounter for other antenatal screening        Z36.2   follow-up   Hypertension - Chronic/Pre-existing  O10.019   History of cesarean delivery, currently        O67.219   pregnant   Poor obstetric history: Previous preterm       O09.219   delivery, antepartum (_0 )  ---------------------------------------------------------------------- OB History  Gravidity:    2         Prem:   1  Living:       1 ---------------------------------------------------------------------- Fetal Evaluation  Num Of Fetuses:     1  Fetal Heart         155  Rate(bpm):  Cardiac Activity:   Observed  Presentation:       Cephalic  Placenta:           Posterior, above cervical os  P. Cord Insertion:  Previously Visualized  Amniotic Fluid  AFI FV:      Subjectively within normal limits  AFI Sum(cm)     %Tile       Largest Pocket(cm)  12.37           34          5  RUQ(cm)       RLQ(cm)       LUQ(cm)        LLQ(cm)  1.98          5             3.91           1.48 ---------------------------------------------------------------------- Biophysical Evaluation  Amniotic F.V:   Pocket => 2 cm two         F. Tone:        Observed                  planes  F. Movement:    Observed                   Score:          8/8  F. Breathing:   Observed ---------------------------------------------------------------------- Biometry  BPD:      79.2  mm     G. Age:  31w 5d         38  %    CI:        75.66   %    70 - 86                                                          FL/HC:      19.9   %    19.1 - 21.3  HC:      288.7  mm     G. Age:  31w 5d         14  %    HC/AC:      1.10        0.96 - 1.17  AC:      262.5  mm     G. Age:  30w 2d         12  %    FL/BPD:     72.5   %    71 - 87  FL:       57.4  mm     G. Age:  30w 0d          5  %     FL/AC:  21.9   %    20 - 24  HUM:      51.2  mm     G. Age:  30w 0d         10  %  Est. FW:    1590  gm      3 lb 8 oz     30  % ---------------------------------------------------------------------- Gestational Age  LMP:           36w 0d        Date:  02/06/17                 EDD:   11/13/17  U/S Today:     31w 0d                                        EDD:   12/18/17  Best:          31w 6d     Det. By:  U/S C R L  (06/10/17)    EDD:   12/12/17 ---------------------------------------------------------------------- Anatomy  Cranium:               Appears normal         Aortic Arch:            Previously seen  Cavum:                 Previously seen        Ductal Arch:            Previously seen  Ventricles:            Appears normal         Diaphragm:              Appears normal  Choroid Plexus:        Previously seen        Stomach:                Appears normal, left                                                                        sided  Cerebellum:            Previously seen        Abdomen:                Appears normal  Posterior Fossa:       Previously seen        Abdominal Wall:         Previously seen  Nuchal Fold:           Previously seen        Cord Vessels:           Previously seen  Face:                  Orbits and profile     Kidneys:                Appear normal  previously seen  Lips:                  Previously seen        Bladder:                Appears normal  Thoracic:              Appears normal         Spine:                  Previously seen  Heart:                 Previously seen        Upper Extremities:      Previously seen  RVOT:                  Previously seen        Lower Extremities:      Previously seen  LVOT:                  Previously seen  Other:  Female gender previously seen. Heels and Left 5th digit previously          visualized. ---------------------------------------------------------------------- Cervix Uterus Adnexa  Cervix  Not visualized  (advanced GA >29wks) ---------------------------------------------------------------------- Comments  Ms. Cayer's BPs today were 189/138, 167/133, and  186/133.  She is asymptomatic.  After discussing the situation  with Dr. Harolyn Rutherford we recommended that Ms. Fulgham go the  the MAU for further evaluation.  She agreed. ---------------------------------------------------------------------- Impression  Single living intrauterine pregnancy at 31 weeks 6 days.  Appropriate interval fetal growth (30%).  Normal amniotic fluid volume.  Normal interval fetal anatomy.  BPP 8.8. ---------------------------------------------------------------------- Recommendations  Recommend follow-up ultrasound examination in 4 weeks to  reassess fetal growth.  Recommend weekly fetal testing. ----------------------------------------------------------------------                 Jolyn Lent, MD Electronically Signed Final Report   10/16/2017 03:15 pm ----------------------------------------------------------------------  [place] Mfm Ob Follow Up  Result Date: 10/16/2017 ----------------------------------------------------------------------  OBSTETRICS REPORT                      (Signed Final 10/16/2017 03:15 pm) ---------------------------------------------------------------------- Patient Info  ID #:       [id number]                          D.O.B.:  11-Oct-1995 (21 yrs)  Name:       Eileen Miller                 Visit Date: 10/16/2017 02:15 pm ---------------------------------------------------------------------- Performed By  Performed By:     Melba Coon Phy.:   [facility] [facility]  Attending:        Jolyn Lent MD      Address:          [facility]                                                             [place]                                                             [place], [place]  Referred By:      Clydene Pugh              Location:         [facility]  Ref. Address:     [place],                    [place] [address] ---------------------------------------------------------------------- Orders   #  Description                                 Code   1  [place] MFM OB FOLLOW UP                         11914.78   2  [place] MFM FETAL BPP WO NON STRESS  93903.00  ----------------------------------------------------------------------   #  Ordered By               Order #        Accession #    Episode #   [place]           [id number]      [id number]     [id number]   2  JEFFREY DENNEY           [id number]      [id number]     [id number]  ---------------------------------------------------------------------- Indications   [redacted] weeks gestation of pregnancy                Z3A.31   Encounter for other antenatal screening        Z36.2   follow-up   Hypertension - Chronic/Pre-existing            O10.019   History of cesarean delivery, currently        O20.219   pregnant   Poor obstetric history: Previous preterm       O09.219   delivery, antepartum (_0 )  ---------------------------------------------------------------------- OB History  Gravidity:    2         Prem:   1  Living:       1 ---------------------------------------------------------------------- Fetal Evaluation  Num Of Fetuses:     1  Fetal Heart         155  Rate(bpm):  Cardiac Activity:   Observed  Presentation:       Cephalic  Placenta:           Posterior, above cervical os  P. Cord Insertion:  Previously Visualized  Amniotic Fluid  AFI FV:       Subjectively within normal limits  AFI Sum(cm)     %Tile       Largest Pocket(cm)  12.37           34          5  RUQ(cm)       RLQ(cm)       LUQ(cm)        LLQ(cm)  1.98          5             3.91           1.48 ---------------------------------------------------------------------- Biophysical Evaluation  Amniotic F.V:   Pocket => 2 cm two         F. Tone:        Observed                  planes  F. Movement:    Observed                   Score:          8/8  F. Breathing:   Observed ---------------------------------------------------------------------- Biometry  BPD:      79.2  mm     G. Age:  31w 5d         38  %    CI:        75.66   %    70 - 86  FL/HC:      19.9   %    19.1 - 21.3  HC:      288.7  mm     G. Age:  31w 5d         14  %    HC/AC:      1.10        0.96 - 1.17  AC:      262.5  mm     G. Age:  30w 2d         12  %    FL/BPD:     72.5   %    71 - 87  FL:       57.4  mm     G. Age:  30w 0d          5  %    FL/AC:      21.9   %    20 - 24  HUM:      51.2  mm     G. Age:  30w 0d         10  %  Est. FW:    1590  gm      3 lb 8 oz     30  % ---------------------------------------------------------------------- Gestational Age  LMP:           36w 0d        Date:  02/06/17                 EDD:   11/13/17  U/S Today:     31w 0d                                        EDD:   12/18/17  Best:          31w 6d     Det. By:  U/S C R L  (06/10/17)    EDD:   12/12/17 ---------------------------------------------------------------------- Anatomy  Cranium:               Appears normal         Aortic Arch:            Previously seen  Cavum:                 Previously seen        Ductal Arch:            Previously seen  Ventricles:            Appears normal         Diaphragm:              Appears normal  Choroid Plexus:        Previously seen        Stomach:                Appears normal, left                                                                         sided  Cerebellum:  Previously seen        Abdomen:                Appears normal  Posterior Fossa:       Previously seen        Abdominal Wall:         Previously seen  Nuchal Fold:           Previously seen        Cord Vessels:           Previously seen  Face:                  Orbits and profile     Kidneys:                Appear normal                         previously seen  Lips:                  Previously seen        Bladder:                Appears normal  Thoracic:              Appears normal         Spine:                  Previously seen  Heart:                 Previously seen        Upper Extremities:      Previously seen  RVOT:                  Previously seen        Lower Extremities:      Previously seen  LVOT:                  Previously seen  Other:  Female gender previously seen. Heels and Left 5th digit previously          visualized. ---------------------------------------------------------------------- Cervix Uterus Adnexa  Cervix  Not visualized (advanced GA >29wks) ---------------------------------------------------------------------- Comments  Ms. Muradyan's BPs today were 189/138, 167/133, and  186/133.  She is asymptomatic.  After discussing the situation  with Dr. Harolyn Rutherford we recommended that Ms. Macchia go the  the MAU for further evaluation.  She agreed. ---------------------------------------------------------------------- Impression  Single living intrauterine pregnancy at 31 weeks 6 days.  Appropriate interval fetal growth (30%).  Normal amniotic fluid volume.  Normal interval fetal anatomy.  BPP 8.8. ---------------------------------------------------------------------- Recommendations  Recommend follow-up ultrasound examination in 4 weeks to  reassess fetal growth.  Recommend weekly fetal testing. ----------------------------------------------------------------------                 Jolyn Lent, MD Electronically Signed Final Report   10/16/2017 03:15 pm  ----------------------------------------------------------------------   Current scheduled medications . hydrALAZINE  10 mg Oral Q8H  . labetalol  800 mg Oral TID  . loratadine  10 mg Oral Daily  . NIFEdipine  30 mg Oral BID  . potassium chloride  40 mEq Oral TID  . prenatal multivitamin  1 tablet Oral Q1200    I have reviewed the patient's current medications.  ASSESSMENT: Principal Problem:   Chronic hypertension with superimposed severe preeclampsia Active Problems:   History of cesarean section   History of preterm delivery   Hypokalemia  PLAN: Concerned about increased Cr. Will recheck ~1100, if ? 1.2 mg/dl, patient will need delivery.  Patient has a history of cesarean section, adamantly desires repeat cesarean section if delivery is indicated. Declined TOLAC. Will keep NPO for now. She is s/p betamethasone regimen.  Appreciate consultation by Neonatology (Dr. Tamala Julian) Continue all three BP meds; already maxed out on Labetalol, can increase Nifedipine XR and Hydralazine as needed. FHT with decreased variability after magnesium sulfate, will continue monitoring Continue close observation   Verita Schneiders, MD, [place], [facility] for [employer], [place]

## 2017-10-18 NOTE — Consult Note (Signed)
[facility]  [facility] 10/18/2017    9:57 AM  Neonatal Medicine Consultation         Eileen Miller          MRN:  [id number]  I was called at the request of the patient's obstetrician (Dr. Emelda FearFerguson) to speak to this patient due to potential premature delivery at 32+ weeks.  The patient's prenatal course includes chronic hypertension (severe).  She is 32 weeks currently.  She is admitted to 3rd floor unit, and is receiving treatment that includes betamethasone (given 3/13 and 3/14), three antihypertensive medications, and magnesium sulfate (stopped this morning).  The baby is a female.  I reviewed expectations for a baby born at 32 weeks and beyond, including survival, length of stay, issues such as respiratory distress and feeding.  We would expect the baby to remain the NICU for about a month, probably discharged before her due date.  I spent 20 minutes reviewing the record, speaking to the patient, and entering appropriate documentation.  More than 50% of the time was spent face to face with patient.   _____________________ Electronically Signed By: Angelita InglesMcCrae S. Smith, MD Neonatologist

## 2017-10-19 LAB — COMPREHENSIVE METABOLIC PANEL
ALBUMIN: 2.6 g/dL — AB (ref 3.5–5.0)
ALT: 18 U/L (ref 14–54)
ANION GAP: 9 (ref 5–15)
AST: 36 U/L (ref 15–41)
Alkaline Phosphatase: 127 U/L — ABNORMAL HIGH (ref 38–126)
BUN: 18 mg/dL (ref 6–20)
CO2: 23 mmol/L (ref 22–32)
Calcium: 7.6 mg/dL — ABNORMAL LOW (ref 8.9–10.3)
Chloride: 103 mmol/L (ref 101–111)
Creatinine, Ser: 1.21 mg/dL — ABNORMAL HIGH (ref 0.44–1.00)
GFR calc non Af Amer: >60 mL/min (ref >60)
Glucose, Bld: 86 mg/dL (ref 65–99)
POTASSIUM: 3.1 mmol/L — AB (ref 3.5–5.1)
SODIUM: 135 mmol/L (ref 135–145)
TOTAL PROTEIN: 6.5 g/dL (ref 6.5–8.1)
Total Bilirubin: 0.5 mg/dL (ref 0.3–1.2)

## 2017-10-19 LAB — CBC WITH DIFFERENTIAL/PLATELET
BASOS PCT: 0 %
Basophils Absolute: 0 10*3/uL (ref 0.0–0.1)
EOS ABS: 0 10*3/uL (ref 0.0–0.7)
EOS PCT: 0 %
HCT: 27.7 % — ABNORMAL LOW (ref 36.0–46.0)
Hemoglobin: 8.8 g/dL — ABNORMAL LOW (ref 12.0–15.0)
LYMPHS ABS: 1.6 10*3/uL (ref 0.7–4.0)
Lymphocytes Relative: 20 %
MCH: 24 pg — AB (ref 26.0–34.0)
MCHC: 31.8 g/dL (ref 30.0–36.0)
MCV: 75.7 fL — ABNORMAL LOW (ref 78.0–100.0)
MONO ABS: 0.3 10*3/uL (ref 0.1–1.0)
MONOS PCT: 3 %
Neutro Abs: 5.9 10*3/uL (ref 1.7–7.7)
Neutrophils Relative %: 77 %
Platelets: 229 10*3/uL (ref 150–400)
RBC: 3.66 MIL/uL — ABNORMAL LOW (ref 3.87–5.11)
RDW: 17.4 % — AB (ref 11.5–15.5)
WBC: 7.8 10*3/uL (ref 4.0–10.5)

## 2017-10-19 MED ORDER — HYDRALAZINE HCL 20 MG/ML IJ SOLN
10.0000 mg | Freq: Once | INTRAMUSCULAR | Status: AC | PRN
  Administered 2017-10-19: 10 mg via INTRAVENOUS
  Filled 2017-10-19: qty 1

## 2017-10-19 MED ORDER — LABETALOL HCL 5 MG/ML IV SOLN
20.0000 mg | INTRAVENOUS | Status: AC | PRN
  Administered 2017-10-19: 80 mg via INTRAVENOUS
  Administered 2017-10-19: 40 mg via INTRAVENOUS
  Administered 2017-10-19: 20 mg via INTRAVENOUS
  Filled 2017-10-19: qty 8
  Filled 2017-10-19: qty 16

## 2017-10-19 MED ORDER — LABETALOL HCL 5 MG/ML IV SOLN
INTRAVENOUS | Status: AC
  Administered 2017-10-19: 20 mg via INTRAVENOUS
  Filled 2017-10-19: qty 4

## 2017-10-19 MED ORDER — POTASSIUM CHLORIDE 10 MEQ/100ML IV SOLN
10.0000 meq | INTRAVENOUS | Status: AC
  Administered 2017-10-19 (×4): 10 meq via INTRAVENOUS
  Filled 2017-10-19 (×4): qty 100

## 2017-10-19 NOTE — Progress Notes (Signed)
Patient ID: Eileen Miller, female   DOB: 08/21/1995, 22 y.o.   MRN: [id number] FACULTY PRACTICE ANTEPARTUM(COMPREHENSIVE) NOTE  Eileen Lifeatianna Ida is a 22 y.o. [id number] with Estimated Date of Delivery: 12/15/17   [id number]  who is admitted for severe pre eclampsia superimposed on CHTN with acute renal insufficiency.    Fetal presentation is cephalic. Length of Stay:  3  Days  Date of admission:10/16/2017  Subjective: no headache or visual symptoms Patient reports the fetal movement as active. Patient reports uterine contraction  activity as none. Patient reports  vaginal bleeding as none. Patient describes fluid per vagina as None.  Vitals:  Blood pressure (!) 143/99, pulse 74, temperature 97.8 F (36.6 C), temperature source Oral, resp. rate 18, height 5\' 7"  (1.702 m), weight 186 lb 4 oz (84.5 kg), last menstrual period 02/06/2017, SpO2 100 %, unknown if currently breastfeeding. Vitals:   10/18/17 2141 10/18/17 2215 10/19/17 0039 10/19/17 0459  BP: (!) 136/95  (!) 140/96 (!) 143/99  Pulse: 74  75 74  Resp: 18  18 18   Temp:   97.8 F (36.6 C) 97.8 F (36.6 C)  TempSrc:   Oral Oral  SpO2: 100%  98% 100%  Weight:  186 lb 4 oz (84.5 kg)    Height:       Physical Examination:  General appearance - alert, well appearing, and in no distress Abdomen soft non tender gravid Fundal Height:  size equals dates Pelvic Exam:  deferred  Extremities: extremities normal, atraumatic, no cyanosis or edema with DTRs 2+ bilaterally Membranes:intact  Fetal Monitoring:  Baseline: 135 bpm, Variability: Good {> 6 bpm) and Accelerations: Reactive     Labs:  Results for orders placed or performed during the hospital encounter of 10/16/17 (from the past 24 hour(s))  CBC   Collection Time: 10/18/17 11:02 AM  Result Value Ref Range   WBC 8.5 4.0 - 10.5 K/uL   RBC 3.70 (L) 3.87 - 5.11 MIL/uL   Hemoglobin 8.9 (L) 12.0 - 15.0 g/dL   HCT 40.927.7 (L) 81.136.0 - 91.446.0 %   MCV 74.9 (L) 78.0 - 100.0 fL   MCH 24.1 (L) 26.0  - 34.0 pg   MCHC 32.1 30.0 - 36.0 g/dL   RDW 78.217.3 (H) 95.611.5 - 21.315.5 %   Platelets 213 150 - 400 K/uL  Comprehensive metabolic panel   Collection Time: 10/18/17 11:02 AM  Result Value Ref Range   Sodium 131 (L) 135 - 145 mmol/L   Potassium 2.8 (L) 3.5 - 5.1 mmol/L   Chloride 99 (L) 101 - 111 mmol/L   CO2 21 (L) 22 - 32 mmol/L   Glucose, Bld 113 (H) 65 - 99 mg/dL   BUN 12 6 - 20 mg/dL   Creatinine, Ser 0.861.04 (H) 0.44 - 1.00 mg/dL   Calcium 7.2 (L) 8.9 - 10.3 mg/dL   Total Protein 6.4 (L) 6.5 - 8.1 g/dL   Albumin 2.6 (L) 3.5 - 5.0 g/dL   AST 26 15 - 41 U/L   ALT 13 (L) 14 - 54 U/L   Alkaline Phosphatase 128 (H) 38 - 126 U/L   Total Bilirubin 0.7 0.3 - 1.2 mg/dL   GFR calc non Af Amer >60 >60 mL/min   GFR calc Af Amer >60 >60 mL/min   Anion gap 11 5 - 15  Magnesium   Collection Time: 10/18/17 11:02 AM  Result Value Ref Range   Magnesium 6.1 (HH) 1.7 - 2.4 mg/dL  Magnesium   Collection Time: 10/18/17  3:56 PM  Result Value Ref Range   Magnesium 5.3 (H) 1.7 - 2.4 mg/dL  Comprehensive metabolic panel   Collection Time: 10/18/17  3:56 PM  Result Value Ref Range   Sodium 133 (L) 135 - 145 mmol/L   Potassium 3.1 (L) 3.5 - 5.1 mmol/L   Chloride 99 (L) 101 - 111 mmol/L   CO2 22 22 - 32 mmol/L   Glucose, Bld 112 (H) 65 - 99 mg/dL   BUN 13 6 - 20 mg/dL   Creatinine, Ser 1.61 (H) 0.44 - 1.00 mg/dL   Calcium 7.3 (L) 8.9 - 10.3 mg/dL   Total Protein 5.9 (L) 6.5 - 8.1 g/dL   Albumin 2.6 (L) 3.5 - 5.0 g/dL   AST 29 15 - 41 U/L   ALT 15 14 - 54 U/L   Alkaline Phosphatase 121 38 - 126 U/L   Total Bilirubin 0.7 0.3 - 1.2 mg/dL   GFR calc non Af Amer >60 >60 mL/min   GFR calc Af Amer >60 >60 mL/min   Anion gap 12 5 - 15  CBC with Differential/Platelet   Collection Time: 10/19/17  5:33 AM  Result Value Ref Range   WBC 7.8 4.0 - 10.5 K/uL   RBC 3.66 (L) 3.87 - 5.11 MIL/uL   Hemoglobin 8.8 (L) 12.0 - 15.0 g/dL   HCT 09.6 (L) 04.5 - 40.9 %   MCV 75.7 (L) 78.0 - 100.0 fL   MCH 24.0 (L)  26.0 - 34.0 pg   MCHC 31.8 30.0 - 36.0 g/dL   RDW 81.1 (H) 91.4 - 78.2 %   Platelets 229 150 - 400 K/uL   Neutrophils Relative % 77 %   Neutro Abs 5.9 1.7 - 7.7 K/uL   Lymphocytes Relative 20 %   Lymphs Abs 1.6 0.7 - 4.0 K/uL   Monocytes Relative 3 %   Monocytes Absolute 0.3 0.1 - 1.0 K/uL   Eosinophils Relative 0 %   Eosinophils Absolute 0.0 0.0 - 0.7 K/uL   Basophils Relative 0 %   Basophils Absolute 0.0 0.0 - 0.1 K/uL  Comprehensive metabolic panel   Collection Time: 10/19/17  5:33 AM  Result Value Ref Range   Sodium 135 135 - 145 mmol/L   Potassium 3.1 (L) 3.5 - 5.1 mmol/L   Chloride 103 101 - 111 mmol/L   CO2 23 22 - 32 mmol/L   Glucose, Bld 86 65 - 99 mg/dL   BUN 18 6 - 20 mg/dL   Creatinine, Ser 9.56 (H) 0.44 - 1.00 mg/dL   Calcium 7.6 (L) 8.9 - 10.3 mg/dL   Total Protein 6.5 6.5 - 8.1 g/dL   Albumin 2.6 (L) 3.5 - 5.0 g/dL   AST 36 15 - 41 U/L   ALT 18 14 - 54 U/L   Alkaline Phosphatase 127 (H) 38 - 126 U/L   Total Bilirubin 0.5 0.3 - 1.2 mg/dL   GFR calc non Af Amer >60 >60 mL/min   GFR calc Af Amer >60 >60 mL/min   Anion gap 9 5 - 15    Imaging Studies:      Medications:  Scheduled . hydrALAZINE  10 mg Oral Q8H  . labetalol  800 mg Oral TID  . loratadine  10 mg Oral Daily  . NIFEdipine  30 mg Oral BID  . potassium chloride  40 mEq Oral TID  . prenatal multivitamin  1 tablet Oral Q1200   I have reviewed the patient's current medications.  ASSESSMENT: [id number] [redacted]w[redacted]d Estimated Date of Delivery: 12/15/17  Patient Active Problem List   Diagnosis Date Noted  . Hypokalemia 10/17/2017  . Chronic hypertension with superimposed severe preeclampsia 10/16/2017  . Anemia 05/20/2017  . Supervision of high risk pregnancy, antepartum 04/30/2017  . Chronic hypertension during pregnancy, antepartum 04/08/2017  . History of cesarean section 03/03/2016  . History of preterm delivery 03/03/2016  . HTN (hypertension) 05/19/2012    PLAN: Continue I&O Repeat creatinine  in am Discussed induction so at least considering induction, but scheduled for repeat Caesarean section in am, NPO after MN   Luther H Eure 10/19/2017,7:28 AM

## 2017-10-19 NOTE — Progress Notes (Addendum)
   10/19/17 2143  Vital Signs  BP (!) 175/121  BP Location Left Arm  Patient Position (if appropriate) Semi-fowlers  BP Method Automatic  Pulse Rate 80  Pulse Rate Source Monitor  Oxygen Therapy  SpO2 99 %  O2 Device Room Air  Labetalol 40 mg IV administered as ordered. We will reassess B/P in 10 minutes.  2212:MD seen on unit and he was updated.

## 2017-10-19 NOTE — Progress Notes (Signed)
   10/19/17 2227  Vital Signs  BP (!) 171/114  BP Location Left Arm  Patient Position (if appropriate) Semi-fowlers  BP Method Automatic  Pulse Rate 81  Pulse Rate Source Monitor  Resp 16  post Labetalol IV series

## 2017-10-19 NOTE — Progress Notes (Signed)
2100: 45F Foley Catheter inserted as ordered.. Pt was educated on the reason and risk of having a  F/C placed. Pt tolerated procedure well and verbalized understanding.  0533: CBC & Cmet collected.Urine out put remain approximately 50 mls/hr

## 2017-10-19 NOTE — Progress Notes (Signed)
   10/19/17 2325  Vital Signs  BP (!) 158/104  BP Location Left Arm  Patient Position (if appropriate) Semi-fowlers  BP Method Automatic  Pulse Rate 83  Pulse Rate Source Monitor  Resp 16  Oxygen Therapy  SpO2 98 %  O2 Device Room Air  Dr Debroah LoopArnold called and informed of B/P post labetalol and Hydralazine series. We were instructed to repeat B/P in a couple of hours.

## 2017-10-19 NOTE — Progress Notes (Signed)
   10/19/17 2249  Vital Signs  BP (!) 176/116  BP Location Left Arm  Patient Position (if appropriate) Semi-fowlers  BP Method Automatic  Pulse Rate 85  Pulse Rate Source Monitor  Resp 16  post Hydralazine 10mg  IV

## 2017-10-19 NOTE — Progress Notes (Addendum)
   10/19/17 2105  Vitals  Temp 98.5 F (36.9 C)  Temp Source Oral  BP (!) 176/112  BP Location Right Arm  BP Method Automatic  Patient Position (if appropriate) Semi-fowlers  Pulse Rate 75  Pulse Rate Source Monitor  Oxygen Therapy  SpO2 100 %  O2 Device Room Air  Dr Debroah LoopArnold called and informed of elevated B/P . Scheduled  Labetaol 800 mg po and Hydralazine 10 mg administered. PS new order.  2131: Labetalol 20 mg administered as ordered. We will reassess B/P in 10 minutes

## 2017-10-19 NOTE — Progress Notes (Signed)
   10/19/17 2203  Vital Signs  BP (!) 169/114  BP Location Left Arm  Patient Position (if appropriate) Semi-fowlers  BP Method Automatic  Pulse Rate 78  Pulse Rate Source Monitor  Resp 16  Oxygen Therapy  O2 Device Room Air  Labetalol 80 mg IV administered. We will reassess B/P in 10 minutes.

## 2017-10-20 ENCOUNTER — Inpatient Hospital Stay (HOSPITAL_COMMUNITY): Payer: Medicaid Other | Admitting: Anesthesiology

## 2017-10-20 ENCOUNTER — Encounter (HOSPITAL_COMMUNITY): Payer: Self-pay | Admitting: Neonatal-Perinatal Medicine

## 2017-10-20 ENCOUNTER — Encounter (HOSPITAL_COMMUNITY)
Admission: AD | Disposition: A | Discharge status: Home / Self Care | Payer: Self-pay | Source: Ambulatory Visit | Attending: Obstetrics & Gynecology

## 2017-10-20 DIAGNOSIS — Z98891 History of uterine scar from previous surgery: Secondary | ICD-10-CM

## 2017-10-20 DIAGNOSIS — O34211 Maternal care for low transverse scar from previous cesarean delivery: Secondary | ICD-10-CM

## 2017-10-20 DIAGNOSIS — O1413 Severe pre-eclampsia, third trimester: Secondary | ICD-10-CM

## 2017-10-20 DIAGNOSIS — Z3A32 32 weeks gestation of pregnancy: Secondary | ICD-10-CM

## 2017-10-20 LAB — CREATININE, SERUM
CREATININE: 0.9 mg/dL (ref 0.44–1.00)
GFR calc Af Amer: >60 mL/min (ref >60)
GFR calc non Af Amer: >60 mL/min (ref >60)

## 2017-10-20 LAB — COMPREHENSIVE METABOLIC PANEL
ALBUMIN: 2.4 g/dL — AB (ref 3.5–5.0)
ALK PHOS: 127 U/L — AB (ref 38–126)
ALT: 48 U/L (ref 14–54)
ANION GAP: 9 (ref 5–15)
AST: 77 U/L — ABNORMAL HIGH (ref 15–41)
BUN: 19 mg/dL (ref 6–20)
CALCIUM: 7.9 mg/dL — AB (ref 8.9–10.3)
CHLORIDE: 104 mmol/L (ref 101–111)
CO2: 22 mmol/L (ref 22–32)
CREATININE: 0.96 mg/dL (ref 0.44–1.00)
GFR calc Af Amer: >60 mL/min (ref >60)
GFR calc non Af Amer: >60 mL/min (ref >60)
Glucose, Bld: 75 mg/dL (ref 65–99)
Potassium: 3.4 mmol/L — ABNORMAL LOW (ref 3.5–5.1)
SODIUM: 135 mmol/L (ref 135–145)
Total Bilirubin: 0.6 mg/dL (ref 0.3–1.2)
Total Protein: 5.6 g/dL — ABNORMAL LOW (ref 6.5–8.1)

## 2017-10-20 LAB — CBC
HCT: 28.6 % — ABNORMAL LOW (ref 36.0–46.0)
Hemoglobin: 9.1 g/dL — ABNORMAL LOW (ref 12.0–15.0)
MCH: 24.3 pg — ABNORMAL LOW (ref 26.0–34.0)
MCHC: 31.8 g/dL (ref 30.0–36.0)
MCV: 76.5 fL — AB (ref 78.0–100.0)
PLATELETS: 245 10*3/uL (ref 150–400)
RBC: 3.74 MIL/uL — ABNORMAL LOW (ref 3.87–5.11)
RDW: 17.2 % — AB (ref 11.5–15.5)
WBC: 9.6 10*3/uL (ref 4.0–10.5)

## 2017-10-20 SURGERY — Surgical Case
Anesthesia: Spinal

## 2017-10-20 MED ORDER — TETANUS-DIPHTH-ACELL PERTUSSIS 5-2.5-18.5 LF-MCG/0.5 IM SUSP: 0.5000 mL | Freq: Once | INTRAMUSCULAR | Status: DC

## 2017-10-20 MED ORDER — ACETAMINOPHEN 325 MG PO TABS: 650.0000 mg | ORAL_TABLET | ORAL | Status: DC | PRN

## 2017-10-20 MED ORDER — OXYCODONE-ACETAMINOPHEN 5-325 MG PO TABS
2.0000 | ORAL_TABLET | ORAL | Status: DC | PRN
  Administered 2017-10-22: 2 via ORAL
  Filled 2017-10-20 (×2): qty 2

## 2017-10-20 MED ORDER — KETOROLAC TROMETHAMINE 30 MG/ML IJ SOLN: 30.0000 mg | Freq: Four times a day (QID) | INTRAMUSCULAR | Status: AC | PRN

## 2017-10-20 MED ORDER — COCONUT OIL OIL: 1.0000 "application " | TOPICAL_OIL | Status: DC | PRN

## 2017-10-20 MED ORDER — EPHEDRINE SULFATE 50 MG/ML IJ SOLN
INTRAMUSCULAR | Status: DC | PRN
  Administered 2017-10-20 (×2): 10 mg via INTRAVENOUS

## 2017-10-20 MED ORDER — HYDROMORPHONE HCL 1 MG/ML IJ SOLN
1.0000 mg | Freq: Once | INTRAMUSCULAR | Status: AC
  Administered 2017-10-20: 1 mg via INTRAVENOUS
  Filled 2017-10-20: qty 1

## 2017-10-20 MED ORDER — PHENYLEPHRINE 8 MG IN D5W 100 ML (0.08MG/ML) PREMIX OPTIME
INJECTION | INTRAVENOUS | Status: DC | PRN
  Administered 2017-10-20: 60 ug/min via INTRAVENOUS

## 2017-10-20 MED ORDER — MORPHINE SULFATE (PF) 0.5 MG/ML IJ SOLN
INTRAMUSCULAR | Status: AC
  Filled 2017-10-20: qty 10

## 2017-10-20 MED ORDER — LABETALOL HCL 5 MG/ML IV SOLN: 20.0000 mg | INTRAVENOUS | Status: DC | PRN

## 2017-10-20 MED ORDER — OXYTOCIN 10 UNIT/ML IJ SOLN
INTRAMUSCULAR | Status: AC
  Filled 2017-10-20: qty 4

## 2017-10-20 MED ORDER — CEFAZOLIN SODIUM-DEXTROSE 2-3 GM-%(50ML) IV SOLR
INTRAVENOUS | Status: DC | PRN
  Administered 2017-10-20: 2 g via INTRAVENOUS

## 2017-10-20 MED ORDER — METOCLOPRAMIDE HCL 5 MG/ML IJ SOLN: 10.0000 mg | Freq: Once | INTRAMUSCULAR | Status: DC | PRN

## 2017-10-20 MED ORDER — MEPERIDINE HCL 25 MG/ML IJ SOLN: 6.2500 mg | INTRAMUSCULAR | Status: DC | PRN

## 2017-10-20 MED ORDER — DEXAMETHASONE SODIUM PHOSPHATE 4 MG/ML IJ SOLN
INTRAMUSCULAR | Status: AC
  Filled 2017-10-20: qty 1

## 2017-10-20 MED ORDER — SCOPOLAMINE 1 MG/3DAYS TD PT72
MEDICATED_PATCH | TRANSDERMAL | Status: DC | PRN
  Administered 2017-10-20: 1 via TRANSDERMAL

## 2017-10-20 MED ORDER — FENTANYL CITRATE (PF) 100 MCG/2ML IJ SOLN
INTRAMUSCULAR | Status: DC | PRN
  Administered 2017-10-20: 20 ug via INTRATHECAL

## 2017-10-20 MED ORDER — DIPHENHYDRAMINE HCL 25 MG PO CAPS: 25.0000 mg | ORAL_CAPSULE | Freq: Four times a day (QID) | ORAL | Status: DC | PRN

## 2017-10-20 MED ORDER — FENTANYL CITRATE (PF) 100 MCG/2ML IJ SOLN
INTRAMUSCULAR | Status: AC
  Filled 2017-10-20: qty 2

## 2017-10-20 MED ORDER — ONDANSETRON HCL 4 MG/2ML IJ SOLN
INTRAMUSCULAR | Status: AC
  Filled 2017-10-20: qty 2

## 2017-10-20 MED ORDER — DIBUCAINE 1 % RE OINT: 1.0000 "application " | TOPICAL_OINTMENT | RECTAL | Status: DC | PRN

## 2017-10-20 MED ORDER — MAGNESIUM SULFATE BOLUS VIA INFUSION
3.0000 g | Freq: Once | INTRAVENOUS | Status: AC
  Administered 2017-10-20: 3 g via INTRAVENOUS
  Filled 2017-10-20: qty 500

## 2017-10-20 MED ORDER — ONDANSETRON HCL 4 MG/2ML IJ SOLN
INTRAMUSCULAR | Status: DC | PRN
  Administered 2017-10-20: 4 mg via INTRAVENOUS

## 2017-10-20 MED ORDER — FENTANYL CITRATE (PF) 100 MCG/2ML IJ SOLN: 25.0000 ug | INTRAMUSCULAR | Status: DC | PRN

## 2017-10-20 MED ORDER — KETOROLAC TROMETHAMINE 30 MG/ML IJ SOLN
30.0000 mg | Freq: Four times a day (QID) | INTRAMUSCULAR | Status: AC | PRN
  Administered 2017-10-20: 30 mg via INTRAMUSCULAR

## 2017-10-20 MED ORDER — PRENATAL MULTIVITAMIN CH
1.0000 | ORAL_TABLET | Freq: Every day | ORAL | Status: DC
  Administered 2017-10-21 – 2017-10-23 (×3): 1 via ORAL
  Filled 2017-10-20 (×3): qty 1

## 2017-10-20 MED ORDER — OXYTOCIN 40 UNITS IN LACTATED RINGERS INFUSION - SIMPLE MED: 2.5000 [IU]/h | INTRAVENOUS | Status: AC

## 2017-10-20 MED ORDER — HYDRALAZINE HCL 20 MG/ML IJ SOLN: 10.0000 mg | Freq: Once | INTRAMUSCULAR | Status: DC | PRN

## 2017-10-20 MED ORDER — MORPHINE SULFATE (PF) 0.5 MG/ML IJ SOLN
INTRAMUSCULAR | Status: DC | PRN
  Administered 2017-10-20: .2 mg via INTRATHECAL

## 2017-10-20 MED ORDER — SIMETHICONE 80 MG PO CHEW
80.0000 mg | CHEWABLE_TABLET | ORAL | Status: DC
  Administered 2017-10-20 – 2017-10-22 (×3): 80 mg via ORAL
  Filled 2017-10-20 (×3): qty 1

## 2017-10-20 MED ORDER — BUPIVACAINE IN DEXTROSE 0.75-8.25 % IT SOLN
INTRATHECAL | Status: DC | PRN
  Administered 2017-10-20: 1.8 mL via INTRATHECAL

## 2017-10-20 MED ORDER — SIMETHICONE 80 MG PO CHEW: 80.0000 mg | CHEWABLE_TABLET | ORAL | Status: DC | PRN

## 2017-10-20 MED ORDER — OXYTOCIN 10 UNIT/ML IJ SOLN
INTRAVENOUS | Status: DC | PRN
  Administered 2017-10-20: 40 [IU] via INTRAVENOUS

## 2017-10-20 MED ORDER — HYDRALAZINE HCL 20 MG/ML IJ SOLN
10.0000 mg | Freq: Once | INTRAMUSCULAR | Status: AC
  Administered 2017-10-20: 10 mg via INTRAVENOUS
  Filled 2017-10-20: qty 1

## 2017-10-20 MED ORDER — SCOPOLAMINE 1 MG/3DAYS TD PT72
MEDICATED_PATCH | TRANSDERMAL | Status: AC
  Filled 2017-10-20: qty 1

## 2017-10-20 MED ORDER — MAGNESIUM SULFATE 40 G IN LACTATED RINGERS - SIMPLE
1.0000 g/h | INTRAVENOUS | Status: AC
  Filled 2017-10-20: qty 500

## 2017-10-20 MED ORDER — SIMETHICONE 80 MG PO CHEW
80.0000 mg | CHEWABLE_TABLET | Freq: Three times a day (TID) | ORAL | Status: DC
  Administered 2017-10-20 – 2017-10-23 (×9): 80 mg via ORAL
  Filled 2017-10-20 (×9): qty 1

## 2017-10-20 MED ORDER — SODIUM CHLORIDE 0.9 % IR SOLN
Status: DC | PRN
  Administered 2017-10-20: 1000 mL

## 2017-10-20 MED ORDER — LACTATED RINGERS IV SOLN
INTRAVENOUS | Status: DC | PRN
  Administered 2017-10-20: 11:00:00 via INTRAVENOUS

## 2017-10-20 MED ORDER — OXYCODONE-ACETAMINOPHEN 5-325 MG PO TABS
1.0000 | ORAL_TABLET | ORAL | Status: DC | PRN
  Administered 2017-10-21 (×3): 1 via ORAL
  Filled 2017-10-20 (×2): qty 1

## 2017-10-20 MED ORDER — SODIUM CHLORIDE 0.9% FLUSH: 3.0000 mL | INTRAVENOUS | Status: DC | PRN

## 2017-10-20 MED ORDER — KETOROLAC TROMETHAMINE 30 MG/ML IJ SOLN
INTRAMUSCULAR | Status: AC
  Filled 2017-10-20: qty 1

## 2017-10-20 MED ORDER — BUPIVACAINE IN DEXTROSE 0.75-8.25 % IT SOLN
INTRATHECAL | Status: AC
  Filled 2017-10-20: qty 2

## 2017-10-20 MED ORDER — WITCH HAZEL-GLYCERIN EX PADS: 1.0000 "application " | MEDICATED_PAD | CUTANEOUS | Status: DC | PRN

## 2017-10-20 MED ORDER — DEXAMETHASONE SODIUM PHOSPHATE 4 MG/ML IJ SOLN
INTRAMUSCULAR | Status: DC | PRN
  Administered 2017-10-20: 4 mg via INTRAVENOUS

## 2017-10-20 MED ORDER — NALOXONE HCL 0.4 MG/ML IJ SOLN: 0.4000 mg | INTRAMUSCULAR | Status: DC | PRN

## 2017-10-20 MED ORDER — IBUPROFEN 600 MG PO TABS
600.0000 mg | ORAL_TABLET | Freq: Four times a day (QID) | ORAL | Status: DC
  Administered 2017-10-20 – 2017-10-23 (×12): 600 mg via ORAL
  Filled 2017-10-20 (×12): qty 1

## 2017-10-20 MED ORDER — PHENYLEPHRINE 8 MG IN D5W 100 ML (0.08MG/ML) PREMIX OPTIME
INJECTION | INTRAVENOUS | Status: AC
  Filled 2017-10-20: qty 100

## 2017-10-20 MED ORDER — MENTHOL 3 MG MT LOZG: 1.0000 | LOZENGE | OROMUCOSAL | Status: DC | PRN

## 2017-10-20 MED ORDER — SOD CITRATE-CITRIC ACID 500-334 MG/5ML PO SOLN
ORAL | Status: AC
  Administered 2017-10-20: 30 mL
  Filled 2017-10-20: qty 15

## 2017-10-20 MED ORDER — SENNOSIDES-DOCUSATE SODIUM 8.6-50 MG PO TABS
2.0000 | ORAL_TABLET | ORAL | Status: DC
  Administered 2017-10-20 – 2017-10-22 (×3): 2 via ORAL
  Filled 2017-10-20 (×3): qty 2

## 2017-10-20 MED ORDER — EPHEDRINE 5 MG/ML INJ
INTRAVENOUS | Status: AC
  Filled 2017-10-20: qty 10

## 2017-10-20 MED ORDER — ENOXAPARIN SODIUM 40 MG/0.4ML ~~LOC~~ SOLN
40.0000 mg | SUBCUTANEOUS | Status: DC
  Administered 2017-10-21 – 2017-10-23 (×3): 40 mg via SUBCUTANEOUS
  Filled 2017-10-20 (×4): qty 0.4

## 2017-10-20 MED ORDER — CEFAZOLIN SODIUM-DEXTROSE 2-4 GM/100ML-% IV SOLN
INTRAVENOUS | Status: AC
  Filled 2017-10-20: qty 100

## 2017-10-20 MED ORDER — ONDANSETRON HCL 4 MG/2ML IJ SOLN: 4.0000 mg | Freq: Three times a day (TID) | INTRAMUSCULAR | Status: DC | PRN

## 2017-10-20 MED ORDER — LACTATED RINGERS IV SOLN
INTRAVENOUS | Status: DC
  Administered 2017-10-20 – 2017-10-21 (×3): via INTRAVENOUS

## 2017-10-20 MED ORDER — HYDRALAZINE HCL 10 MG PO TABS
10.0000 mg | ORAL_TABLET | Freq: Once | ORAL | Status: AC
  Administered 2017-10-20: 10 mg via ORAL
  Filled 2017-10-20: qty 1

## 2017-10-20 MED ORDER — ZOLPIDEM TARTRATE 5 MG PO TABS: 5.0000 mg | ORAL_TABLET | Freq: Every evening | ORAL | Status: DC | PRN

## 2017-10-20 SURGICAL SUPPLY — 36 items
CHLORAPREP W/TINT 26ML (MISCELLANEOUS) ×6 IMPLANT
CLAMP CORD UMBIL (MISCELLANEOUS) IMPLANT
CLOTH BEACON ORANGE TIMEOUT ST (SAFETY) ×3 IMPLANT
DERMABOND ADVANCED (GAUZE/BANDAGES/DRESSINGS) ×2
DERMABOND ADVANCED .7 DNX12 (GAUZE/BANDAGES/DRESSINGS) ×1 IMPLANT
DRSG OPSITE POSTOP 4X10 (GAUZE/BANDAGES/DRESSINGS) ×3 IMPLANT
ELECT REM PT RETURN 9FT ADLT (ELECTROSURGICAL) ×3
ELECTRODE REM PT RTRN 9FT ADLT (ELECTROSURGICAL) ×1 IMPLANT
EXTRACTOR VACUUM BELL STYLE (SUCTIONS) IMPLANT
GLOVE BIOGEL PI IND STRL 7.0 (GLOVE) ×1 IMPLANT
GLOVE BIOGEL PI IND STRL 8 (GLOVE) ×1 IMPLANT
GLOVE BIOGEL PI INDICATOR 7.0 (GLOVE) ×2
GLOVE BIOGEL PI INDICATOR 8 (GLOVE) ×2
GLOVE ECLIPSE 8.0 STRL XLNG CF (GLOVE) ×3 IMPLANT
GOWN STRL REUS W/TWL LRG LVL3 (GOWN DISPOSABLE) ×6 IMPLANT
KIT ABG SYR 3ML LUER SLIP (SYRINGE) ×3 IMPLANT
NEEDLE HYPO 18GX1.5 BLUNT FILL (NEEDLE) ×3 IMPLANT
NEEDLE HYPO 22GX1.5 SAFETY (NEEDLE) ×3 IMPLANT
NEEDLE HYPO 25X5/8 SAFETYGLIDE (NEEDLE) ×3 IMPLANT
NS IRRIG 1000ML POUR BTL (IV SOLUTION) ×3 IMPLANT
PACK C SECTION WH (CUSTOM PROCEDURE TRAY) ×3 IMPLANT
PAD OB MATERNITY 4.3X12.25 (PERSONAL CARE ITEMS) ×3 IMPLANT
PENCIL SMOKE EVAC W/HOLSTER (ELECTROSURGICAL) ×3 IMPLANT
RTRCTR C-SECT PINK 25CM LRG (MISCELLANEOUS) IMPLANT
SUT CHROMIC 0 CT 1 (SUTURE) ×3 IMPLANT
SUT MNCRL 0 VIOLET CTX 36 (SUTURE) ×3 IMPLANT
SUT MONOCRYL 0 CTX 36 (SUTURE) ×6
SUT PLAIN 2 0 (SUTURE)
SUT PLAIN 2 0 XLH (SUTURE) IMPLANT
SUT PLAIN ABS 2-0 CT1 27XMFL (SUTURE) IMPLANT
SUT VIC AB 0 CTX 36 (SUTURE) ×2
SUT VIC AB 0 CTX36XBRD ANBCTRL (SUTURE) ×1 IMPLANT
SUT VIC AB 4-0 KS 27 (SUTURE) IMPLANT
SYR 20CC LL (SYRINGE) ×6 IMPLANT
TOWEL OR 17X24 6PK STRL BLUE (TOWEL DISPOSABLE) ×3 IMPLANT
TRAY FOLEY BAG SILVER LF 14FR (SET/KITS/TRAYS/PACK) IMPLANT

## 2017-10-20 NOTE — Op Note (Signed)
Eileen Miller PROCEDURE DATE: 10/20/2017  PREOPERATIVE DIAGNOSES: Intrauterine pregnancy at [redacted]w[redacted]d weeks gestation; severe preeclampsia and repeat cesarean section, patient declines TOLAC  POSTOPERATIVE DIAGNOSES: The same  PROCEDURE: Repeat Low Transverse Cesarean Section  SURGEON, primary: Duane Lope, MD SURGEON, fellow: Rolm Bookbinder, DO  ANESTHESIOLOGY TEAM: Anesthesiologist: Mal Amabile, MD CRNA: Collier Salina, Eulis Manly., CRNA  INDICATIONS: Eileen Miller is a 22 y.o. [phone] at [redacted]w[redacted]d here for cesarean section secondary to the indications listed under preoperative diagnoses; please see preoperative note for further details.  The risks of cesarean section were discussed with the patient including but were not limited to: bleeding which may require transfusion or reoperation; infection which may require antibiotics; injury to bowel, bladder, ureters or other surrounding organs; injury to the fetus; need for additional procedures including hysterectomy in the event of a Miller-threatening hemorrhage; placental abnormalities wth subsequent pregnancies, incisional problems, thromboembolic phenomenon and other postoperative/anesthesia complications.   The patient concurred with the proposed plan, giving informed written consent for the procedure.    FINDINGS:  Viable female infant in cephalic presentation.  Apgars 8 and 10.  Clear amniotic fluid.  Intact placenta, three vessel cord.  Normal uterus, fallopian tubes and ovaries bilaterally.  ANESTHESIA: Spinal  ESTIMATED BLOOD LOSS: 821 ml SPECIMENS: Placenta sent to pathology COMPLICATIONS: None immediate  PROCEDURE IN DETAIL:  The patient preoperatively received intravenous antibiotics and had sequential compression devices applied to her lower extremities.  She was then taken to the operating room where spinal anesthesia was administered and was found to be adequate. She was then placed in a dorsal supine position with a leftward tilt, and  prepped and draped in a sterile manner.  A foley catheter was was previously placed into her bladder on 3rd floor. After an adequate timeout was performed, a Pfannenstiel skin incision was made with scalpel over her preexisting scarand carried through to the underlying layer of fascia. The fascia was incised in the midline, and this incision was extended bilaterally using the Mayo scissors.  Kocher clamps were applied to the superior aspect of the fascial incision and the underlying rectus muscles were dissected off bluntly. The rectus muscles were separated in the midline bluntly and the peritoneum was entered bluntly. Attention was turned to the lower uterine segment where a low transverse hysterotomy was made with a scalpel and extended bilaterally bluntly.  The infant was in frank breech position and was successfully delivered, the cord was clamped and cut after one minute, and the infant was handed over to the awaiting neonatology team. Uterine massage was then administered, and the placenta delivered intact with a three-vessel cord. The uterus was then cleared of clots and debris.  The hysterotomy was closed with 0 Monocryl in a running locked fashion, and then, figure-of-eight 0 Monocryl interrupted stitches were placed across hysterotomy for bleeding control. The pelvis was cleared of all clot and debris. Hemostasis was confirmed on all surfaces.  The peritoneum and rectus muscles were closed simultaneously with a 0 Vicryl running stitch. The fascia was then closed using 0 Vicryl in a running fashion.  The subcutaneous layer was irrigated. The skin was closed with a 4-0 Vicryl Keith needle in subcuticular fashion. The patient tolerated the procedure well. Sponge, lap, instrument and needle counts were correct x 3.  She was taken to the recovery room in stable condition.   Will give magnesium bolus and infusion for 24 hours postoperatively for preeclampsia with severe features.   Rolm Bookbinder, DO Faculty  Practice, [facility]  [place]

## 2017-10-20 NOTE — Anesthesia Postprocedure Evaluation (Signed)
Anesthesia Post Note  Patient: Eileen Miller  Procedure(s) Performed: CESAREAN SECTION (N/A )     Patient location during evaluation: Mother Baby Anesthesia Type: Spinal Level of consciousness: awake and alert and oriented Pain management: satisfactory to patient Vital Signs Assessment: post-procedure vital signs reviewed and stable Respiratory status: spontaneous breathing and nonlabored ventilation Cardiovascular status: stable Postop Assessment: no headache, no backache, patient able to bend at knees, no signs of nausea or vomiting and adequate PO intake Anesthetic complications: no    Last Vitals:  Vitals:   10/20/17 1330 10/20/17 1437  BP: 121/74 136/89  Pulse: 83 72  Resp: 16 16  Temp: 36.8 C (!) 36.1 C  SpO2: 100% 99%    Last Pain:  Vitals:   10/20/17 1440  TempSrc:   PainSc: 8    Pain Goal: Patients Stated Pain Goal: 2 (10/20/17 1440)               Madison HickmanGREGORY,Kamiah Fite

## 2017-10-20 NOTE — Progress Notes (Signed)
Pt being prepped and taken to OR. FHR 135-140.

## 2017-10-20 NOTE — Anesthesia Preprocedure Evaluation (Addendum)
Anesthesia Evaluation  Patient identified by MRN, date of birth, ID band Patient awake    Reviewed: Allergy & Precautions, NPO status , Patient's Chart, lab work & pertinent test results, reviewed documented beta blocker date and time   Airway Mallampati: II  TM Distance: >3 FB Neck ROM: Full    Dental no notable dental hx. (+) Teeth Intact   Pulmonary neg pulmonary ROS,    Pulmonary exam normal breath sounds clear to auscultation       Cardiovascular hypertension, Pt. on medications and Pt. on home beta blockers  Rhythm:Regular Rate:Normal + Systolic murmurs Severe pre eclampsia superimposed on cHTN III/VI SEM at LLSB   Neuro/Psych negative neurological ROS  negative psych ROS   GI/Hepatic Neg liver ROS, GERD  Medicated and Controlled,  Endo/Other  negative endocrine ROS  Renal/GU negative Renal ROS  negative genitourinary   Musculoskeletal negative musculoskeletal ROS (+)   Abdominal   Peds  Hematology  (+) anemia ,   Anesthesia Other Findings   Reproductive/Obstetrics (+) Pregnancy Previous C/section                            Anesthesia Physical Anesthesia Plan  ASA: III  Anesthesia Plan: Spinal   Post-op Pain Management:    Induction:   PONV Risk Score and Plan: 4 or greater and Scopolamine patch - Pre-op, Ondansetron, Dexamethasone and Treatment may vary due to age or medical condition  Airway Management Planned: Natural Airway  Additional Equipment:   Intra-op Plan:   Post-operative Plan:   Informed Consent: I have reviewed the patients History and Physical, chart, labs and discussed the procedure including the risks, benefits and alternatives for the proposed anesthesia with the patient or authorized representative who has indicated his/her understanding and acceptance.   Dental advisory given  Plan Discussed with: CRNA, Anesthesiologist and Surgeon  Anesthesia  Plan Comments:         Anesthesia Quick Evaluation

## 2017-10-20 NOTE — Anesthesia Postprocedure Evaluation (Signed)
Anesthesia Post Note  Patient: Eileen Miller  Procedure(s) Performed: CESAREAN SECTION (N/A )     Patient location during evaluation: PACU Anesthesia Type: Spinal Level of consciousness: oriented and awake and alert Pain management: pain level controlled Vital Signs Assessment: post-procedure vital signs reviewed and stable Respiratory status: spontaneous breathing, respiratory function stable and nonlabored ventilation Cardiovascular status: blood pressure returned to baseline and stable Postop Assessment: no headache, no backache, no apparent nausea or vomiting, spinal receding and patient able to bend at knees Anesthetic complications: no    Last Vitals:  Vitals:   10/20/17 1255 10/20/17 1300  BP:  132/85  Pulse: 80 78  Resp: 13 15  Temp:    SpO2: 99% 99%    Last Pain:  Vitals:   10/20/17 1300  TempSrc:   PainSc: (P) 0-No pain   Pain Goal:                 Jynesis Nakamura A.

## 2017-10-20 NOTE — Transfer of Care (Signed)
Immediate Anesthesia Transfer of Care Note  Patient: Eileen Miller  Procedure(s) Performed: CESAREAN SECTION (N/A )  Patient Location: PACU  Anesthesia Type:Spinal  Level of Consciousness: awake, alert , oriented and patient cooperative  Airway & Oxygen Therapy: Patient Spontanous Breathing  Post-op Assessment: Report given to RN, Post -op Vital signs reviewed and stable, Patient moving all extremities and Patient able to stick tongue midline  Post vital signs: Reviewed and stable  Last Vitals:  Vitals:   10/20/17 0953 10/20/17 1200  BP: (!) 154/101 127/72  Pulse: 97 77  Resp:  16  Temp:  36.7 C  SpO2:  99%    Last Pain:  Vitals:   10/20/17 1200  TempSrc: Oral  PainSc:          Complications: No apparent anesthesia complications

## 2017-10-20 NOTE — Progress Notes (Signed)
Tried to ambulate pt but as soon as she started to sit up in bed, pt started complaining of dizziness. BP 132/92. Pt on 1g/hr mag. Will try again later.

## 2017-10-20 NOTE — Progress Notes (Signed)
   10/20/17 0606  Vital Signs  BP (!) 160/107  BP Location Right Arm  Patient Position (if appropriate) Semi-fowlers  BP Method Automatic  Pulse Rate 78  Pulse Rate Source Monitor  Resp 18  Oxygen Therapy  SpO2 99 %  O2 Device Room Air  Dr Debroah LoopArnold informed of elevated B/P . Order received for Hydralazine 10 mg po x 1.

## 2017-10-20 NOTE — Progress Notes (Signed)
Betadine nose swabs performed, CHG bath given, and sodium citrate given.

## 2017-10-20 NOTE — Anesthesia Procedure Notes (Signed)
Spinal  Patient location during procedure: OR Start time: 10/20/2017 10:48 AM Staffing Anesthesiologist: Mal AmabileFoster, Tayari Yankee, MD Performed: anesthesiologist  Preanesthetic Checklist Completed: patient identified, site marked, surgical consent, pre-op evaluation, timeout performed, IV checked, risks and benefits discussed and monitors and equipment checked Spinal Block Patient position: sitting Prep: site prepped and draped and DuraPrep Patient monitoring: heart rate, cardiac monitor, continuous pulse ox and blood pressure Approach: midline Location: L3-4 Injection technique: single-shot Needle Needle type: Pencan  Needle gauge: 24 G Needle length: 9 cm Needle insertion depth: 5 cm Assessment Sensory level: T4 Additional Notes Patient tolerated procedure well. Adequate sensory level.

## 2017-10-20 NOTE — Addendum Note (Signed)
Addendum  created 10/20/17 1453 by Shanon PayorGregory, Zeta Bucy M, CRNA   Sign clinical note

## 2017-10-21 ENCOUNTER — Encounter (HOSPITAL_COMMUNITY): Payer: Self-pay | Admitting: *Deleted

## 2017-10-21 LAB — CBC
HCT: 19 % — ABNORMAL LOW (ref 36.0–46.0)
Hemoglobin: 6.2 g/dL — CL (ref 12.0–15.0)
MCH: 24.5 pg — AB (ref 26.0–34.0)
MCHC: 32.6 g/dL (ref 30.0–36.0)
MCV: 75.1 fL — AB (ref 78.0–100.0)
PLATELETS: 185 10*3/uL (ref 150–400)
RBC: 2.53 MIL/uL — ABNORMAL LOW (ref 3.87–5.11)
RDW: 17.2 % — AB (ref 11.5–15.5)
WBC: 13 10*3/uL — ABNORMAL HIGH (ref 4.0–10.5)

## 2017-10-21 LAB — COMPREHENSIVE METABOLIC PANEL
ALT: 50 U/L (ref 14–54)
AST: 69 U/L — AB (ref 15–41)
Albumin: 2.1 g/dL — ABNORMAL LOW (ref 3.5–5.0)
Alkaline Phosphatase: 109 U/L (ref 38–126)
Anion gap: 8 (ref 5–15)
BUN: 21 mg/dL — AB (ref 6–20)
CHLORIDE: 102 mmol/L (ref 101–111)
CO2: 22 mmol/L (ref 22–32)
CREATININE: 1.02 mg/dL — AB (ref 0.44–1.00)
Calcium: 7.9 mg/dL — ABNORMAL LOW (ref 8.9–10.3)
GFR calc Af Amer: >60 mL/min (ref >60)
GLUCOSE: 80 mg/dL (ref 65–99)
Potassium: 3.7 mmol/L (ref 3.5–5.1)
Sodium: 132 mmol/L — ABNORMAL LOW (ref 135–145)
Total Bilirubin: 0.5 mg/dL (ref 0.3–1.2)
Total Protein: 4.8 g/dL — ABNORMAL LOW (ref 6.5–8.1)

## 2017-10-21 LAB — RPR: RPR Ser Ql: NONREACTIVE

## 2017-10-21 LAB — PREPARE RBC (CROSSMATCH)

## 2017-10-21 MED ORDER — SODIUM CHLORIDE 0.9 % IV SOLN
Freq: Once | INTRAVENOUS | Status: AC
  Administered 2017-10-21: 10:00:00 via INTRAVENOUS

## 2017-10-21 MED ORDER — FUROSEMIDE 10 MG/ML IJ SOLN
40.0000 mg | Freq: Once | INTRAMUSCULAR | Status: AC
  Administered 2017-10-21: 40 mg via INTRAVENOUS
  Filled 2017-10-21: qty 4

## 2017-10-21 MED ORDER — FUROSEMIDE 10 MG/ML IJ SOLN
40.0000 mg | Freq: Once | INTRAMUSCULAR | Status: DC
  Filled 2017-10-21: qty 4

## 2017-10-21 MED ORDER — DIPHENHYDRAMINE HCL 25 MG PO CAPS
25.0000 mg | ORAL_CAPSULE | Freq: Once | ORAL | Status: AC
  Administered 2017-10-21: 25 mg via ORAL
  Filled 2017-10-21: qty 1

## 2017-10-21 NOTE — Progress Notes (Signed)
RN to ambulate patient once again. Once standing, patient started complaining of dizziness, nausea, and ringing in the ears. BP 130/79, HR 77.

## 2017-10-21 NOTE — Progress Notes (Addendum)
CRITICAL VALUE ALERT  Critical Value: Hgb 6.2   Date & Time Notied: 10/21/17 0609  Provider Notified: Dr. Despina HiddenEure   Orders Received/Actions taken: No new orders at this time.   Results for Eileen Miller, Eileen Miller (MRN [id number]) as of 10/21/2017 06:14  Ref. Range 10/21/2017 05:51  WBC Latest Ref Range: 4.0 - 10.5 K/uL 13.0 (H)  RBC Latest Ref Range: 3.87 - 5.11 MIL/uL 2.53 (L)  Hemoglobin Latest Ref Range: 12.0 - 15.0 g/dL 6.2 (LL)  HCT Latest Ref Range: 36.0 - 46.0 % 19.0 (L)  MCV Latest Ref Range: 78.0 - 100.0 fL 75.1 (L)  MCH Latest Ref Range: 26.0 - 34.0 pg 24.5 (L)  MCHC Latest Ref Range: 30.0 - 36.0 g/dL 40.932.6  RDW Latest Ref Range: 11.5 - 15.5 % 17.2 (H)  Platelets Latest Ref Range: 150 - 400 K/uL 185

## 2017-10-21 NOTE — Lactation Note (Signed)
This note was copied from a baby's chart. Lactation Consultation Note  Patient Name: Eileen Miller Reason for consult: Initial assessment;Late-preterm 34-36.6wks  LC consult: Mother receiving a blood transfusion and desires to wait until blood transfusion is complete before she begins pumping.  Supplies collected and shown to mother.  Supplies left at bedside for RN to explain after blood transfusion. Maternal Data    Feeding    LATCH Score                   Interventions    Lactation Tools Discussed/Used Initiated by:: (Mother getting blood transfusion; mother desires to wait until blood is in before pumping and RN to set up pump) Date initiated:: 10/21/17   Consult Status Consult Status: Follow-up    [name], [name], 2:43 PM

## 2017-10-21 NOTE — Progress Notes (Signed)
Subjective: Postpartum Day 1: Cesarean Delivery Patient reports incisional pain and tolerating PO.   Overall patient feels pretty good  Objective: Vital signs in last 24 hours: Temp:  [97 F (36.1 C)-98.2 F (36.8 C)] 97.9 F (36.6 C) (03/18 0726) Pulse Rate:  [64-97] 75 (03/18 0726) Resp:  [13-20] 16 (03/18 0726) BP: (121-176)/(72-120) 138/95 (03/18 0726) SpO2:  [95 %-100 %] 98 % (03/18 0726) Weight:  [186 lb (84.4 kg)-188 lb 6 oz (85.4 kg)] 186 lb (84.4 kg) (03/18 0527)  Vitals:   10/21/17 0611 10/21/17 0726  BP:  (!) 138/95  Pulse:  75  Resp: 18 16  Temp:  97.9 F (36.6 C)  SpO2:  98%    Physical Exam:  General: alert, cooperative and no distress Lochia: appropriate Uterine Fundus: firm Incision: no significant drainage, no significant erythema DVT Evaluation: No evidence of DVT seen on physical exam.  Recent Labs    10/20/17 1426 10/21/17 0551  HGB 9.1* 6.2*  HCT 28.6* 19.0*    Assessment/Plan: Status post Cesarean section. Doing well postoperatively.  Will transfuse 2 units PRBC with lasix between and after  Remove foley after second unit is in Magnesium goes off this afternoon BP still elevated continuing 3 agents  Lazaro ArmsLuther H Eure 10/21/2017, 7:32 AM

## 2017-10-22 LAB — CBC
HCT: 25.4 % — ABNORMAL LOW (ref 36.0–46.0)
HEMOGLOBIN: 8.5 g/dL — AB (ref 12.0–15.0)
MCH: 26.2 pg (ref 26.0–34.0)
MCHC: 33.5 g/dL (ref 30.0–36.0)
MCV: 78.4 fL (ref 78.0–100.0)
Platelets: 234 10*3/uL (ref 150–400)
RBC: 3.24 MIL/uL — AB (ref 3.87–5.11)
RDW: 16.9 % — ABNORMAL HIGH (ref 11.5–15.5)
WBC: 13.7 10*3/uL — ABNORMAL HIGH (ref 4.0–10.5)

## 2017-10-22 LAB — TYPE AND SCREEN
ABO/RH(D): O POS
ANTIBODY SCREEN: NEGATIVE
UNIT DIVISION: 0
Unit division: 0

## 2017-10-22 LAB — BPAM RBC
BLOOD PRODUCT EXPIRATION DATE: 201904082359
Blood Product Expiration Date: 201904042359
ISSUE DATE / TIME: 201903181014
ISSUE DATE / TIME: 201903181413
UNIT TYPE AND RH: 5100
Unit Type and Rh: 5100

## 2017-10-22 MED ORDER — TRIAMTERENE-HCTZ 37.5-25 MG PO TABS
2.0000 | ORAL_TABLET | Freq: Every day | ORAL | Status: DC
  Administered 2017-10-22 – 2017-10-23 (×2): 2 via ORAL
  Filled 2017-10-22 (×3): qty 2

## 2017-10-22 MED ORDER — AMLODIPINE BESYLATE 10 MG PO TABS
10.0000 mg | ORAL_TABLET | Freq: Every day | ORAL | Status: DC
  Administered 2017-10-22 – 2017-10-23 (×2): 10 mg via ORAL
  Filled 2017-10-22 (×2): qty 1

## 2017-10-22 NOTE — Progress Notes (Signed)
Subjective: Postpartum Day 2: Cesarean Delivery Patient reports feeling well with some incisional pain.  She denies chest pain, SOB, lightheadedness/dizziness  Objective: Vital signs in last 24 hours: Temp:  [97.8 F (36.6 C)-98.9 F (37.2 C)] 98.5 F (36.9 C) (03/19 0800) Pulse Rate:  [69-82] 78 (03/19 0800) Resp:  [16-18] 18 (03/19 0800) BP: (137-167)/(90-101) 167/98 (03/19 0847) SpO2:  [99 %-100 %] 100 % (03/19 0800)  Physical Exam:  General: alert, cooperative and no distress Lochia: appropriate Uterine Fundus: firm Incision: honeycomb dressing clean dry and intact DVT Evaluation: No evidence of DVT seen on physical exam.  Recent Labs    10/21/17 0551 10/22/17 0930  HGB 6.2* 8.5*  HCT 19.0* 25.4*    Assessment/Plan: Status post Cesarean section. Doing well postoperatively.  BP remain elevated- changes to antihypertensive medication made with patient now on maxzide and norvasc Post transfusion hemoglobin improved and patient is aymptomatic. Continue monitoring BP Consider discharge tomorrow  Abundio Teuscher 10/22/2017, 10:20 AM

## 2017-10-22 NOTE — Lactation Note (Signed)
This note was copied from a baby's chart. Lactation Consultation Note  Patient Name: Eileen Miller BJYNW'GToday's Date: 10/22/2017  Mom states she has not started pumping.  Baby is 3849 hours old.  Instructed on Symphony pump and assisted with pumping on initiation mode.  Hand expression taught and colostrum containers given.  Instructed to pump every 2-3 hours x 15 minutes.  Encouraged to call for assist prn.  Reviewed cleaning pump pieces.  Mom plans on purchasing a pump after discharge.   Maternal Data    Feeding Feeding Type: Donor Breast Milk  LATCH Score                   Interventions    Lactation Tools Discussed/Used     Consult Status      Huston FoleyMOULDEN, Minoru Chap S 10/22/2017, 12:31 PM

## 2017-10-23 MED ORDER — TRIAMTERENE-HCTZ 37.5-25 MG PO TABS: 2.0000 | ORAL_TABLET | Freq: Every day | ORAL | 6 refills | Status: DC

## 2017-10-23 MED ORDER — AMLODIPINE BESYLATE 10 MG PO TABS: 10.0000 mg | ORAL_TABLET | Freq: Every day | ORAL | 6 refills | Status: DC

## 2017-10-23 MED ORDER — IBUPROFEN 600 MG PO TABS: 600.0000 mg | ORAL_TABLET | Freq: Four times a day (QID) | ORAL | 0 refills | Status: DC

## 2017-10-23 MED ORDER — OXYCODONE-ACETAMINOPHEN 5-325 MG PO TABS: 1.0000 | ORAL_TABLET | ORAL | 0 refills | Status: DC | PRN

## 2017-10-23 NOTE — Progress Notes (Signed)
Discharge instructions given to patient, PP pre-E s/s and postpartum care reviewed. Patient verlbalizes understanding of follow-up and bp and pain meds.

## 2017-10-23 NOTE — Discharge Summary (Signed)
OB Discharge Summary     Patient Name: Eileen Miller DOB: [birth year] MRN: [id number]  Date of admission: 10/16/2017 Delivering MD: Lazaro Arms   Date of discharge: 10/23/2017  Admitting diagnosis: 31WKS HBP Intrauterine pregnancy: [redacted]w[redacted]d     Secondary diagnosis:  Principal Problem:   Chronic hypertension with superimposed severe preeclampsia Active Problems:   History of cesarean section   History of preterm delivery   Hypokalemia   S/P C-section  Hospital course:  Sceduled C/S   22 y.o. yo G2P0202 at [redacted]w[redacted]d was admitted to the hospital 10/16/2017 for scheduled cesarean section with the following indication:Elective Repeat in the setting of CHTN with superimposed severe preeclampsia Membrane Rupture Time/Date: 11:15 AM ,10/20/2017   Patient delivered a Viable infant.10/20/2017  Details of operation can be found in separate operative note.  Pateint had an uncomplicated postpartum course during which she received magnesium sulfate for seizure prophylaxis. Her blood pressure was better controlled on 3 agents. She is ambulating, tolerating a regular diet, passing flatus, and urinating well. Patient is discharged home in stable condition on  10/23/17         Physical exam  Vitals:   10/23/17 0609 10/23/17 0800 10/23/17 0850 10/23/17 1200  BP:  (!) 167/106 (!) 153/94 128/88  Pulse:  81 87 90  Resp: 18 18 18 18   Temp:  98.3 F (36.8 C)  98.6 F (37 C)  TempSrc:  Oral  Oral  SpO2:    99%  Weight:      Height:       General: alert, cooperative and no distress Lochia: appropriate Uterine Fundus: firm Incision: honeycomb dressing clean dry and intact DVT Evaluation: No evidence of DVT seen on physical exam. Labs: Lab Results  Component Value Date   WBC 13.7 (H) 10/22/2017   HGB 8.5 (L) 10/22/2017   HCT 25.4 (L) 10/22/2017   MCV 78.4 10/22/2017   PLT 234 10/22/2017   CMP Latest Ref Rng & Units 10/21/2017  Glucose 65 - 99 mg/dL 80  BUN 6 - 20 mg/dL 40(J)  Creatinine 8.11 -  1.00 mg/dL 9.14(N)  Sodium 829 - 562 mmol/L 132(L)  Potassium 3.5 - 5.1 mmol/L 3.7  Chloride 101 - 111 mmol/L 102  CO2 22 - 32 mmol/L 22  Calcium 8.9 - 10.3 mg/dL 7.9(L)  Total Protein 6.5 - 8.1 g/dL 4.8(L)  Total Bilirubin 0.3 - 1.2 mg/dL 0.5  Alkaline Phos 38 - 126 U/L 109  AST 15 - 41 U/L 69(H)  ALT 14 - 54 U/L 50    Discharge instruction: per After Visit Summary and "Baby and Me Booklet".  After visit meds:  Allergies as of 10/23/2017      Reactions   Food Swelling, Other (See Comments)   Pt states that she is allergic to peanut butter.   Reaction:  Tongue swelling       Medication List    STOP taking these medications   aspirin EC 81 MG tablet   hydrALAZINE 10 MG tablet Commonly known as:  APRESOLINE   labetalol 200 MG tablet Commonly known as:  NORMODYNE   NIFEdipine 30 MG 24 hr tablet Commonly known as:  PROCARDIA XL   potassium chloride 10 MEQ tablet Commonly known as:  K-DUR     TAKE these medications   acetaminophen 325 MG tablet Commonly known as:  TYLENOL Take 650 mg by mouth every 6 (six) hours as needed for headache.   amLODipine 10 MG tablet Commonly known as:  NORVASC  Take 1 tablet (10 mg total) by mouth daily. Start taking on:  10/24/2017   ferrous sulfate 325 (65 FE) MG tablet Commonly known as:  FERROUSUL Take 1 tablet (325 mg total) by mouth 2 (two) times daily.   ibuprofen 600 MG tablet Commonly known as:  ADVIL,MOTRIN Take 1 tablet (600 mg total) by mouth every 6 (six) hours.   oxyCODONE-acetaminophen 5-325 MG tablet Commonly known as:  PERCOCET/ROXICET Take 1 tablet by mouth every 4 (four) hours as needed (pain scale 4-7).   PRENATAL GUMMIES/DHA & FA PO Take 1 tablet by mouth daily.   triamterene-hydrochlorothiazide 37.5-25 MG tablet Commonly known as:  MAXZIDE-25 Take 2 tablets by mouth daily. Start taking on:  10/24/2017       Diet: low salt diet  Activity: Advance as tolerated. Pelvic rest for 6 weeks.   Outpatient  follow up: as scheduled Follow up Appt: Future Appointments  Date Time Provider Department Center  10/31/2017  9:15 AM [facility] NURSE [facility]  12/03/2017  2:15 PM Rolm BookbinderMoss, Amber, DO [facility]   Follow up Visit:No Follow-up on file.  Postpartum contraception: Depo Provera and Nexplanon  Newborn Data: Live born female  Birth Weight: 3 lb 7.7 oz (1580 g) APGAR: 8, 10  Newborn Delivery   Birth date/time:  10/20/2017 11:15:00 Delivery type:  C-Section, Low Transverse C-section categorization:  Repeat     Baby Feeding: Bottle and Breast Disposition:NICU   10/23/2017 Catalina AntiguaPeggy Kamrin Sibley, MD

## 2017-10-23 NOTE — Progress Notes (Signed)
Dr. Adrian BlackwaterStinson notified with BP of 106/107. Instructed to give oral BP med now. No IV access at this time. Will recheck BP in 30 minutes. Carmelina DaneERRI L Gisell Buehrle, RN

## 2017-10-23 NOTE — Discharge Instructions (Signed)
Cesarean Delivery, Care After °Refer to this sheet in the next few weeks. These instructions provide you with information about caring for yourself after your procedure. Your health care provider may also give you more specific instructions. Your treatment has been planned according to current medical practices, but problems sometimes occur. Call your health care provider if you have any problems or questions after your procedure. °What can I expect after the procedure? °After the procedure, it is common to have: °· A small amount of blood or clear fluid coming from the incision. °· Some redness, swelling, and pain in your incision area. °· Some abdominal pain and soreness. °· Vaginal bleeding (lochia). °· Pelvic cramps. °· Fatigue. ° °Follow these instructions at home: °Incision care ° °· Follow instructions from your health care provider about how to take care of your incision. Make sure you: °? Wash your hands with soap and water before you change your bandage (dressing). If soap and water are not available, use hand sanitizer. °? If you have a dressing, change it as told by your health care provider. °? Leave stitches (sutures), skin staples, skin glue, or adhesive strips in place. These skin closures may need to stay in place for 2 weeks or longer. If adhesive strip edges start to loosen and curl up, you may trim the loose edges. Do not remove adhesive strips completely unless your health care provider tells you to do that. °· Check your incision area every day for signs of infection. Check for: °? More redness, swelling, or pain. °? More fluid or blood. °? Warmth. °? Pus or a bad smell. °· When you cough or sneeze, hug a pillow. This helps with pain and decreases the chance of your incision opening up (dehiscing). Do this until your incision heals. °Medicines °· Take over-the-counter and prescription medicines only as told by your health care provider. °· If you were prescribed an antibiotic medicine, take it  as told by your health care provider. Do not stop taking the antibiotic until it is finished. °Driving °· Do not drive or operate heavy machinery while taking prescription pain medicine. °Lifestyle °· Do not drink alcohol. This is especially important if you are breastfeeding or taking pain medicine. °· Do not use tobacco products, including cigarettes, chewing tobacco, or e-cigarettes. If you need help quitting, ask your health care provider. Tobacco can delay wound healing. °Eating and drinking °· Drink at least 8 eight-ounce glasses of water every day unless told not to by your health care provider. If you breastfeed, you may need to drink more water than this. °· Eat high-fiber foods every day. These foods may help prevent or relieve constipation. High-fiber foods include: °? Whole grain cereals and breads. °? Brown rice. °? Beans. °? Fresh fruits and vegetables. °Activity °· Return to your normal activities as told by your health care provider. Ask your health care provider what activities are safe for you. °· Rest as much as possible. Try to rest or take a nap while your baby is sleeping. °· Do not lift anything that is heavier than your baby or 10 lb (4.5 kg) as told by your health care provider. °· Ask your health care provider when you can engage in sexual activity. This may depend on your: °? Risk of infection. °? Healing rate. °? Comfort and desire to engage in sexual activity. °Bathing °· Do not take baths, swim, or use a hot tub until your health care provider approves. Ask your health care provider if   you can take showers. You may only be allowed to take sponge baths until your incision heals. °General instructions °· Do not use tampons or douches until your health care provider approves. °· Wear: °? Loose, comfortable clothing. °? A supportive and well-fitting bra. °· Watch for any blood clots that may pass from your vagina. These may look like clumps of dark red, brown, or black discharge. °· Keep  your perineum clean and dry as told by your health care provider. °· Wipe from front to back when you use the toilet. °· If possible, have someone help you care for your baby and help with household activities for a few days after you leave the hospital. °· Keep all follow-up visits for you and your baby as told by your health care provider. This is important. °Contact a health care provider if: °· You have: °? Bad-smelling vaginal discharge. °? Difficulty urinating. °? Pain when urinating. °? A sudden increase or decrease in the frequency of your bowel movements. °? More redness, swelling, or pain around your incision. °? More fluid or blood coming from your incision. °? Pus or a bad smell coming from your incision. °? A fever. °? A rash. °? Little or no interest in activities you used to enjoy. °? Questions about caring for yourself or your baby. °? Nausea. °· Your incision feels warm to the touch. °· Your breasts turn red or become painful or hard. °· You feel unusually sad or worried. °· You vomit. °· You pass large blood clots from your vagina. If you pass a blood clot, save it to show to your health care provider. Do not flush blood clots down the toilet without showing your health care provider. °· You urinate more than usual. °· You are dizzy or light-headed. °· You have not breastfed and have not had a menstrual period for 12 weeks after delivery. °· You stopped breastfeeding and have not had a menstrual period for 12 weeks after stopping breastfeeding. °Get help right away if: °· You have: °? Pain that does not go away or get better with medicine. °? Chest pain. °? Difficulty breathing. °? Blurred vision or spots in your vision. °? Thoughts about hurting yourself or your baby. °? New pain in your abdomen or in one of your legs. °? A severe headache. °· You faint. °· You bleed from your vagina so much that you fill two sanitary pads in one hour. °This information is not intended to replace advice given to  you by your health care provider. Make sure you discuss any questions you have with your health care provider. °Document Released: 04/14/2002 Document Revised: 08/25/2016 Document Reviewed: 06/27/2015 °Elsevier Interactive Patient Education © 2018 [employer] ° ° °Hypertension During Pregnancy °Hypertension is also called high blood pressure. High blood pressure means that the force of your blood moving in your body is too strong. When you are pregnant, this condition should be watched carefully. It can cause problems for you and your baby. °Follow these instructions at home: °Eating and drinking °· Drink enough fluid to keep your pee (urine) clear or pale yellow. °· Eat healthy foods that are low in salt (sodium). °? Do not add salt to your food. °? Check labels on foods and drinks to see much salt is in them. Look on the label where you see "Sodium." °Lifestyle °· Do not use any products that contain nicotine or tobacco, such as cigarettes and e-cigarettes. If you need help quitting, ask your doctor. °·   Do not use alcohol. °· Avoid caffeine. °· Avoid stress. Rest and get plenty of sleep. °General instructions °· Take over-the-counter and prescription medicines only as told by your doctor. °· While lying down, lie on your left side. This keeps pressure off your baby. °· While sitting or lying down, raise (elevate) your feet. Try putting some pillows under your lower legs. °· Exercise regularly. Ask your doctor what kinds of exercise are best for you. °· Keep all prenatal and follow-up visits as told by your doctor. This is important. °Contact a doctor if: °· You have symptoms that your doctor told you to watch for, such as: °? Fever. °? Throwing up (vomiting). °? Headache. °Get help right away if: °· You have very bad pain in your belly (abdomen). °· You are throwing up, and this does not get better with treatment. °· You suddenly get swelling in your hands, ankles, or face. °· You gain 4 lb (1.8 kg) or more in 1  week. °· You get bleeding from your vagina. °· You have blood in your pee. °· You do not feel your baby moving as much as normal. °· You have a change in vision. °· You have muscle twitching or sudden tightening (spasms). °· You have trouble breathing. °· Your lips or fingernails turn blue. °This information is not intended to replace advice given to you by your health care provider. Make sure you discuss any questions you have with your health care provider. °Document Released: 08/25/2010 Document Revised: 04/03/2016 Document Reviewed: 04/03/2016 °Elsevier Interactive Patient Education © 2018 [employer] ° °

## 2017-10-23 NOTE — Lactation Note (Signed)
This note was copied from a baby's chart. Lactation Consultation Note  Patient Name: Eileen Miller WUJWJ'XToday's Date: 10/23/2017  Mom is pumping and hand expressing every 3 hours but not obtaining milk yet.  Mom did not start pumping until 49 hours and also had a blood transfusion so milk may be delayed.  Praise given for following through with plan.  Encouraged to call out for assist prn.   Maternal Data    Feeding Feeding Type: Donor Breast Milk  LATCH Score                   Interventions    Lactation Tools Discussed/Used     Consult Status      Huston FoleyMOULDEN, Gabriella Guile S 10/23/2017, 11:16 AM

## 2017-10-31 ENCOUNTER — Ambulatory Visit: Payer: Medicaid Other

## 2017-11-13 ENCOUNTER — Ambulatory Visit (HOSPITAL_COMMUNITY): Payer: Medicaid Other

## 2017-11-25 ENCOUNTER — Telehealth (HOSPITAL_COMMUNITY): Payer: Self-pay | Admitting: *Deleted

## 2017-11-25 NOTE — Telephone Encounter (Signed)
Preadmission screen  

## 2017-11-28 ENCOUNTER — Telehealth (HOSPITAL_COMMUNITY): Payer: Self-pay | Admitting: *Deleted

## 2017-11-28 NOTE — Telephone Encounter (Signed)
Preadmission screen  

## 2017-11-29 ENCOUNTER — Telehealth (HOSPITAL_COMMUNITY): Payer: Self-pay | Admitting: *Deleted

## 2017-11-29 NOTE — Telephone Encounter (Signed)
Preadmission screen  

## 2017-12-03 ENCOUNTER — Encounter: Payer: Self-pay | Admitting: Advanced Practice Midwife

## 2017-12-03 ENCOUNTER — Ambulatory Visit (INDEPENDENT_AMBULATORY_CARE_PROVIDER_SITE_OTHER): Payer: Medicaid Other | Admitting: Advanced Practice Midwife

## 2017-12-03 VITALS — BP 152/109 | HR 66 | Wt 160.0 lb

## 2017-12-03 DIAGNOSIS — Z1389 Encounter for screening for other disorder: Secondary | ICD-10-CM | POA: Diagnosis not present

## 2017-12-03 DIAGNOSIS — Z3202 Encounter for pregnancy test, result negative: Secondary | ICD-10-CM

## 2017-12-03 DIAGNOSIS — O10919 Unspecified pre-existing hypertension complicating pregnancy, unspecified trimester: Secondary | ICD-10-CM

## 2017-12-03 DIAGNOSIS — Z3042 Encounter for surveillance of injectable contraceptive: Secondary | ICD-10-CM

## 2017-12-03 LAB — POCT PREGNANCY, URINE
PREG TEST UR: NEGATIVE
Preg Test, Ur: NEGATIVE

## 2017-12-03 MED ORDER — MEDROXYPROGESTERONE ACETATE 150 MG/ML IM SUSP
150.0000 mg | Freq: Once | INTRAMUSCULAR | Status: AC
  Administered 2017-12-03: 150 mg via INTRAMUSCULAR

## 2017-12-03 MED ORDER — LISINOPRIL-HYDROCHLOROTHIAZIDE 20-25 MG PO TABS
1.0000 | ORAL_TABLET | Freq: Every day | ORAL | 1 refills | Status: DC
Start: 2017-12-03 — End: 2018-08-22

## 2017-12-03 NOTE — Patient Instructions (Signed)
Primary care follow up  [facility] (will see you even if you do not have sickle cell): [phone] [facility]: [phone] [facility]: [phone]  Make an appointment to be seen in 2 weeks for blood pressure check and medication management

## 2017-12-03 NOTE — Addendum Note (Signed)
Addended by: Dory Larsen A on: 12/03/2017 04:27 PM   Modules accepted: Orders

## 2017-12-03 NOTE — Progress Notes (Signed)
Subjective:     Eileen Miller is a 22 y.o. female who presents for a postpartum visit. She is 7 weeks postpartum following a low cervical transverse Cesarean section. I have fully reviewed the prenatal and intrapartum course. The delivery was at [redacted]weeks gestational weeks. Outcome: repeat cesarean section, low transverse incision. Anesthesia: spinal. Postpartum course has been uncomplicated. Baby's course has been complicated by NICU stay. Baby home now, but was recently readmitted to [facility] with RSV. Baby is feeding by bottle - Similac Neosure. Bleeding no bleeding. Bowel function is normal. Bladder function is normal. Patient is sexually active. Contraception method is Depo-Provera injections. Postpartum depression screening: negative.  The following portions of the patient's history were reviewed and updated as appropriate: allergies, current medications, past family history, past medical history, past social history, past surgical history and problem list.  Review of Systems Pertinent items are noted in HPI.   Objective:    LMP 02/06/2017   General:  alert and cooperative   Breasts:  negative  Lungs: clear to auscultation bilaterally  Heart:  regular rate and rhythm, S1, S2 normal, no murmur, click, rub or gallop  Abdomen: soft, non-tender; bowel sounds normal; no masses,  no organomegaly   Vulva:  not evaluated  Vagina: not evaluated  Cervix:  not examined   Corpus: not examined  Adnexa:  not evaluated  Rectal Exam: Not performed.        Assessment:    Routine postpartum exam. Pap smear not done at today's visit.   Plan:    1. Contraception: Depo-Provera injections 2. Consult with Dr. Adrian Blackwater will stop Maxzide and switch to lisinopril/HCTZ and have patient FU with PCP  3. Follow up in: 2 weeks with PCP  or as needed.

## 2017-12-06 ENCOUNTER — Encounter (HOSPITAL_COMMUNITY)
Admission: RE | Admit: 2017-12-06 | Discharge: 2017-12-06 | Disposition: A | Discharge status: Home / Self Care | Payer: Medicaid Other | Source: Ambulatory Visit | Attending: Obstetrics & Gynecology | Admitting: Obstetrics & Gynecology

## 2017-12-08 ENCOUNTER — Inpatient Hospital Stay (HOSPITAL_COMMUNITY)
Admission: RE | Admit: 2017-12-08 | Payer: Medicaid Other | Source: Ambulatory Visit | Admitting: Obstetrics & Gynecology

## 2017-12-08 SURGERY — Surgical Case
Anesthesia: Regional

## 2017-12-15 ENCOUNTER — Encounter (HOSPITAL_COMMUNITY): Payer: Self-pay | Admitting: Obstetrics & Gynecology

## 2017-12-27 IMAGING — US US MFM OB DETAIL+14 WK
1 series · 14 of 28 positions shown · non-contrast
Comparison: none

[Series 1: us mfm ob detail+14 wk · 97 acquisitions, 14 frames shown]
[im 4/97]
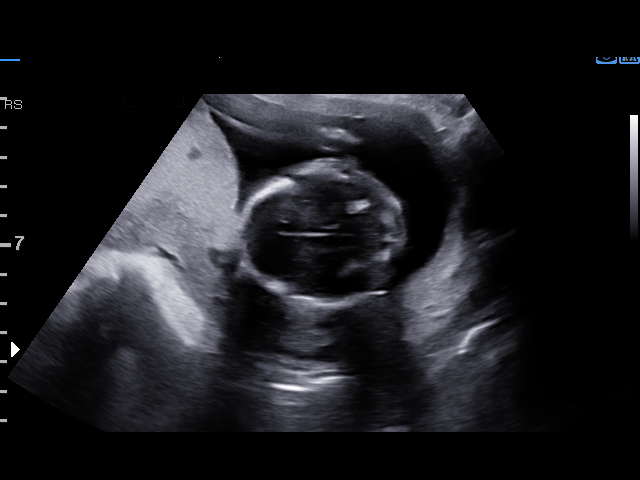
[im 11/97]
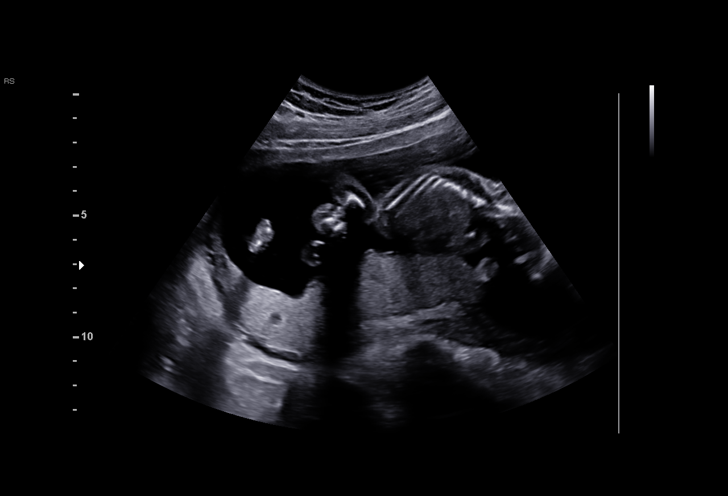
[im 18/97]
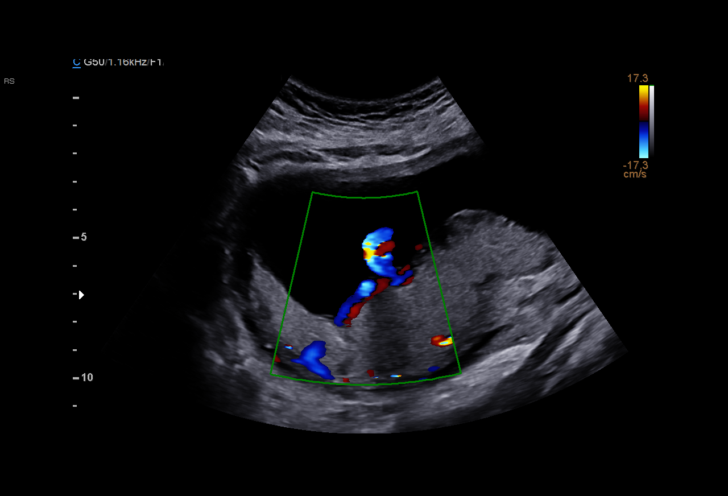
[im 25/97]
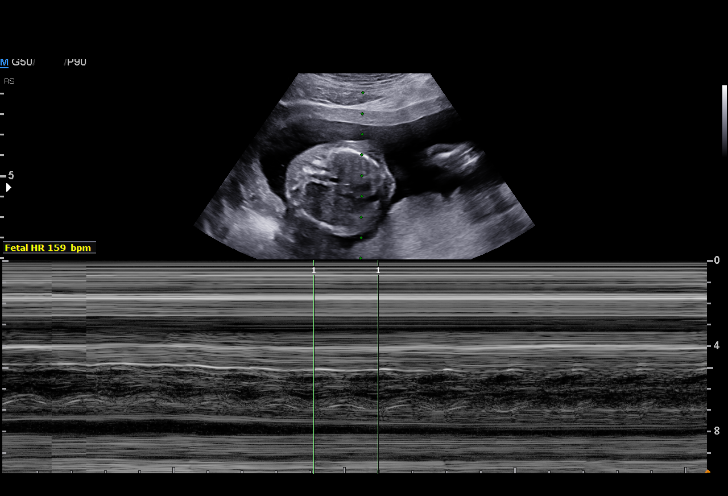
[im 33/97]
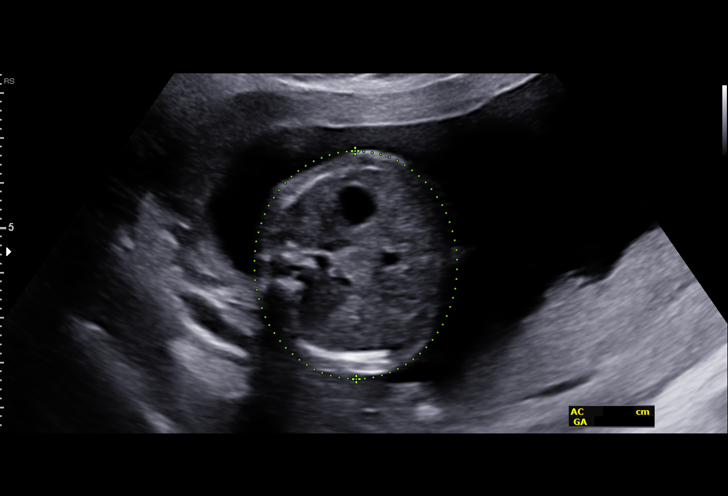
[im 40/97]
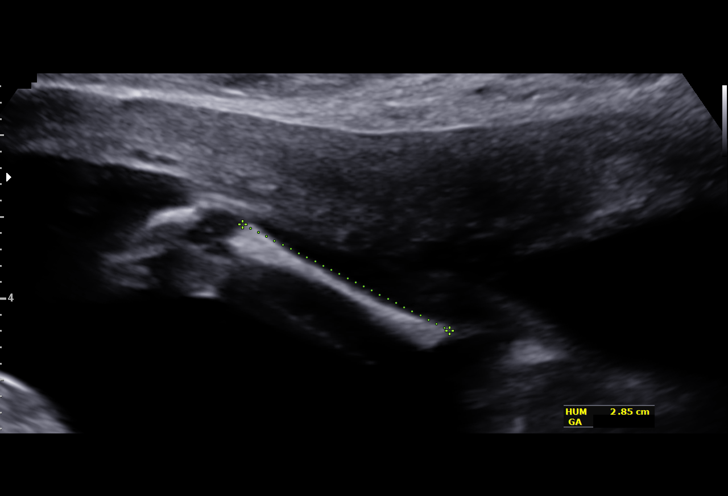
[im 47/97]
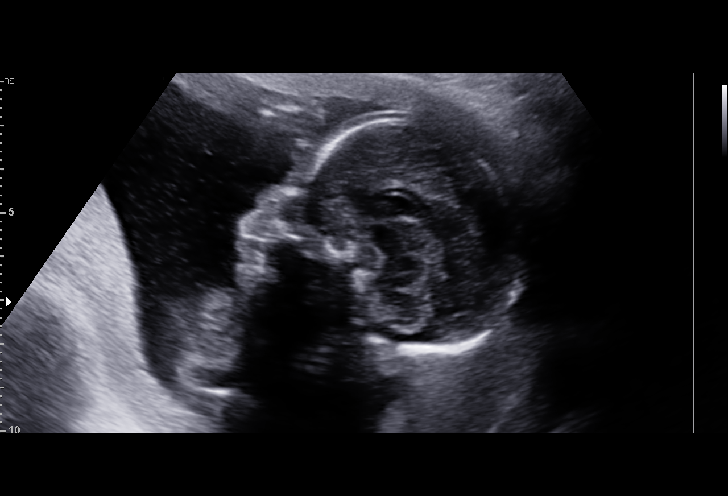
[im 54/97]
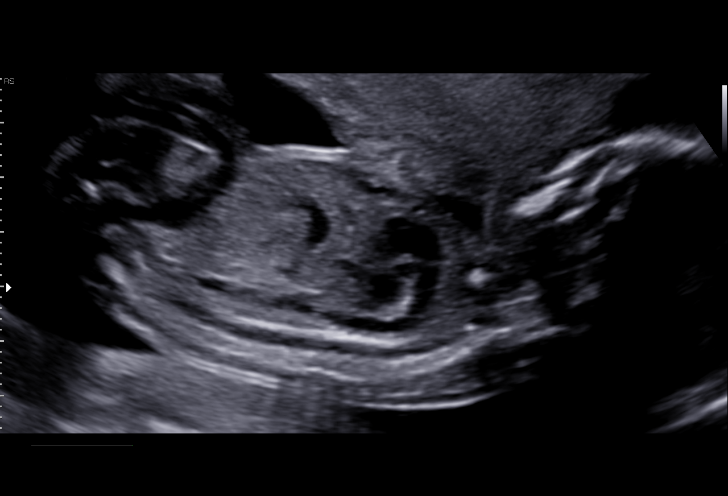
[im 61/97]
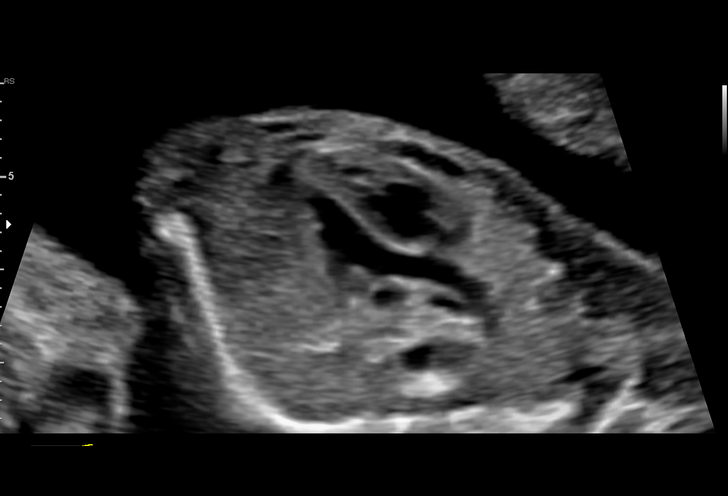
[im 68/97]
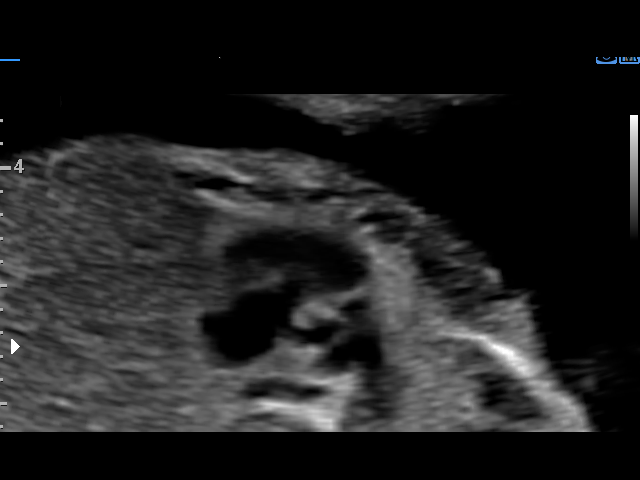
[im 75/97]
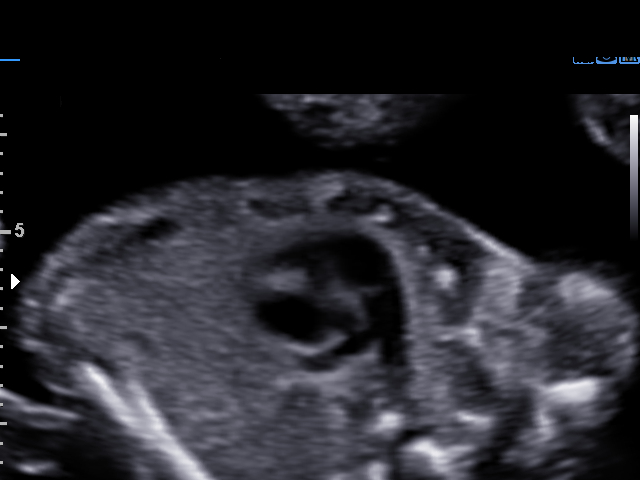
[im 82/97]
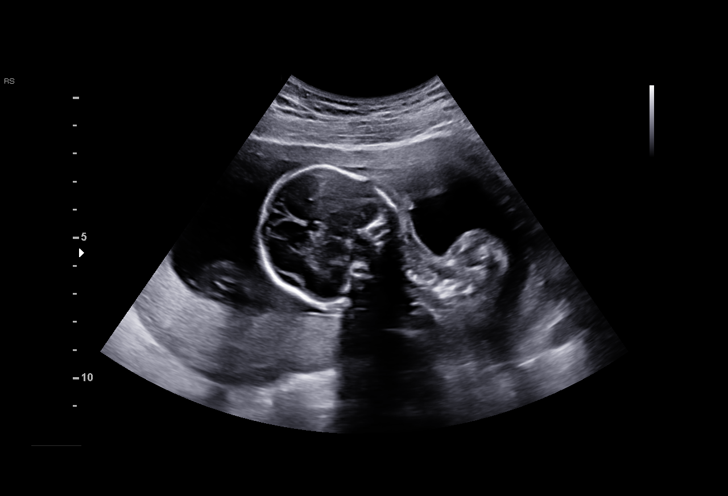
[im 89/97]
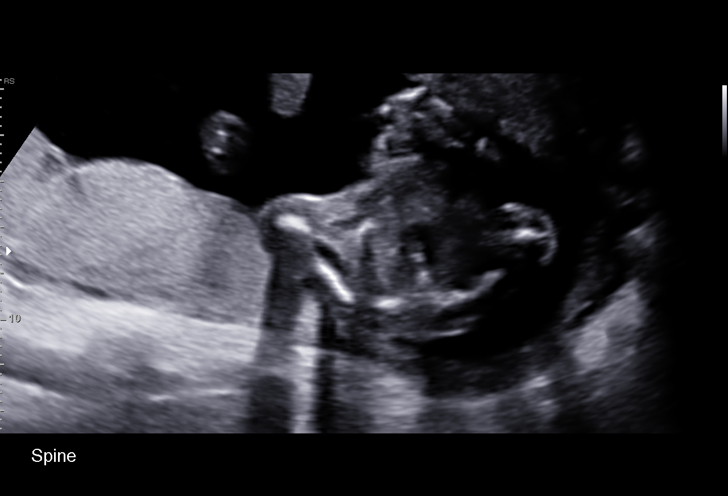
[im 97/97]
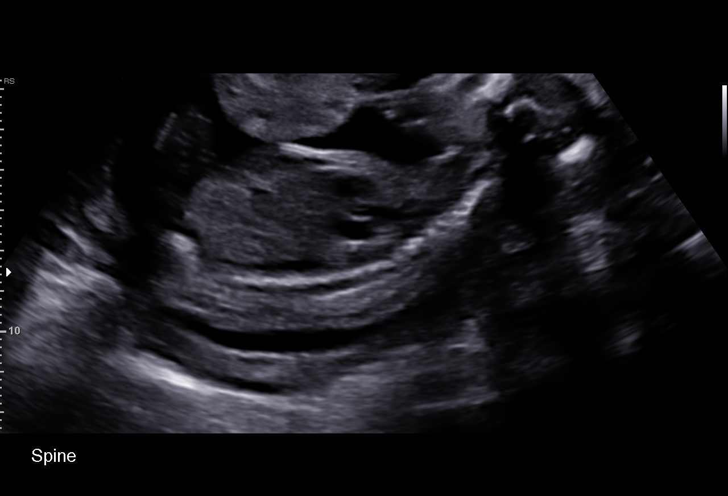

[14 of 28 positions shown; findings below may reference images not displayed]

[REDACTED]. [HOSPITAL],

1  LEOIVEZIC HITI             [id number]      [id number]     [id number]
Indications

19 weeks gestation of pregnancy
Encounter for antenatal screening for
malformations
Hypertension - Chronic/Pre-existing
History of cesarean delivery, currently
pregnant
Poor obstetric history: Previous preterm
delivery, antepartum (@95wks)
OB History

Gravidity:    2         Prem:   1
Living:       1
Fetal Evaluation

Num Of Fetuses:     1
Fetal Heart         159
Rate(bpm):
Cardiac Activity:   Observed
Presentation:       Cephalic
Placenta:           Posterior, above cervical os
P. Cord Insertion:  Visualized, central

Amniotic Fluid
AFI FV:      Subjectively within normal limits

Largest Pocket(cm)
7.45
Biometry
BPD:      47.5  mm     G. Age:  20w 2d         71  %    CI:        74.33   %    70 - 86
FL/HC:      16.7   %    16.8 -
HC:      174.9  mm     G. Age:  20w 0d         50  %    HC/AC:      1.16        1.09 -
AC:      151.3  mm     G. Age:  20w 2d         61  %    FL/BPD:     61.5   %
FL:       29.2  mm     G. Age:  19w 0d         16  %    FL/AC:      19.3   %    20 - 24
HUM:      28.7  mm     G. Age:  19w 2d         39  %

Est. FW:     312  gm    0 lb 11 oz      47  %
Gestational Age

LMP:           24w 0d        Date:  02/06/17                 EDD:   11/13/17
U/S Today:     19w 6d                                        EDD:   12/12/17
Best:          19w 6d     Det. By:  U/S C R L  (06/10/17)    EDD:   12/12/17
Anatomy

Cranium:               Appears normal         Aortic Arch:            Appears normal
Cavum:                 Appears normal         Ductal Arch:            Appears normal
Ventricles:            Appears normal         Diaphragm:              Appears normal
Choroid Plexus:        Appears normal         Stomach:                Appears normal, left
sided
Cerebellum:            Appears normal         Abdomen:                Appears normal
Posterior Fossa:       Appears normal         Abdominal Wall:         Appears nml (cord
insert, abd wall)
Nuchal Fold:           Appears normal         Cord Vessels:           Appears normal (3
vessel cord)
Face:                  Appears normal         Kidneys:                Appear normal
(orbits and profile)
Lips:                  Appears normal         Bladder:                Appears normal
Thoracic:              Appears normal         Spine:                  Appears normal
Heart:                 Appears normal         Upper Extremities:      Appears normal
(4CH, axis, and situs
RVOT:                  Appears normal         Lower Extremities:      Appears normal
LVOT:                  Appears normal

Other:  Fetus appears to be a female. Heels visualized. Technically difficult
due to fetal position. Left 5th digit visualized.
Cervix Uterus Adnexa

Cervix
Length:            3.5  cm.
Normal appearance by transabdominal scan.

Uterus
No abnormality visualized.

Left Ovary
Within normal limits.

Right Ovary
Within normal limits.

Adnexa:       No abnormality visualized. No adnexal mass
visualized.
Impression

Singleton intrauterine pregnancy at 19+6 weeks
Review of the anatomy shows no sonographic markers for
aneuploidy or structural anomalies
Amniotic fluid volume is normal
Estimated fetal weight is 312g which is growth in the 47th
percentile
Recommendations

Repeat scan in 4 weeks due to CHTN

## 2018-02-18 ENCOUNTER — Ambulatory Visit: Payer: Self-pay

## 2018-08-21 ENCOUNTER — Emergency Department (HOSPITAL_COMMUNITY): Payer: Self-pay

## 2018-08-21 ENCOUNTER — Observation Stay (HOSPITAL_COMMUNITY): Payer: Self-pay

## 2018-08-21 ENCOUNTER — Other Ambulatory Visit: Payer: Self-pay

## 2018-08-21 ENCOUNTER — Encounter (HOSPITAL_COMMUNITY): Payer: Self-pay

## 2018-08-21 ENCOUNTER — Inpatient Hospital Stay (HOSPITAL_COMMUNITY)
Admission: EM | Admit: 2018-08-21 | Discharge: 2018-08-22 | DRG: 812 | Disposition: A | Discharge status: Home / Self Care | Payer: Self-pay | Attending: Internal Medicine | Admitting: Internal Medicine

## 2018-08-21 DIAGNOSIS — Z8249 Family history of ischemic heart disease and other diseases of the circulatory system: Secondary | ICD-10-CM

## 2018-08-21 DIAGNOSIS — T465X5A Adverse effect of other antihypertensive drugs, initial encounter: Secondary | ICD-10-CM | POA: Diagnosis present

## 2018-08-21 DIAGNOSIS — Z98891 History of uterine scar from previous surgery: Secondary | ICD-10-CM

## 2018-08-21 DIAGNOSIS — Z832 Family history of diseases of the blood and blood-forming organs and certain disorders involving the immune mechanism: Secondary | ICD-10-CM

## 2018-08-21 DIAGNOSIS — E876 Hypokalemia: Secondary | ICD-10-CM | POA: Diagnosis present

## 2018-08-21 DIAGNOSIS — E875 Hyperkalemia: Secondary | ICD-10-CM | POA: Diagnosis present

## 2018-08-21 DIAGNOSIS — D500 Iron deficiency anemia secondary to blood loss (chronic): Principal | ICD-10-CM

## 2018-08-21 DIAGNOSIS — Z79899 Other long term (current) drug therapy: Secondary | ICD-10-CM

## 2018-08-21 DIAGNOSIS — X58XXXA Exposure to other specified factors, initial encounter: Secondary | ICD-10-CM | POA: Diagnosis present

## 2018-08-21 DIAGNOSIS — D649 Anemia, unspecified: Secondary | ICD-10-CM | POA: Diagnosis present

## 2018-08-21 DIAGNOSIS — Z91018 Allergy to other foods: Secondary | ICD-10-CM

## 2018-08-21 DIAGNOSIS — N920 Excessive and frequent menstruation with regular cycle: Secondary | ICD-10-CM | POA: Diagnosis present

## 2018-08-21 DIAGNOSIS — D508 Other iron deficiency anemias: Secondary | ICD-10-CM

## 2018-08-21 DIAGNOSIS — I10 Essential (primary) hypertension: Secondary | ICD-10-CM | POA: Diagnosis present

## 2018-08-21 HISTORY — DX: Iron deficiency anemia secondary to blood loss (chronic): D50.0

## 2018-08-21 LAB — BASIC METABOLIC PANEL
Anion gap: 8 (ref 5–15)
BUN: 12 mg/dL (ref 6–20)
CO2: 25 mmol/L (ref 22–32)
Calcium: 8.6 mg/dL — ABNORMAL LOW (ref 8.9–10.3)
Chloride: 107 mmol/L (ref 98–111)
Creatinine, Ser: 0.64 mg/dL (ref 0.44–1.00)
GFR calc Af Amer: >60 mL/min (ref >60)
GFR calc non Af Amer: >60 mL/min (ref >60)
GLUCOSE: 87 mg/dL (ref 70–99)
POTASSIUM: 2.7 mmol/L — AB (ref 3.5–5.1)
Sodium: 140 mmol/L (ref 135–145)

## 2018-08-21 LAB — CBC
HCT: 14.9 % — ABNORMAL LOW (ref 36.0–46.0)
HEMOGLOBIN: 3.8 g/dL — AB (ref 12.0–15.0)
MCH: 17.8 pg — ABNORMAL LOW (ref 26.0–34.0)
MCHC: 25.5 g/dL — AB (ref 30.0–36.0)
MCV: 69.6 fL — ABNORMAL LOW (ref 80.0–100.0)
Platelets: 282 10*3/uL (ref 150–400)
RBC: 2.14 MIL/uL — ABNORMAL LOW (ref 3.87–5.11)
RDW: 18.7 % — ABNORMAL HIGH (ref 11.5–15.5)
WBC: 3.2 10*3/uL — ABNORMAL LOW (ref 4.0–10.5)
nRBC: 0 % (ref 0.0–0.2)

## 2018-08-21 LAB — I-STAT BETA HCG BLOOD, ED (NOT ORDERABLE): I-stat hCG, quantitative: 13.4 m[IU]/mL — ABNORMAL HIGH (ref <5)

## 2018-08-21 LAB — D-DIMER, QUANTITATIVE (NOT AT ARMC): D DIMER QUANT: 2.14 ug{FEU}/mL — AB (ref 0.00–0.50)

## 2018-08-21 LAB — HCG, QUANTITATIVE, PREGNANCY: hCG, Beta Chain, Quant, S: 14 m[IU]/mL — ABNORMAL HIGH (ref <5)

## 2018-08-21 LAB — PREPARE RBC (CROSSMATCH)

## 2018-08-21 MED ORDER — POTASSIUM CHLORIDE CRYS ER 20 MEQ PO TBCR
40.0000 meq | EXTENDED_RELEASE_TABLET | Freq: Once | ORAL | Status: AC
  Administered 2018-08-21: 40 meq via ORAL
  Filled 2018-08-21: qty 2

## 2018-08-21 MED ORDER — IOPAMIDOL (ISOVUE-370) INJECTION 76%
INTRAVENOUS | Status: AC
  Filled 2018-08-21: qty 100

## 2018-08-21 MED ORDER — ONDANSETRON HCL 4 MG PO TABS: 4.0000 mg | ORAL_TABLET | Freq: Four times a day (QID) | ORAL | Status: DC | PRN

## 2018-08-21 MED ORDER — ACETAMINOPHEN 325 MG PO TABS
650.0000 mg | ORAL_TABLET | Freq: Four times a day (QID) | ORAL | Status: DC | PRN
  Filled 2018-08-21: qty 2

## 2018-08-21 MED ORDER — ACETAMINOPHEN 650 MG RE SUPP: 650.0000 mg | Freq: Four times a day (QID) | RECTAL | Status: DC | PRN

## 2018-08-21 MED ORDER — ONDANSETRON HCL 4 MG/2ML IJ SOLN: 4.0000 mg | Freq: Four times a day (QID) | INTRAMUSCULAR | Status: DC | PRN

## 2018-08-21 MED ORDER — SODIUM CHLORIDE (PF) 0.9 % IJ SOLN
INTRAMUSCULAR | Status: AC
  Filled 2018-08-21: qty 50

## 2018-08-21 MED ORDER — AMLODIPINE BESYLATE 10 MG PO TABS
10.0000 mg | ORAL_TABLET | Freq: Every day | ORAL | Status: DC
  Administered 2018-08-21 – 2018-08-22 (×2): 10 mg via ORAL
  Filled 2018-08-21 (×2): qty 1

## 2018-08-21 MED ORDER — IOPAMIDOL (ISOVUE-370) INJECTION 76%
100.0000 mL | Freq: Once | INTRAVENOUS | Status: AC | PRN
  Administered 2018-08-21: 100 mL via INTRAVENOUS

## 2018-08-21 MED ORDER — SODIUM CHLORIDE 0.9% FLUSH: 3.0000 mL | INTRAVENOUS | Status: DC | PRN

## 2018-08-21 MED ORDER — SODIUM CHLORIDE 0.9 % IV SOLN: 250.0000 mL | INTRAVENOUS | Status: DC | PRN

## 2018-08-21 MED ORDER — SODIUM CHLORIDE 0.9 % IV SOLN
10.0000 mL/h | Freq: Once | INTRAVENOUS | Status: AC
  Administered 2018-08-21: 10 mL/h via INTRAVENOUS

## 2018-08-21 MED ORDER — SODIUM CHLORIDE 0.9% FLUSH
3.0000 mL | Freq: Two times a day (BID) | INTRAVENOUS | Status: DC
  Administered 2018-08-21 – 2018-08-22 (×2): 3 mL via INTRAVENOUS

## 2018-08-21 MED ORDER — AMLODIPINE BESYLATE 10 MG PO TABS
10.0000 mg | ORAL_TABLET | Freq: Every day | ORAL | Status: DC
  Filled 2018-08-21: qty 1

## 2018-08-21 MED ORDER — POLYETHYLENE GLYCOL 3350 17 G PO PACK: 17.0000 g | PACK | Freq: Every day | ORAL | Status: DC | PRN

## 2018-08-21 NOTE — Progress Notes (Addendum)
CRITICAL VALUE ALERT  Critical Value:  D-dimer 2.14  Date & Time Notied:  08-21-2018 2030  Provider Notified: X. Blount  Orders Received/Actions taken:

## 2018-08-21 NOTE — ED Notes (Addendum)
NM Perf will not be able to be done tonight. Paged Linton FlemingsX. Blount about possible doing a CT Angio. Blount d/c'd NM Perf and ordered CT Angio. Notified Rose, 4th floor RN.

## 2018-08-21 NOTE — ED Provider Notes (Signed)
[facility]-EMERGENCY DEPT Provider Note   CSN: [id number] Arrival date & time: 08/21/18  0846     History   Chief Complaint Chief Complaint  Patient presents with  . Shortness of Breath  . Hypertension    HPI Eileen Miller is a 23 y.o. female.  Patient with 3-week history of increasing shortness of breath particularly with exertion.  Also feeling a little lightheaded no passing out.  Also feels pale.  Patient status post's C-section delivery in the spring.  Required blood transfusion at that time.  Patient also with a recent elective abortion December 13 that was done with methotrexate.  There was no surgical procedure.  The patient had vaginal bleeding since that time up until 2 days ago.  Now is resolved.  No pelvic or abdominal pain.     Past Medical History:  Diagnosis Date  . History of cesarean section 03/03/2016   For severe Preeclampsia. Did not labor.   . History of preterm delivery 03/03/2016   For severe Preeclampsia. Did not labor.   . Hypertension   . Severe preeclampsia 2015    Patient Active Problem List   Diagnosis Date Noted  . S/P C-section 10/20/2017  . Hypokalemia 10/17/2017  . Chronic hypertension with superimposed severe preeclampsia 10/16/2017  . Anemia 05/20/2017  . Supervision of high risk pregnancy, antepartum 04/30/2017  . Chronic hypertension during pregnancy, antepartum 04/08/2017  . History of cesarean section 03/03/2016  . History of preterm delivery 03/03/2016  . HTN (hypertension) 05/19/2012    Past Surgical History:  Procedure Laterality Date  . CESAREAN SECTION    . CESAREAN SECTION N/A 10/20/2017   Procedure: CESAREAN SECTION;  Surgeon: Lazaro ArmsEure, Luther H, MD;  [facility];  Service: Obstetrics;  Laterality: N/A;  . EYE SURGERY    . HERNIA REPAIR       OB History    Gravida  2   Para  2   Term      Preterm  2   AB      Living  2     SAB      TAB      Ectopic      Multiple    0   Live Births  2            Home Medications    Prior to Admission medications   Medication Sig Start Date End Date Taking? Authorizing Provider  lisinopril-hydrochlorothiazide (PRINZIDE,ZESTORETIC) 20-25 MG tablet Take 1 tablet by mouth daily. 12/03/17  Yes Thressa ShellerHogan, Heather D, CNM  amLODipine (NORVASC) 10 MG tablet Take 1 tablet (10 mg total) by mouth daily. Patient not taking: Reported on 08/21/2018 10/24/17   Constant, Peggy, MD  ferrous sulfate (FERROUSUL) 325 (65 FE) MG tablet Take 1 tablet (325 mg total) by mouth 2 (two) times daily. Patient not taking: Reported on 10/16/2017 05/20/17   Levie HeritageStinson, Jacob J, DO  ibuprofen (ADVIL,MOTRIN) 600 MG tablet Take 1 tablet (600 mg total) by mouth every 6 (six) hours. Patient not taking: Reported on 12/03/2017 10/23/17   Constant, Peggy, MD  oxyCODONE-acetaminophen (PERCOCET/ROXICET) 5-325 MG tablet Take 1 tablet by mouth every 4 (four) hours as needed (pain scale 4-7). Patient not taking: Reported on 12/03/2017 10/23/17   Constant, Peggy, MD  triamterene-hydrochlorothiazide (MAXZIDE-25) 37.5-25 MG tablet Take 2 tablets by mouth daily. Patient not taking: Reported on 08/21/2018 10/24/17   Constant, Peggy, MD    Family History Family History  Problem Relation Age of Onset  .  Hypertension Father   . Hypertension Maternal Grandmother   . Hypertension Paternal Grandmother     Social History Social History   Tobacco Use  . Smoking status: Never Smoker  . Smokeless tobacco: Never Used  Substance Use Topics  . Alcohol use: No  . Drug use: No     Allergies   Food   Review of Systems Review of Systems  Constitutional: Negative for chills and fever.  HENT: Negative for congestion, rhinorrhea and sore throat.   Eyes: Negative for visual disturbance.  Respiratory: Negative for cough and shortness of breath.   Cardiovascular: Negative for chest pain and leg swelling.  Gastrointestinal: Negative for abdominal pain, diarrhea, nausea and  vomiting.  Genitourinary: Positive for vaginal bleeding. Negative for dysuria.  Musculoskeletal: Negative for back pain and neck pain.  Skin: Negative for rash.  Neurological: Negative for dizziness, syncope, light-headedness and headaches.  Hematological: Does not bruise/bleed easily.  Psychiatric/Behavioral: Negative for confusion.     Physical Exam Updated Vital Signs BP (!) 153/110   Pulse 82   Temp (!) 97.1 F (36.2 C) (Oral)   Resp (!) 21   Ht 1.702 m (5\' 7" )   Wt 79.4 kg   LMP 08/17/2018 (Approximate)   SpO2 100%   BMI 27.41 kg/m   Physical Exam Vitals signs and nursing note reviewed.  Constitutional:      General: She is not in acute distress.    Appearance: She is well-developed.  HENT:     Head: Normocephalic and atraumatic.     Mouth/Throat:     Mouth: Mucous membranes are moist.  Eyes:     Conjunctiva/sclera: Conjunctivae normal.  Neck:     Musculoskeletal: Normal range of motion and neck supple.  Cardiovascular:     Rate and Rhythm: Normal rate and regular rhythm.     Heart sounds: No murmur.  Pulmonary:     Effort: Pulmonary effort is normal. No respiratory distress.     Breath sounds: Normal breath sounds.  Abdominal:     General: Bowel sounds are normal.     Palpations: Abdomen is soft.     Tenderness: There is no abdominal tenderness.  Musculoskeletal: Normal range of motion.     Right lower leg: No edema.     Left lower leg: No edema.  Skin:    General: Skin is warm and dry.     Coloration: Skin is pale.     Findings: No rash.  Neurological:     General: No focal deficit present.     Mental Status: She is alert and oriented to person, place, and time.      ED Treatments / Results  Labs (all labs ordered are listed, but only abnormal results are displayed) Labs Reviewed  CBC - Abnormal; Notable for the following components:      Result Value   WBC 3.2 (*)    RBC 2.14 (*)    Hemoglobin 3.8 (*)    HCT 14.9 (*)    MCV 69.6 (*)     MCH 17.8 (*)    MCHC 25.5 (*)    RDW 18.7 (*)    All other components within normal limits  BASIC METABOLIC PANEL - Abnormal; Notable for the following components:   Potassium 2.7 (*)    Calcium 8.6 (*)    All other components within normal limits  HCG, QUANTITATIVE, PREGNANCY - Abnormal; Notable for the following components:   hCG, Beta Chain, Quant, S 14 (*)  All other components within normal limits  I-STAT BETA HCG BLOOD, ED (NOT ORDERABLE) - Abnormal; Notable for the following components:   I-stat hCG, quantitative 13.4 (*)    All other components within normal limits  D-DIMER, QUANTITATIVE (NOT AT [facility])  I-STAT BETA HCG BLOOD, ED ([facility], [facility], [facility] ONLY)  TYPE AND SCREEN  PREPARE RBC (CROSSMATCH)    EKG EKG Interpretation  Date/Time:  Thursday August 21 2018 08:56:16 EST Ventricular Rate:  76 PR Interval:    QRS Duration: 81 QT Interval:  407 QTC Calculation: 458 R Axis:   83 Text Interpretation:  Sinus rhythm Borderline T abnormalities, anterior leads Confirmed by Vanetta Mulders [id number]) on 08/21/2018 11:17:43 AM   Radiology Dg Chest 2 View  Result Date: 08/21/2018 CLINICAL DATA:  Shortness of breath for 2 weeks. EXAM: CHEST - 2 VIEW COMPARISON:  None. FINDINGS: The heart size and mediastinal contours are within normal limits. Both lungs are clear. There is scoliosis of spine. IMPRESSION: No active cardiopulmonary disease. Electronically Signed   By: Sherian Rein M.D.   On: 08/21/2018 09:24    Procedures Procedures (including critical care time)  CRITICAL CARE Performed by: Vanetta Mulders Total critical care time: 30 minutes Critical care time was exclusive of separately billable procedures and treating other patients. Critical care was necessary to treat or prevent imminent or life-threatening deterioration. Critical care was time spent personally by me on the following activities: development of treatment plan with patient and/or surrogate as well as nursing,  discussions with consultants, evaluation of patient's response to treatment, examination of patient, obtaining history from patient or surrogate, ordering and performing treatments and interventions, ordering and review of laboratory studies, ordering and review of radiographic studies, pulse oximetry and re-evaluation of patient's condition.   Medications Ordered in ED Medications  potassium chloride SA (K-DUR,KLOR-CON) CR tablet 40 mEq (has no administration in time range)  0.9 %  sodium chloride infusion (has no administration in time range)     Initial Impression / Assessment and Plan / ED Course  I have reviewed the triage vital signs and the nursing notes.  Pertinent labs & imaging results that were available during my care of the patient were reviewed by me and considered in my medical decision making (see chart for details).    Work-up for the patient's complaints of increased shortness of breath and some fatigue.  States she could not really walk to the parking lot without getting very short of breath.  States been ongoing for about 3 weeks.  Patient also reported recent elective abortion with methotrexate.  This was December 13.  She has had some vaginal bleeding up until about 2 days ago.  At times it was heavy but they told her that would be normal.  Patient also has a known history of hypertension.  Is worried about her blood pressures being elevated.  She arrived here with pressure of 159/118.  Patient is taking her blood pressure medications.  Work-up showed that she had significant anemia.  With a hemoglobin in the 3 range.  For this patient is to receive initially 2 units of blood.  Transfusion started.  Chest x-ray without any acute findings.  EKG without acute abnormalities.  Patient's i-STAT hCG was slightly elevated quantitative hCG was slightly elevated but most likely is coming down from the recent elective abortion.  Patient's potassium was also low at 2.7 patient received  oral potassium for that 40 mEq.  Hospitalist contacted for admission and continuing the blood transfusion.  D-dimer was ordered but is not back yet.   Final Clinical Impressions(s) / ED Diagnoses   Final diagnoses:  Essential hypertension  Anemia, unspecified type  Hypokalemia    ED Discharge Orders    None       Vanetta Mulders, MD 08/21/18 1735

## 2018-08-21 NOTE — ED Notes (Signed)
ED TO INPATIENT HANDOFF REPORT  ED Nurse Name and Phone #: Twylia Oka [phone]  Name/Age/Gender Eileen Miller 23 y.o. female Room/Bed: WA03/WA03  Code Status   Code Status: Prior  Home/SNF/Other Home Patient oriented to: self, place, time and situation Is this baseline? Yes   Triage Complete: Triage complete  Chief Complaint BP Issues; SOB  Triage Note Patient reports that she has had increased SOB when she walks down a flight of step and not as much when she walks up the steps x 3 weeks.  Patient had an abortion 07/18/18.   Allergies Allergies  Allergen Reactions  . Food Swelling and Other (See Comments)    Peanut butter causes tongue swelling     Level of Care/Admitting Diagnosis ED Disposition    ED Disposition Condition Comment   Admit  [facility][id number]  Level of Care: Telemetry [5]  Admit to tele based on following criteria: Other see comments  Comments: hypoK  Diagnosis: Symptomatic anemia [[id number]][[id number]]  Admitting Physician: Delaine LamePUROHIT, SHREY C [[id number]]  Attending Physician: Delaine LamePUROHIT, SHREY C [[id number]]  PT Class (Do Not Modify): Observation [104]  PT Acc Code (Do Not Modify): Observation [10022]       Medical/Surgery History Past Medical History:  Diagnosis Date  . History of cesarean section 03/03/2016   For severe Preeclampsia. Did not labor.   . History of preterm delivery 03/03/2016   For severe Preeclampsia. Did not labor.   . Hypertension   . Iron deficiency anemia due to chronic blood loss 08/21/2018  . Severe preeclampsia 2015   Past Surgical History:  Procedure Laterality Date  . CESAREAN SECTION    . CESAREAN SECTION N/A 10/20/2017   Procedure: CESAREAN SECTION;  Surgeon: Lazaro ArmsEure, Luther H, MD;  [facility];  Service: Obstetrics;  Laterality: N/A;  . EYE SURGERY    . HERNIA REPAIR       IV Location/Drains/Wounds Patient Lines/Drains/Airways Status   Active Line/Drains/Airways    Name:    Placement date:   Placement time:   Site:   Days:   Peripheral IV 08/21/18 Left Antecubital   08/21/18    1522    Antecubital   less than 1   Incision (Closed) 10/20/17 Abdomen   10/20/17    1146     305   Incision (Closed) 10/20/17 Vagina   10/20/17    1146     305          Intake/Output Last 24 hours  Intake/Output Summary (Last 24 hours) at 08/21/2018 2102 Last data filed at 08/21/2018 2022 Gross per 24 hour  Intake 630 ml  Output -  Net 630 ml    Labs/Imaging Results for orders placed or performed during the hospital encounter of 08/21/18 (from the past 48 hour(s))  CBC     Status: Abnormal   Collection Time: 08/21/18  1:04 PM  Result Value Ref Range   WBC 3.2 (L) 4.0 - 10.5 K/uL   RBC 2.14 (L) 3.87 - 5.11 MIL/uL   Hemoglobin 3.8 (LL) 12.0 - 15.0 g/dL    Comment: Reticulocyte Hemoglobin testing may be clinically indicated, consider ordering this additional test [id number] THIS CRITICAL RESULT HAS VERIFIED AND BEEN CALLED TO BEAULAURIER,E. RN BY KATHLEEN COHEN ON 01 16 2020 AT 1431, AND HAS BEEN READ BACK. CRITICAL RESULT VERIFIED    HCT 14.9 (L) 36.0 - 46.0 %   MCV 69.6 (L) 80.0 - 100.0 fL   MCH 17.8 (L) 26.0 - 34.0 pg  MCHC 25.5 (L) 30.0 - 36.0 g/dL   RDW 32.0 (H) 23.3 - 43.5 %   Platelets 282 150 - 400 K/uL   nRBC 0.0 0.0 - 0.2 %    Comment: Performed at [facility], [address] [place], [place] [address]  Basic metabolic panel     Status: Abnormal   Collection Time: 08/21/18  1:04 PM  Result Value Ref Range   Sodium 140 135 - 145 mmol/L   Potassium 2.7 (LL) 3.5 - 5.1 mmol/L    Comment: REPEATED TO VERIFY CRITICAL RESULT CALLED TO, READ BACK BY AND VERIFIED WITH: INMAN,R. RN AT 1340 08/21/18 MULLINS,T    Chloride 107 98 - 111 mmol/L   CO2 25 22 - 32 mmol/L   Glucose, Bld 87 70 - 99 mg/dL   BUN 12 6 - 20 mg/dL   Creatinine, Ser 8.37 0.44 - 1.00 mg/dL   Calcium 8.6 (L) 8.9 - 10.3 mg/dL   GFR calc non Af Amer >60 >60 mL/min   GFR calc  Af Amer >60 >60 mL/min   Anion gap 8 5 - 15    Comment: Performed at [facility], [address] [place], [place] [address]  hCG, quantitative, pregnancy     Status: Abnormal   Collection Time: 08/21/18  1:04 PM  Result Value Ref Range   hCG, Beta Chain, Quant, S 14 (H) <5 mIU/mL    Comment:          GEST. AGE      CONC.  (mIU/mL)   <=1 WEEK        5 - 50     2 WEEKS       50 - 500     3 WEEKS       100 - 10,000     4 WEEKS     1,000 - 30,000     5 WEEKS     3,500 - 115,000   6-8 WEEKS     12,000 - 270,000    12 WEEKS     15,000 - 220,000        FEMALE AND NON-PREGNANT FEMALE:     LESS THAN 5 mIU/mL Performed at [facility], [address] [place], [place] [address]   I-Stat beta hCG blood, ED     Status: Abnormal   Collection Time: 08/21/18  1:38 PM  Result Value Ref Range   I-stat hCG, quantitative 13.4 (H) <5 mIU/mL   Comment 3            Comment:   GEST. AGE      CONC.  (mIU/mL)   <=1 WEEK        5 - 50     2 WEEKS       50 - 500     3 WEEKS       100 - 10,000     4 WEEKS     1,000 - 30,000        FEMALE AND NON-PREGNANT FEMALE:     LESS THAN 5 mIU/mL   D-dimer, quantitative (not at [facility])     Status: Abnormal   Collection Time: 08/21/18  3:25 PM  Result Value Ref Range   D-Dimer, Quant 2.14 (H) 0.00 - 0.50 ug/mL-FEU    Comment: (NOTE) At the manufacturer cut-off of 0.50 ug/mL FEU, this assay has been documented to exclude PE with a sensitivity and negative predictive value of 97 to 99%.  At  this time, this assay has not been approved by the FDA to exclude DVT/VTE. Results should be correlated with clinical presentation. Performed at [facility], [address], [place], [place] [address]   Type and screen [facility]     Status: None (Preliminary result)   Collection Time: 08/21/18  3:25 PM  Result Value Ref Range   ABO/RH(D) O POS    Antibody Screen NEG    Sample Expiration  08/24/2018    Unit Number [id number]    Blood Component Type RED CELLS,LR    Unit division 00    Status of Unit ISSUED    Transfusion Status OK TO TRANSFUSE    Crossmatch Result      Compatible Performed at [facility], [address], [place], [place] [address]    Unit Number [id number]    Blood Component Type RED CELLS,LR    Unit division 00    Status of Unit ALLOCATED    Transfusion Status OK TO TRANSFUSE    Crossmatch Result Compatible   Prepare RBC     Status: None   Collection Time: 08/21/18  3:25 PM  Result Value Ref Range   Order Confirmation      ORDER PROCESSED BY BLOOD BANK Performed at [facility], [address], [place], [place] [address]   ABO/Rh     Status: None (Preliminary result)   Collection Time: 08/21/18  3:29 PM  Result Value Ref Range   ABO/RH(D)      O POS Performed at [facility], [address], [place], [place] [address]    Dg Chest 2 View  Result Date: 08/21/2018 CLINICAL DATA:  Shortness of breath for 2 weeks. EXAM: CHEST - 2 VIEW COMPARISON:  None. FINDINGS: The heart size and mediastinal contours are within normal limits. Both lungs are clear. There is scoliosis of spine. IMPRESSION: No active cardiopulmonary disease. Electronically Signed   By: Sherian Rein M.D.   On: 08/21/2018 09:24    Pending Labs [employer] (From admission, onward)    Start     Ordered   Signed and Held  Hemoglobin and hematocrit, blood  Now then every 6 hours,   R     Signed and Held   Medical illustrator and Held  Ferritin  Add-on,   R     Signed and Held   Signed and Held  Iron and TIBC  Add-on,   R     Signed and Held   Signed and Held  Vitamin B12  Add-on,   R     Signed and Held   Signed and Held  Folate RBC  Add-on,   R     Signed and Held   Signed and Held  HIV antibody (Routine Testing)  Once,   R     Signed and Held   Signed and Held  TSH  Once,   R     Signed and Held   Signed and Held   Comprehensive metabolic panel  Tomorrow morning,   R     Signed and Held   Signed and Held  CBC  Tomorrow morning,   R     Signed and Held          Vitals/Pain Today's Vitals   08/21/18 2000 08/21/18 2017 08/21/18 2020 08/21/18 2020  BP: (!) 158/118 (!) 165/116  (!) 165/116  Pulse:  74  86  Resp:    18  Temp:  98.4 F (36.9 C) 98.4 F (36.9 C)  TempSrc:   Oral Oral  SpO2:  100%  100%  Weight:      Height:      PainSc:        Isolation Precautions No active isolations  Medications Medications  potassium chloride SA (K-DUR,KLOR-CON) CR tablet 40 mEq (40 mEq Oral Given 08/21/18 1717)  0.9 %  sodium chloride infusion (10 mL/hr Intravenous New Bag/Given 08/21/18 1705)  potassium chloride SA (K-DUR,KLOR-CON) CR tablet 40 mEq (40 mEq Oral Given 08/21/18 2019)    Mobility walks Low fall risk   Focused Assessments    Recommendations: See Admitting Provider Note  Report given to: Okey DupreRose [phone]  Additional Notes:

## 2018-08-21 NOTE — ED Notes (Signed)
Delay in labs, unable to draw. Phlebotomy called and will draw asap.

## 2018-08-21 NOTE — H&P (Signed)
History and Physical    Eileen Miller ZOX:[id number] DOB: 11/16/1995 DOA: 08/21/2018  PCP: Patient, No Pcp Per Patient coming from: home    Chief Complaint: SOB  HPI: Eileen Miller is a 23 y.o. female with medical history significant of C-section x2, recent abortion, iron deficiency anemia due to menorrhagia, essential hypertension who presented with shortness of breath and is found to be profoundly anemic.  Patient reports she had a medical abortion approximately 1 month ago with methotrexate.  Patient reports after taking the methotrexate she began to pass heavy clots for several weeks.  Patient was told this was normal.  Vaginal bleeding resolved approximately 3 days ago.  Patient approximately 1 to 2 weeks ago began to report increasing shortness of breath and exertional dyspnea.  Patient did not have any lower extremity swelling, chest pain, orthopnea, paroxysmal nocturnal dyspnea.  Patient also reported hearing a "whooshing" sound in her ears bilaterally.  Patient did have some chest tightness but but no frank chest pain.  Patient also reported checking her blood pressure and noted that it was quite elevated.  Patient did not seek care with her primary care doctor or her OB/GYN because she does not have insurance.  Of note approximately 9 months ago in March 2019 patient did have a C-section and was noted to be anemic then and required 2 units packed red blood cells.  On review of the chart patient is a long history of iron deficiency anemia.  Patient denies any hematemesis, hematochezia, melena, any family history of hematological disorders, fevers, cough, congestion, rhinorrhea, nausea, vomiting, diarrhea. With regard to her gynecologic history patient reports that she has heavy periods for approximately 4 to 5 days taking up to 2 pads a day.  She reports that she did have Depo-Provera shots which regulated her periods but she could no longer afford this due to losing insurance.   ED  Course: In the emergency department patient's vitals were noted to be normal except for mild hypertension.  Her labs are notable for hemoglobin of 3.8 with profound microcytosis and a normal RDW.  BMP was notable for potassium of 2.7.  Patient was given 40 mEq of potassium and 2 units packed red blood cells were ordered.  Chest x-ray was unremarkable.  Serum hCG was 13.4.  OB/GYN was called by this writer and they did not recommend any further imaging with transvaginal ultrasound or repeat serum hCG as this was expected after abortion.  Review of Systems: As per HPI otherwise 10 point review of systems negative.    Past Medical History:  Diagnosis Date  . History of cesarean section 03/03/2016   For severe Preeclampsia. Did not labor.   . History of preterm delivery 03/03/2016   For severe Preeclampsia. Did not labor.   . Hypertension   . Iron deficiency anemia due to chronic blood loss 08/21/2018  . Severe preeclampsia 2015    Past Surgical History:  Procedure Laterality Date  . CESAREAN SECTION    . CESAREAN SECTION N/A 10/20/2017   Procedure: CESAREAN SECTION;  Surgeon: Lazaro ArmsEure, Luther H, MD;  [facility];  Service: Obstetrics;  Laterality: N/A;  . EYE SURGERY    . HERNIA REPAIR       reports that she has never smoked. She has never used smokeless tobacco. She reports that she does not drink alcohol or use drugs.  Allergies  Allergen Reactions  . Food Swelling and Other (See Comments)    Peanut butter causes tongue swelling  Family History  Problem Relation Age of Onset  . Hypertension Father   . Hypertension Maternal Grandmother   . Hypertension Paternal Grandmother     Prior to Admission medications   Medication Sig Start Date End Date Taking? Authorizing Provider  lisinopril-hydrochlorothiazide (PRINZIDE,ZESTORETIC) 20-25 MG tablet Take 1 tablet by mouth daily. 12/03/17  Yes Thressa Sheller D, CNM  amLODipine (NORVASC) 10 MG tablet Take 1 tablet (10 mg  total) by mouth daily. Patient not taking: Reported on 08/21/2018 10/24/17   Constant, Peggy, MD  ferrous sulfate (FERROUSUL) 325 (65 FE) MG tablet Take 1 tablet (325 mg total) by mouth 2 (two) times daily. Patient not taking: Reported on 10/16/2017 05/20/17   Levie Heritage, DO  ibuprofen (ADVIL,MOTRIN) 600 MG tablet Take 1 tablet (600 mg total) by mouth every 6 (six) hours. Patient not taking: Reported on 12/03/2017 10/23/17   Constant, Peggy, MD  oxyCODONE-acetaminophen (PERCOCET/ROXICET) 5-325 MG tablet Take 1 tablet by mouth every 4 (four) hours as needed (pain scale 4-7). Patient not taking: Reported on 12/03/2017 10/23/17   Constant, Peggy, MD  triamterene-hydrochlorothiazide (MAXZIDE-25) 37.5-25 MG tablet Take 2 tablets by mouth daily. Patient not taking: Reported on 08/21/2018 10/24/17   Catalina Antigua, MD    Physical Exam: Vitals:   08/21/18 0902 08/21/18 1107 08/21/18 1300 08/21/18 1334  BP: (!) 159/111 (!) 154/107 (!) 159/108 (!) 153/110  Pulse:  68 67 82  Resp:  19 15 (!) 21  Temp:  (!) 97.1 F (36.2 C)    TempSrc:  Oral    SpO2: 100% 100% 100% 100%  Weight:      Height:        Constitutional: NAD, calm, comfortable Vitals:   08/21/18 0902 08/21/18 1107 08/21/18 1300 08/21/18 1334  BP: (!) 159/111 (!) 154/107 (!) 159/108 (!) 153/110  Pulse:  68 67 82  Resp:  19 15 (!) 21  Temp:  (!) 97.1 F (36.2 C)    TempSrc:  Oral    SpO2: 100% 100% 100% 100%  Weight:      Height:       Eyes: Anicteric sclera ENMT: Good dentition, moist because membranes, pale appearing mucosa Neck: normal, supple Respiratory: clear to auscultation bilaterally, no wheezing, no crackles. Normal respiratory effort. No accessory muscle use.  Cardiovascular: Regular rate and rhythm, 2 out of 6 systolic murmur heard best across precordium.  Abdomen: Soft, nontender, no rebound or guarding, plus bowel sounds Musculoskeletal: No lower extremity edema Skin: Well-healed C-section incisions,  pale-appearing Neurologic: Grossly intact, moving all extremities Psychiatric: Normal judgment and insight. Alert and oriented x 3. Normal mood.    Labs on Admission: I have personally reviewed following labs and imaging studies  CBC: Recent Labs  Lab 08/21/18 1304  WBC 3.2*  HGB 3.8*  HCT 14.9*  MCV 69.6*  PLT 282   Basic Metabolic Panel: Recent Labs  Lab 08/21/18 1304  NA 140  K 2.7*  CL 107  CO2 25  GLUCOSE 87  BUN 12  CREATININE 0.64  CALCIUM 8.6*   GFR: Estimated Creatinine Clearance: 117.6 mL/min (by C-G formula based on SCr of 0.64 mg/dL). Liver Function Tests: No results for input(s): AST, ALT, ALKPHOS, BILITOT, PROT, ALBUMIN in the last 168 hours. No results for input(s): LIPASE, AMYLASE in the last 168 hours. No results for input(s): AMMONIA in the last 168 hours. Coagulation Profile: No results for input(s): INR, PROTIME in the last 168 hours. Cardiac Enzymes: No results for input(s): CKTOTAL, CKMB, CKMBINDEX, TROPONINI in  the last 168 hours. BNP (last 3 results) No results for input(s): PROBNP in the last 8760 hours. HbA1C: No results for input(s): HGBA1C in the last 72 hours. CBG: No results for input(s): GLUCAP in the last 168 hours. Lipid Profile: No results for input(s): CHOL, HDL, LDLCALC, TRIG, CHOLHDL, LDLDIRECT in the last 72 hours. Thyroid Function Tests: No results for input(s): TSH, T4TOTAL, FREET4, T3FREE, THYROIDAB in the last 72 hours. Anemia Panel: No results for input(s): VITAMINB12, FOLATE, FERRITIN, TIBC, IRON, RETICCTPCT in the last 72 hours. Urine analysis:    Component Value Date/Time   COLORURINE AMBER (A) 10/16/2017 1530   APPEARANCEUR HAZY (A) 10/16/2017 1530   LABSPEC 1.020 10/16/2017 1530   PHURINE 7.0 10/16/2017 1530   GLUCOSEU 50 (A) 10/16/2017 1530   HGBUR NEGATIVE 10/16/2017 1530   BILIRUBINUR NEGATIVE 10/16/2017 1530   BILIRUBINUR neg 05/18/2012 1558   KETONESUR NEGATIVE 10/16/2017 1530   PROTEINUR 100 (A)  10/16/2017 1530   UROBILINOGEN 1.0 07/01/2017 1309   NITRITE NEGATIVE 10/16/2017 1530   LEUKOCYTESUR NEGATIVE 10/16/2017 1530    Radiological Exams on Admission: Dg Chest 2 View  Result Date: 08/21/2018 CLINICAL DATA:  Shortness of breath for 2 weeks. EXAM: CHEST - 2 VIEW COMPARISON:  None. FINDINGS: The heart size and mediastinal contours are within normal limits. Both lungs are clear. There is scoliosis of spine. IMPRESSION: No active cardiopulmonary disease. Electronically Signed   By: Sherian ReinWei-Chen  Lin M.D.   On: 08/21/2018 09:24    EKG: Independently reviewed.  Sinus rhythm, no acute ST segment changes  Assessment/Plan Active Problems:   HTN (hypertension)   History of cesarean section   Anemia   Hypokalemia   Iron deficiency anemia due to chronic blood loss   #) Symptomatic iron deficiency anemia due to chronic vaginal losses: Suspect patient has iron deficiency anemia from menorrhagia and superimposed on this was bleeding from completed abortion.  On discussion with OB/GYN they did not feel that patient needed transvaginal ultrasound to look for retained products of conception or repeat hCG.  Incidentally while the patient does not have any history of a blood dyscrasia and we have low suspicion for hemolysis at this time it might be reasonable to do globin electrophoresis if she is persistently anemic despite adequate repletion of iron as per her mother there is a history of sickle cell trait in the family. - Transfused 2 units packed red blood cells, recheck H&H every 6 hours for 1 day - We will check iron studies, B12, folate - As patient does not have insurance OB/GYN [facility] -We will consider IV iron though she would get quite an iron load with the amount of blood transfusions she is going to receive  #) Hypokalemia: Unclear etiology.  Suspect this might be related to her anemia though the exact cause is unclear.  Additionally  she has a history of hypertension and this might be unrelated to her anemia and might actually be a sign of possible secondary hypertension.  Additionally patient is on HCTZ which while notorious for causing hypokalemia though it was paired with an ACE inhibitor.  T.  Regardless we will aggressively replete -Telemetry -Aggressive repletion  #) Hypertension: Patient was on lisinopril/HCTZ, will hold in setting of hyperkalemia -Start amlodipine 10 mg daily  #) Menorrhagia: Per above, gave patient information on relatively expensive over-the-counter oral contraceptive.  Fluids: Tolerating p.o. Electrolytes: Per above Nutrition: Heart healthy diet  Prophylaxis: Ambulatory  Disposition: Pending hemoglobin greater than  7  Full code   Delaine Lame MD [facility]  If 7PM-7AM, please contact night-coverage [email or web address] Password [facility]  08/21/2018, 4:04 PM

## 2018-08-21 NOTE — ED Notes (Signed)
2 unsuccessful attempts to place iv by Clinical research associate.

## 2018-08-21 NOTE — ED Notes (Signed)
Date and time results received: 08/21/18 13:40   Test: Potassium  Critical Value: 2.57mmol/L  Name of Provider Notified: Dr.S. Deretha Emory   Orders Received? Or Actions Taken?: Will continue to monitor and await new orders.

## 2018-08-21 NOTE — ED Triage Notes (Addendum)
Patient reports that she has had increased SOB when she walks down a flight of step and not as much when she walks up the steps x 3 weeks.  Patient had an abortion 07/18/18.

## 2018-08-22 LAB — COMPREHENSIVE METABOLIC PANEL
ALT: 30 U/L (ref 0–44)
AST: 35 U/L (ref 15–41)
Albumin: 3.7 g/dL (ref 3.5–5.0)
Alkaline Phosphatase: 41 U/L (ref 38–126)
BUN: 10 mg/dL (ref 6–20)
CO2: 23 mmol/L (ref 22–32)
Calcium: 8.8 mg/dL — ABNORMAL LOW (ref 8.9–10.3)
Chloride: 108 mmol/L (ref 98–111)
Creatinine, Ser: 0.68 mg/dL (ref 0.44–1.00)
GFR calc Af Amer: >60 mL/min (ref >60)
GFR calc non Af Amer: >60 mL/min (ref >60)
Glucose, Bld: 91 mg/dL (ref 70–99)
Potassium: 3.2 mmol/L — ABNORMAL LOW (ref 3.5–5.1)
Sodium: 139 mmol/L (ref 135–145)
Total Bilirubin: 0.7 mg/dL (ref 0.3–1.2)
Total Protein: 6.6 g/dL (ref 6.5–8.1)

## 2018-08-22 LAB — IRON AND TIBC
Iron: 15 ug/dL — ABNORMAL LOW (ref 28–170)
Saturation Ratios: 3 % — ABNORMAL LOW (ref 10.4–31.8)
TIBC: 472 ug/dL — ABNORMAL HIGH (ref 250–450)
UIBC: 457 ug/dL

## 2018-08-22 LAB — CBC
HCT: 21.5 % — ABNORMAL LOW (ref 36.0–46.0)
Hemoglobin: 6.3 g/dL — CL (ref 12.0–15.0)
MCH: 20.5 pg — ABNORMAL LOW (ref 26.0–34.0)
MCHC: 29.3 g/dL — ABNORMAL LOW (ref 30.0–36.0)
MCV: 70 fL — ABNORMAL LOW (ref 80.0–100.0)
Platelets: 273 10*3/uL (ref 150–400)
RBC: 3.07 MIL/uL — ABNORMAL LOW (ref 3.87–5.11)
RDW: 18.2 % — ABNORMAL HIGH (ref 11.5–15.5)
WBC: 4.6 10*3/uL (ref 4.0–10.5)
nRBC: 0.6 % — ABNORMAL HIGH (ref 0.0–0.2)

## 2018-08-22 LAB — HEMOGLOBIN AND HEMATOCRIT, BLOOD
HCT: 17.7 % — ABNORMAL LOW (ref 36.0–46.0)
HCT: 31.2 % — ABNORMAL LOW (ref 36.0–46.0)
Hemoglobin: 4.9 g/dL — CL (ref 12.0–15.0)
Hemoglobin: 9.4 g/dL — ABNORMAL LOW (ref 12.0–15.0)

## 2018-08-22 LAB — PREPARE RBC (CROSSMATCH)

## 2018-08-22 LAB — TSH: TSH: 1.299 u[IU]/mL (ref 0.350–4.500)

## 2018-08-22 LAB — VITAMIN B12: Vitamin B-12: 223 pg/mL (ref 180–914)

## 2018-08-22 LAB — HIV ANTIBODY (ROUTINE TESTING W REFLEX): HIV Screen 4th Generation wRfx: NONREACTIVE

## 2018-08-22 LAB — FERRITIN: Ferritin: 1 ng/mL — ABNORMAL LOW (ref 11–307)

## 2018-08-22 LAB — ABO/RH: ABO/RH(D): O POS

## 2018-08-22 LAB — COMPREHENSIVE METABOLIC PANEL WITH GFR: Anion gap: 8 (ref 5–15)

## 2018-08-22 MED ORDER — AMLODIPINE BESYLATE 10 MG PO TABS: 10.0000 mg | ORAL_TABLET | Freq: Every day | ORAL | 1 refills | Status: DC

## 2018-08-22 MED ORDER — SODIUM CHLORIDE 0.9% IV SOLUTION
Freq: Once | INTRAVENOUS | Status: AC
  Administered 2018-08-22: 09:00:00 via INTRAVENOUS

## 2018-08-22 MED ORDER — FERROUS SULFATE 325 (65 FE) MG PO TABS: 325.0000 mg | ORAL_TABLET | Freq: Two times a day (BID) | ORAL | 11 refills | Status: DC

## 2018-08-22 MED ORDER — NORGESTIM-ETH ESTRAD TRIPHASIC 0.18/0.215/0.25 MG-35 MCG PO TABS: 1.0000 | ORAL_TABLET | Freq: Every day | ORAL | 2 refills | Status: DC

## 2018-08-22 MED ORDER — POTASSIUM CHLORIDE CRYS ER 20 MEQ PO TBCR
40.0000 meq | EXTENDED_RELEASE_TABLET | ORAL | Status: AC
  Administered 2018-08-22 (×2): 40 meq via ORAL
  Filled 2018-08-22 (×2): qty 2

## 2018-08-22 NOTE — Care Management Note (Signed)
Case Management Note  Patient Details  Name: Eileen Miller MRN: [id number] Date of Birth: 10/13/1995  Subjective/Objective: Hypokalemia. From home. No pcp,or insurance. Provided w/pcp listing-encouraged [facility] facilities-patient will make own appt. Provided w/other community resources-health insurance,goodrx.                   Action/Plan:d/c home.   Expected Discharge Date:  (unknown)               Expected Discharge Plan:  Home/Self Care  In-House Referral:     Discharge planning Services  CM Consult, [facility]igent [facility]  Post Acute Care Choice:    Choice offered to:     DME Arranged:    DME Agency:     HH Arranged:    HH Agency:     Status of Service:  In process, will continue to follow  If discussed at Long Length of Stay Meetings, dates discussed:    Additional Comments:  Lanier ClamMahabir, Dashawna Delbridge, RN 08/22/2018, 1:43 PM

## 2018-08-22 NOTE — Discharge Summary (Signed)
Physician Discharge Summary  Eileen Miller KJZ:[id number] DOB: [birth year] DOA: 08/21/2018  PCP: Patient, No Pcp Per  Admit date: 08/21/2018 Discharge date: 08/22/2018  Admitted From: Home  Disposition:  Home  Recommendations for Outpatient Follow-up:  1. Follow up with PCP in 1-2 weeks 2. Please obtain BMP/CBC in one week   Home Health: No Equipment/Devices:no  Discharge Condition: stable CODE STATUS: FULL Diet recommendation: Regular   Brief/Interim Summary:  #) Symptomatic iron deficiency anemia due to chronic vaginal losses: Patient was admitted with shortness of breath and found to have symptomatic anemia.  Patient received 4 units packed red blood cells.  It was felt the patient has underlying menorrhagia as a cause of her chronic iron deficiency anemia and subsequently was treated for an abortion with methotrexate and had vaginal bleeding which superimposed additional bleeding.  Patient was given 4 units packed red blood cells with improvement in hemoglobin.  Iron studies showed severe iron deficiency anemia so patient was prescribed iron.  Her B12 and folate were normal.  As patient did not have insurance OB/GYN recommended over-the-counter Tri-Sprintec at [facility] and patient was given prescription for this.  OB/GYN also did not recommend any additional rechecks of her hCG is it was almost normal and did not recommend any transvaginal imaging.  #) Hypokalemia: This is likely secondary to patient's home antihypertensives.  Her HCTZ was held and she was discharged on a different antihypertensive.  Her potassium was aggressively repleted.  She was monitored on telemetry.  #) Hypertension: Patient's lisinopril-HCTZ was discontinued.  She was started on amlodipine 10 mg daily.  #) Menorrhagia: Please see above.  Patient was given Tri-Sprintec prescription.  Discharge Diagnoses:  Active Problems:   HTN (hypertension)   History of cesarean section   Anemia   Hypokalemia   Iron  deficiency anemia due to chronic blood loss   Symptomatic anemia    Discharge Instructions   Allergies as of 08/22/2018      Reactions   Food Swelling, Other (See Comments)   Peanut butter causes tongue swelling       Medication List    STOP taking these medications   ibuprofen 600 MG tablet Commonly known as:  ADVIL,MOTRIN   lisinopril-hydrochlorothiazide 20-25 MG tablet Commonly known as:  PRINZIDE,ZESTORETIC   oxyCODONE-acetaminophen 5-325 MG tablet Commonly known as:  PERCOCET/ROXICET   triamterene-hydrochlorothiazide 37.5-25 MG tablet Commonly known as:  MAXZIDE-25     TAKE these medications   amLODipine 10 MG tablet Commonly known as:  NORVASC Take 1 tablet (10 mg total) by mouth at bedtime. What changed:  when to take this   ferrous sulfate 325 (65 FE) MG tablet Commonly known as:  FERROUSUL Take 1 tablet (325 mg total) by mouth 2 (two) times daily.   Norgestimate-Ethinyl Estradiol Triphasic 0.18/0.215/0.25 MG-35 MCG tablet Commonly known as:  TRI-SPRINTEC Take 1 tablet by mouth daily.       Allergies  Allergen Reactions  . Food Swelling and Other (See Comments)    Peanut butter causes tongue swelling     Consultations:  None   Procedures/Studies: Dg Chest 2 View  Result Date: 08/21/2018 CLINICAL DATA:  Shortness of breath for 2 weeks. EXAM: CHEST - 2 VIEW COMPARISON:  None. FINDINGS: The heart size and mediastinal contours are within normal limits. Both lungs are clear. There is scoliosis of spine. IMPRESSION: No active cardiopulmonary disease. Electronically Signed   By: Sherian Rein M.D.   On: 08/21/2018 09:24   Ct Angio Chest Pe W Or Wo  Contrast  Result Date: 08/21/2018 CLINICAL DATA:  23 year old female with increase shortness of breath on exertion x3 weeks. EXAM: CT ANGIOGRAPHY CHEST WITH CONTRAST TECHNIQUE: Multidetector CT imaging of the chest was performed using the standard protocol during bolus administration of intravenous contrast.  Multiplanar CT image reconstructions and MIPs were obtained to evaluate the vascular anatomy. CONTRAST:  ISOVUE-370 IOPAMIDOL (ISOVUE-370) INJECTION 76% COMPARISON:  Chest radiographs earlier today. FINDINGS: Cardiovascular: Adequate contrast bolus timing in the pulmonary arterial tree. No focal filling defect identified in the pulmonary arteries to suggest acute pulmonary embolism. No calcified coronary artery atherosclerosis is evident. The visible aorta is normal. Cardiac size does appear enlarged (series 4, image 57). No pericardial effusion. Mediastinum/Nodes: Negative. Lungs/Pleura: Major airways are patent. Both lungs are within normal limits; minor atelectasis at both lung bases. No pleural effusion Upper Abdomen: Negative visible liver, spleen, pancreas, adrenal glands, kidneys, and stomach. Musculoskeletal: Dextroconvex thoracic scoliosis with straightening of kyphosis. No acute osseous abnormality identified. Review of the MIP images confirms the above findings. IMPRESSION: 1. There is a degree of Cardiomegaly. Follow-up Echocardiogram may be valuable. 2. Otherwise negative Chest CTA, negative for pulmonary embolus. 3. Thoracic scoliosis. Electronically Signed   By: Odessa Fleming M.D.   On: 08/21/2018 22:02     Subjective:   Discharge Exam: Vitals:   08/22/18 0809 08/22/18 0840  BP: (!) 142/105 (!) 151/119  Pulse: 83 77  Resp: 14 16  Temp: 98.6 F (37 C) 98.6 F (37 C)  SpO2: 100% 100%   Vitals:   08/22/18 0201 08/22/18 0538 08/22/18 0809 08/22/18 0840  BP: (!) 155/108 (!) 142/109 (!) 142/105 (!) 151/119  Pulse: 74 79 83 77  Resp: 18 20 14 16   Temp: 99 F (37.2 C) 98.5 F (36.9 C) 98.6 F (37 C) 98.6 F (37 C)  TempSrc: Oral Oral Oral Oral  SpO2: 100% 99% 100% 100%  Weight:      Height:        General: Pt is alert, awake, not in acute distress Cardiovascular: RRR, 2/6 murmur  Respiratory: CTA bilaterally, no wheezing, no rhonchi Abdominal: Soft, NT, ND, bowel sounds  + Extremities: no edema    The results of significant diagnostics from this hospitalization (including imaging, microbiology, ancillary and laboratory) are listed below for reference.     Microbiology: No results found for this or any previous visit (from the past 240 hour(s)).   Labs: BNP (last 3 results) No results for input(s): BNP in the last 8760 hours. Basic Metabolic Panel: Recent Labs  Lab 08/21/18 1304 08/22/18 0437  NA 140 139  K 2.7* 3.2*  CL 107 108  CO2 25 23  GLUCOSE 87 91  BUN 12 10  CREATININE 0.64 0.68  CALCIUM 8.6* 8.8*   Liver Function Tests: Recent Labs  Lab 08/22/18 0437  AST 35  ALT 30  ALKPHOS 41  BILITOT 0.7  PROT 6.6  ALBUMIN 3.7   No results for input(s): LIPASE, AMYLASE in the last 168 hours. No results for input(s): AMMONIA in the last 168 hours. CBC: Recent Labs  Lab 08/21/18 1304 08/21/18 2250 08/22/18 0437  WBC 3.2*  --  4.6  HGB 3.8* 4.9* 6.3*  HCT 14.9* 17.7* 21.5*  MCV 69.6*  --  70.0*  PLT 282  --  273   Cardiac Enzymes: No results for input(s): CKTOTAL, CKMB, CKMBINDEX, TROPONINI in the last 168 hours. BNP: Invalid input(s): POCBNP CBG: No results for input(s): GLUCAP in the last 168 hours. D-Dimer  Recent Labs    08/21/18 1525  DDIMER 2.14*   Hgb A1c No results for input(s): HGBA1C in the last 72 hours. Lipid Profile No results for input(s): CHOL, HDL, LDLCALC, TRIG, CHOLHDL, LDLDIRECT in the last 72 hours. Thyroid function studies Recent Labs    08/21/18 2250  TSH 1.299   Anemia work up Recent Labs    08/21/18 2250  VITAMINB12 223  FERRITIN 1*  TIBC 472*  IRON 15*   Urinalysis    Component Value Date/Time   COLORURINE AMBER (A) 10/16/2017 1530   APPEARANCEUR HAZY (A) 10/16/2017 1530   LABSPEC 1.020 10/16/2017 1530   PHURINE 7.0 10/16/2017 1530   GLUCOSEU 50 (A) 10/16/2017 1530   HGBUR NEGATIVE 10/16/2017 1530   BILIRUBINUR NEGATIVE 10/16/2017 1530   BILIRUBINUR neg 05/18/2012 1558    KETONESUR NEGATIVE 10/16/2017 1530   PROTEINUR 100 (A) 10/16/2017 1530   UROBILINOGEN 1.0 07/01/2017 1309   NITRITE NEGATIVE 10/16/2017 1530   LEUKOCYTESUR NEGATIVE 10/16/2017 1530   Sepsis Labs Invalid input(s): PROCALCITONIN,  WBC,  LACTICIDVEN Microbiology No results found for this or any previous visit (from the past 240 hour(s)).   Time coordinating discharge: 35  SIGNED:   Delaine LameShrey C Caraline Deutschman, MD  [facility] 08/22/2018, 10:05 AM  If 7PM-7AM, please contact night-coverage [email or web address] Password [email or web address]

## 2018-08-22 NOTE — Discharge Instructions (Signed)
Anemia  Anemia is a condition in which you do not have enough red blood cells or hemoglobin. Hemoglobin is a substance in red blood cells that carries oxygen. When you do not have enough red blood cells or hemoglobin (are anemic), your body cannot get enough oxygen and your organs may not work properly. As a result, you may feel very tired or have other problems. What are the causes? Common causes of anemia include:  Excessive bleeding. Anemia can be caused by excessive bleeding inside or outside the body, including bleeding from the intestine or from periods in women.  Poor nutrition.  Long-lasting (chronic) kidney, thyroid, and liver disease.  Bone marrow disorders.  Cancer and treatments for cancer.  HIV (human immunodeficiency virus) and AIDS (acquired immunodeficiency syndrome).  Treatments for HIV and AIDS.  Spleen problems.  Blood disorders.  Infections, medicines, and autoimmune disorders that destroy red blood cells. What are the signs or symptoms? Symptoms of this condition include:  Minor weakness.  Dizziness.  Headache.  Feeling heartbeats that are irregular or faster than normal (palpitations).  Shortness of breath, especially with exercise.  Paleness.  Cold sensitivity.  Indigestion.  Nausea.  Difficulty sleeping.  Difficulty concentrating. Symptoms may occur suddenly or develop slowly. If your anemia is mild, you may not have symptoms. How is this diagnosed? This condition is diagnosed based on:  Blood tests.  Your medical history.  A physical exam.  Bone marrow biopsy. Your health care provider may also check your stool (feces) for blood and may do additional testing to look for the cause of your bleeding. You may also have other tests, including:  Imaging tests, such as a CT scan or MRI.  Endoscopy.  Colonoscopy. How is this treated? Treatment for this condition depends on the cause. If you continue to lose a lot of blood, you may  need to be treated at a hospital. Treatment may include:  Taking supplements of iron, vitamin M08, or folic acid.  Taking a hormone medicine (erythropoietin) that can help to stimulate red blood cell growth.  Having a blood transfusion. This may be needed if you lose a lot of blood.  Making changes to your diet.  Having surgery to remove your spleen. Follow these instructions at home:  Take over-the-counter and prescription medicines only as told by your health care provider.  Take supplements only as told by your health care provider.  Follow any diet instructions that you were given.  Keep all follow-up visits as told by your health care provider. This is important. Contact a health care provider if:  You develop new bleeding anywhere in the body. Get help right away if:  You are very weak.  You are short of breath.  You have pain in your abdomen or chest.  You are dizzy or feel faint.  You have trouble concentrating.  You have bloody or black, tarry stools.  You vomit repeatedly or you vomit up blood. Summary  Anemia is a condition in which you do not have enough red blood cells or enough of a substance in your red blood cells that carries oxygen (hemoglobin).  Symptoms may occur suddenly or develop slowly.  If your anemia is mild, you may not have symptoms.  This condition is diagnosed with blood tests as well as a medical history and physical exam. Other tests may be needed.  Treatment for this condition depends on the cause of the anemia. This information is not intended to replace advice given to you by  your health care provider. Make sure you discuss any questions you have with your health care provider. °Document Released: 08/30/2004 Document Revised: 08/24/2016 Document Reviewed: 08/24/2016 °Elsevier Interactive Patient Education © 2019 [employer] ° °

## 2018-08-22 NOTE — Progress Notes (Signed)
CRITICAL VALUE ALERT  Critical Value:  Hgb 4.9  Date & Time Notied:  08/22/2018  0045  Provider Notified: Dr. Bruna Potter  Orders Received/Actions taken:  Pt currently  receiving 2nd unit of PRBC's

## 2018-08-22 NOTE — Progress Notes (Addendum)
Per Dr. Clearnce Sorrel, pt only needs to be transfused 2 units of blood not 3.

## 2018-08-22 NOTE — Progress Notes (Signed)
CRITICAL VALUE ALERT  Critical Value:  Hemoglobin 6.3  Date & Time Notied:  08/22/2018 0509  Provider Notified: Dr.  Bruna Potter  Orders Received/Actions taken: Awaiting any new orders

## 2018-08-25 LAB — TYPE AND SCREEN
ABO/RH(D): O POS
Antibody Screen: NEGATIVE
UNIT DIVISION: 0
UNIT DIVISION: 0
UNIT DIVISION: 0
Unit division: 0
Unit division: 0

## 2018-08-25 LAB — BPAM RBC
Blood Product Expiration Date: 202002182359
Blood Product Expiration Date: 202002182359
Blood Product Expiration Date: 202002192359
Blood Product Expiration Date: 202002192359
Blood Product Expiration Date: 202002192359
ISSUE DATE / TIME: 202001161652
ISSUE DATE / TIME: 202001162234
ISSUE DATE / TIME: 202001170816
ISSUE DATE / TIME: 202001171204
Unit Type and Rh: 5100
Unit Type and Rh: 5100
Unit Type and Rh: 5100
Unit Type and Rh: 5100
Unit Type and Rh: 5100

## 2018-08-25 LAB — FOLATE RBC
Folate, Hemolysate: 270 ng/mL
Folate, RBC: 1517 ng/mL (ref >498)
Hematocrit: 17.8 % — CL (ref 34.0–46.6)

## 2019-01-19 ENCOUNTER — Telehealth: Payer: Self-pay | Admitting: Family Medicine

## 2019-01-19 NOTE — Telephone Encounter (Signed)
Attempted to call patient about her appointment on 6/16 @ 3:15. The number that is listed on the patient's chart is no longer her number, so I was not able to speak to the patient.

## 2019-01-20 ENCOUNTER — Telehealth: Payer: Self-pay | Admitting: *Deleted

## 2019-01-20 ENCOUNTER — Other Ambulatory Visit: Payer: Self-pay

## 2019-01-20 DIAGNOSIS — O0990 Supervision of high risk pregnancy, unspecified, unspecified trimester: Secondary | ICD-10-CM

## 2019-01-20 NOTE — Progress Notes (Signed)
Pt had MyChart video appt today @ 1515 which she scheduled by phone on 6/10. She was given MyChart App instructions at that time. She was not able to be reached by phone yesterday for reminder of the visit. Per chart review, pt has not read any of her MyChart messages regarding the appt. I waited live in the [place] video from [phone] and pt did not log in to visit. Since do not have current phone number to contact pt, she will be rescheduled if she calls the office.

## 2019-01-22 ENCOUNTER — Telehealth: Payer: Self-pay | Admitting: Family Medicine

## 2019-01-22 NOTE — Telephone Encounter (Signed)
Attempted to call patients, sgo to get her rescheduled for her missed ob intake. No answer, number rang several times then it stated that the call could not be completed.

## 2019-01-24 IMAGING — CR DG CHEST 2V
2 series · 2 of 2 positions shown · non-contrast
Comparison: None.

CLINICAL DATA: Shortness of breath for 2 weeks.

EXAM:
CHEST - 2 VIEW

[w chest pa]
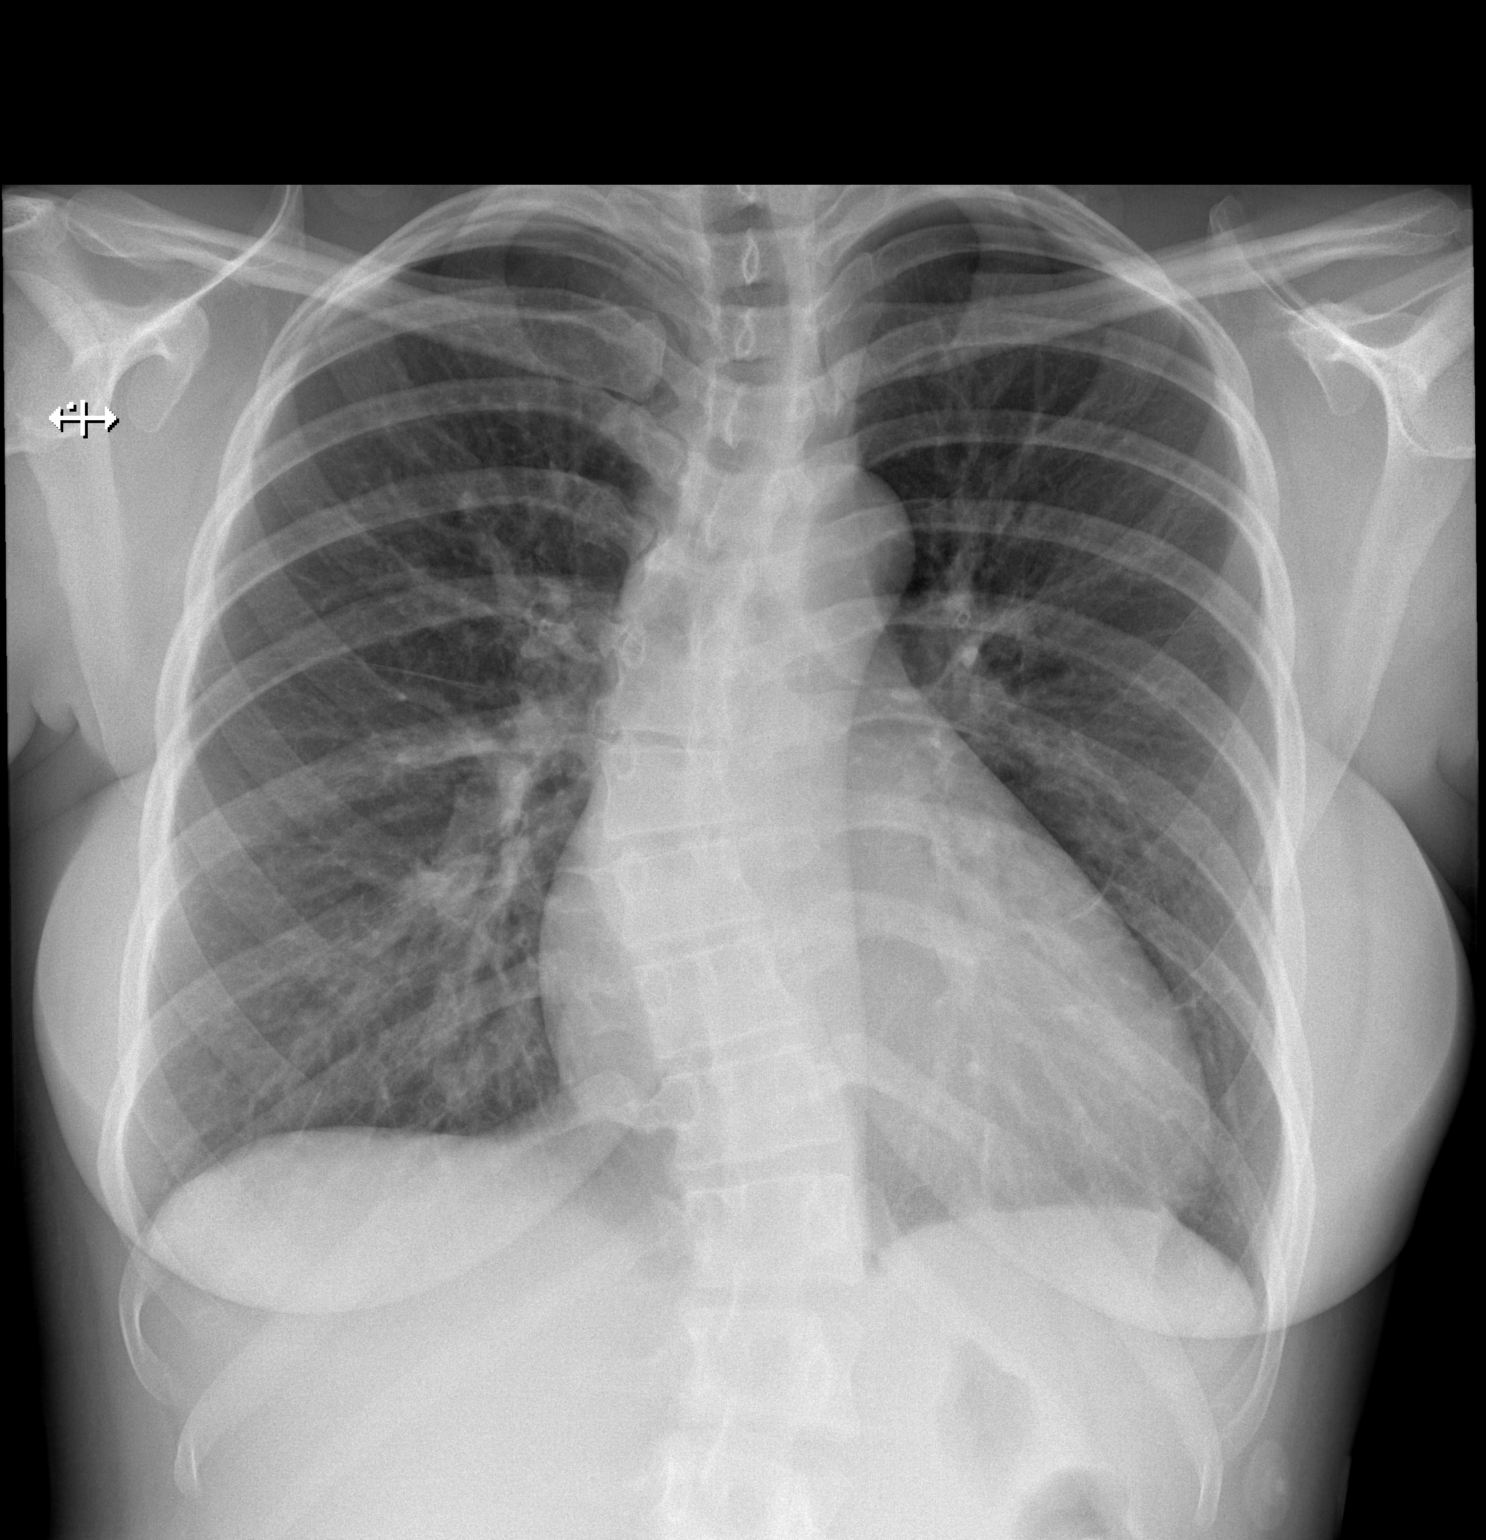

[w chest lat]
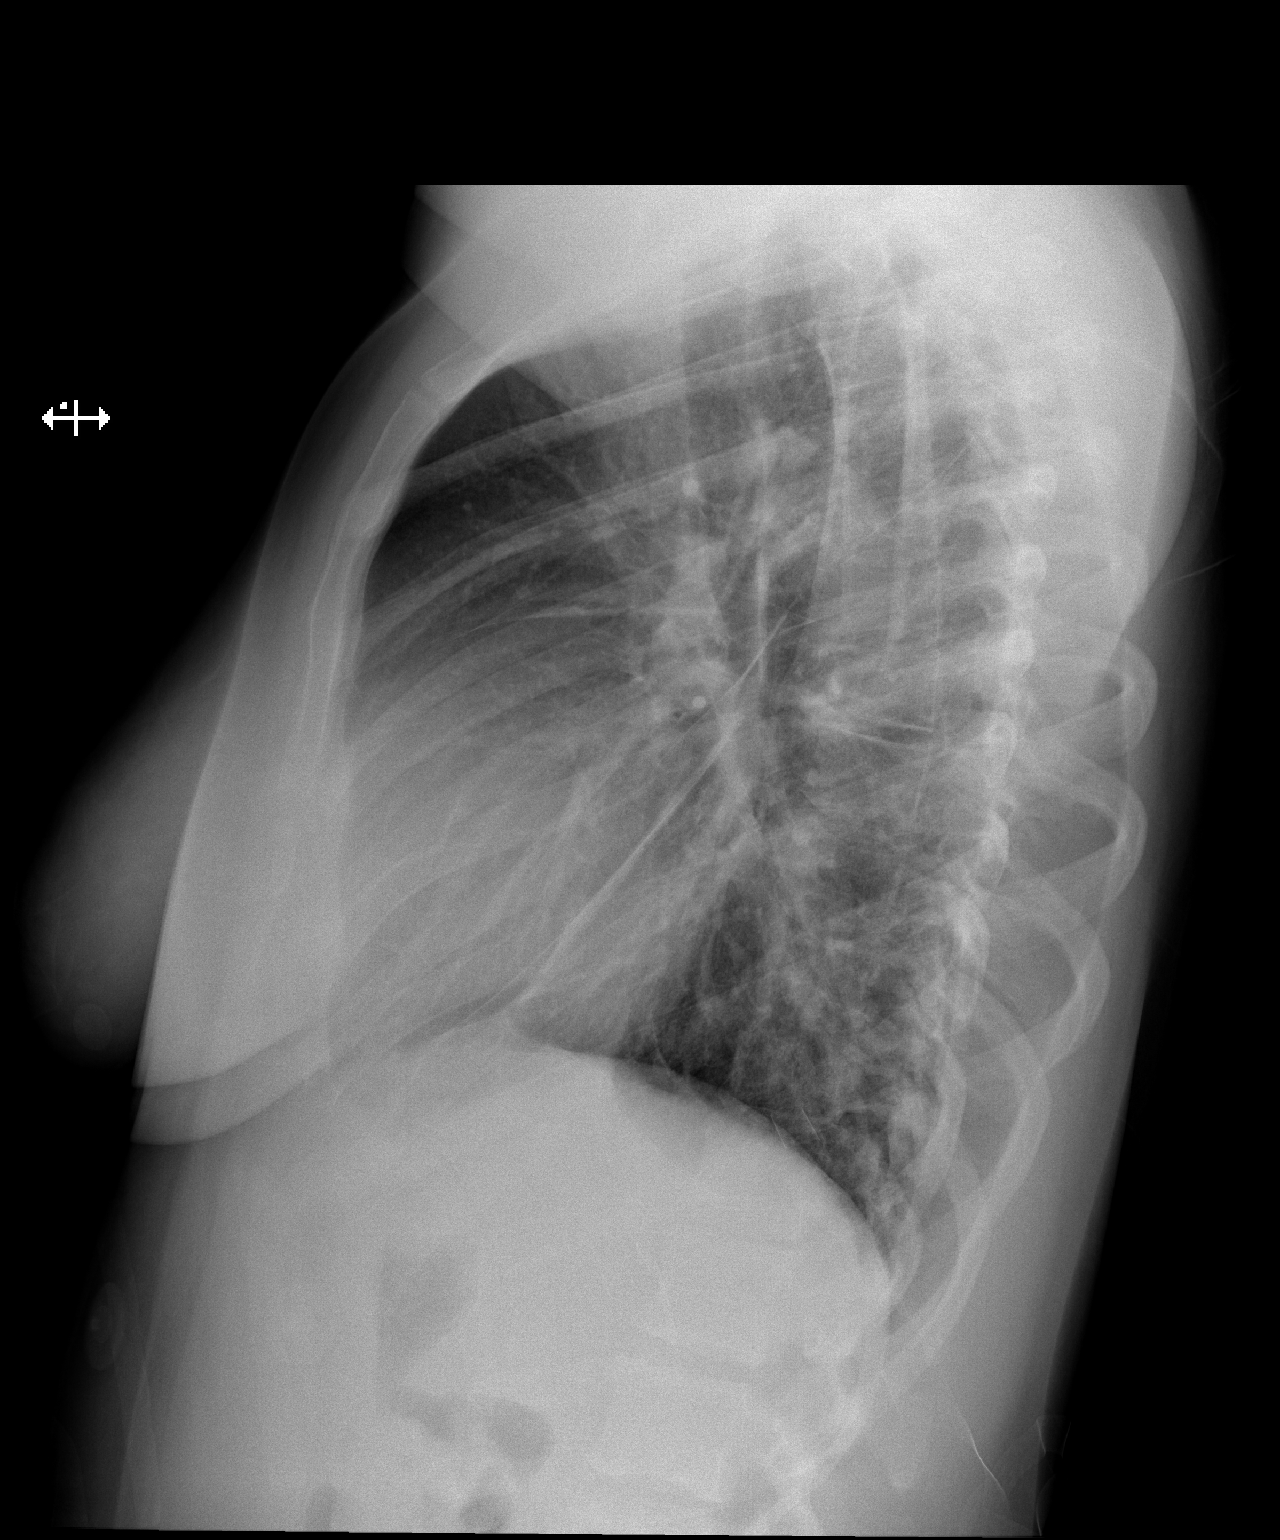

[2 of 2 positions shown; findings below may reference images not displayed]

FINDINGS: The heart size and mediastinal contours are within normal limits.
Both lungs are clear. There is scoliosis of spine.
IMPRESSION: No active cardiopulmonary disease.

## 2019-02-02 ENCOUNTER — Telehealth: Payer: Self-pay | Admitting: Family Medicine

## 2019-02-02 NOTE — Telephone Encounter (Signed)
Attempted to call patient x2 about her appointment on 6/30 @ 10:15. First attempted patient picked up the phone but did not say anything. Second attempted it went straight to voicemail. Voicemail was left instructing patient that the visit will be a mychart visit. Patient instructed to make sure that the Mychart app is downloaded prior to her appointment. Patient instructed to give the office a call with any issues getting the app.

## 2019-02-03 ENCOUNTER — Encounter: Payer: Self-pay | Admitting: *Deleted

## 2019-02-03 ENCOUNTER — Ambulatory Visit (INDEPENDENT_AMBULATORY_CARE_PROVIDER_SITE_OTHER): Payer: Medicaid Other | Admitting: *Deleted

## 2019-02-03 ENCOUNTER — Other Ambulatory Visit: Payer: Self-pay

## 2019-02-03 DIAGNOSIS — Z8751 Personal history of pre-term labor: Secondary | ICD-10-CM

## 2019-02-03 DIAGNOSIS — Z98891 History of uterine scar from previous surgery: Secondary | ICD-10-CM

## 2019-02-03 DIAGNOSIS — O10919 Unspecified pre-existing hypertension complicating pregnancy, unspecified trimester: Secondary | ICD-10-CM

## 2019-02-03 DIAGNOSIS — O0990 Supervision of high risk pregnancy, unspecified, unspecified trimester: Secondary | ICD-10-CM

## 2019-02-03 DIAGNOSIS — O09299 Supervision of pregnancy with other poor reproductive or obstetric history, unspecified trimester: Secondary | ICD-10-CM

## 2019-02-03 NOTE — Progress Notes (Signed)
I connected with  Estill Batten on 02/03/19 at 10:15 AM EDT by telephone and verified that I am speaking with the correct person using two identifiers.   I discussed the limitations, risks, security and privacy concerns of performing an evaluation and management service by telephone and the availability of in person appointments. I also discussed with the patient that there may be a patient responsible charge related to this service. The patient expressed understanding and agreed to proceed.  New Ob intake completed via phone due to complications with MyChart App. Pt reports sure LMP 10/30/18 yielding EDD 08/06/19. She had [place] @ [facility] on 01/12/19 showing EDD 07/31/19. I advised that more than likely we will use EDD of 08/06/19 by sure LMP as her periods are regular. Pt has Hx of CHTN and states she has not taken Norvasc since March due to financial reasons. She denies current H/A or visual disturbances and does not have BP cuff @ home. Pt agreed to check BP and record results in [place] app as instructed once BP cuff has been ordered and received. I stated that I will message Dr. Roselie Awkward to see if she requires BP medication immediately and send her a MyChart message once a reply has been received. She has initiated application for Medicaid and is waiting for final approval. Pt advised that her prenatal care will include a combination of virtual visits as well as face to face visits in office. Visitor restrictions for office visits explained. Pt voiced understanding of all information and instructions given.   Day, Ronnell Freshwater, RN 02/03/2019  11:24 AM

## 2019-02-04 ENCOUNTER — Encounter: Payer: Self-pay | Admitting: Obstetrics and Gynecology

## 2019-02-18 LAB — OB RESULTS CONSOLE PLATELET COUNT: Platelets: 421

## 2019-02-18 LAB — OB RESULTS CONSOLE HGB/HCT, BLOOD: Hemoglobin: 9

## 2019-02-18 LAB — OB RESULTS CONSOLE RUBELLA ANTIBODY, IGM: Rubella: IMMUNE

## 2019-02-18 LAB — OB RESULTS CONSOLE HEPATITIS B SURFACE ANTIGEN: Hepatitis B Surface Ag: NEGATIVE

## 2019-02-18 LAB — OB RESULTS CONSOLE GC/CHLAMYDIA
Chlamydia: NEGATIVE
Gonorrhea: NEGATIVE

## 2019-02-18 LAB — OB RESULTS CONSOLE RPR: RPR: NONREACTIVE

## 2019-02-18 LAB — OB RESULTS CONSOLE HIV ANTIBODY (ROUTINE TESTING): HIV: NONREACTIVE

## 2019-02-19 ENCOUNTER — Other Ambulatory Visit: Payer: Self-pay

## 2019-02-19 ENCOUNTER — Encounter: Payer: Medicaid Other | Admitting: Obstetrics & Gynecology

## 2019-02-26 NOTE — Progress Notes (Signed)
Patient ID: Eileen Miller, female   DOB: August 27, 1995, 23 y.o.   MRN: [id number] Patient seen and assessed by nursing staff during this encounter. I have reviewed the chart and agree with the documentation and plan.  Emeterio Reeve, MD 02/26/2019 11:03 AM

## 2019-03-05 ENCOUNTER — Encounter: Payer: Medicaid Other | Admitting: Family Medicine

## 2019-03-05 ENCOUNTER — Encounter: Payer: Self-pay | Admitting: Family Medicine

## 2019-03-06 NOTE — Progress Notes (Signed)
Patient did not keep appointment today. She may call to reschedule.  

## 2019-05-15 ENCOUNTER — Inpatient Hospital Stay (HOSPITAL_COMMUNITY)
Admission: EM | Admit: 2019-05-15 | Discharge: 2019-05-16 | Disposition: A | Discharge status: Home / Self Care | Payer: Medicaid Other | Attending: Obstetrics and Gynecology | Admitting: Obstetrics and Gynecology

## 2019-05-15 ENCOUNTER — Other Ambulatory Visit: Payer: Self-pay

## 2019-05-15 DIAGNOSIS — O10013 Pre-existing essential hypertension complicating pregnancy, third trimester: Secondary | ICD-10-CM | POA: Insufficient documentation

## 2019-05-15 DIAGNOSIS — O10919 Unspecified pre-existing hypertension complicating pregnancy, unspecified trimester: Secondary | ICD-10-CM

## 2019-05-15 DIAGNOSIS — Z3A28 28 weeks gestation of pregnancy: Secondary | ICD-10-CM | POA: Insufficient documentation

## 2019-05-16 ENCOUNTER — Encounter (HOSPITAL_COMMUNITY): Payer: Self-pay | Admitting: *Deleted

## 2019-05-16 DIAGNOSIS — Z3A28 28 weeks gestation of pregnancy: Secondary | ICD-10-CM

## 2019-05-16 DIAGNOSIS — R030 Elevated blood-pressure reading, without diagnosis of hypertension: Secondary | ICD-10-CM | POA: Diagnosis present

## 2019-05-16 DIAGNOSIS — O10013 Pre-existing essential hypertension complicating pregnancy, third trimester: Secondary | ICD-10-CM | POA: Diagnosis not present

## 2019-05-16 DIAGNOSIS — O10913 Unspecified pre-existing hypertension complicating pregnancy, third trimester: Secondary | ICD-10-CM

## 2019-05-16 LAB — COMPREHENSIVE METABOLIC PANEL
ALT: 10 U/L (ref 0–44)
AST: 18 U/L (ref 15–41)
Albumin: 2.8 g/dL — ABNORMAL LOW (ref 3.5–5.0)
Alkaline Phosphatase: 73 U/L (ref 38–126)
Anion gap: 11 (ref 5–15)
BUN: 9 mg/dL (ref 6–20)
CO2: 23 mmol/L (ref 22–32)
Calcium: 9.3 mg/dL (ref 8.9–10.3)
Chloride: 102 mmol/L (ref 98–111)
Creatinine, Ser: 0.62 mg/dL (ref 0.44–1.00)
GFR calc Af Amer: >60 mL/min (ref >60)
GFR calc non Af Amer: >60 mL/min (ref >60)
Glucose, Bld: 89 mg/dL (ref 70–99)
Potassium: 2.8 mmol/L — ABNORMAL LOW (ref 3.5–5.1)
Sodium: 136 mmol/L (ref 135–145)
Total Bilirubin: 0.7 mg/dL (ref 0.3–1.2)
Total Protein: 6.2 g/dL — ABNORMAL LOW (ref 6.5–8.1)

## 2019-05-16 LAB — CBC
HCT: 27.4 % — ABNORMAL LOW (ref 36.0–46.0)
Hemoglobin: 8.4 g/dL — ABNORMAL LOW (ref 12.0–15.0)
MCH: 22 pg — ABNORMAL LOW (ref 26.0–34.0)
MCHC: 30.7 g/dL (ref 30.0–36.0)
MCV: 71.9 fL — ABNORMAL LOW (ref 80.0–100.0)
Platelets: 342 10*3/uL (ref 150–400)
RBC: 3.81 MIL/uL — ABNORMAL LOW (ref 3.87–5.11)
RDW: 15.9 % — ABNORMAL HIGH (ref 11.5–15.5)
WBC: 7.9 10*3/uL (ref 4.0–10.5)
nRBC: 0.3 % — ABNORMAL HIGH (ref 0.0–0.2)

## 2019-05-16 LAB — PROTEIN / CREATININE RATIO, URINE
Creatinine, Urine: 44.42 mg/dL
Protein Creatinine Ratio: 0.29 mg/mg{Cre} — ABNORMAL HIGH (ref 0.00–0.15)
Total Protein, Urine: 13 mg/dL

## 2019-05-16 MED ORDER — LACTATED RINGERS IV SOLN: INTRAVENOUS | Status: DC

## 2019-05-16 MED ORDER — NIFEDIPINE 10 MG PO CAPS
20.0000 mg | ORAL_CAPSULE | ORAL | Status: DC | PRN
  Administered 2019-05-16: 01:00:00 20 mg via ORAL
  Filled 2019-05-16: qty 2

## 2019-05-16 MED ORDER — NIFEDIPINE 10 MG PO CAPS
10.0000 mg | ORAL_CAPSULE | ORAL | Status: DC | PRN
  Administered 2019-05-16: 10 mg via ORAL
  Filled 2019-05-16: qty 1

## 2019-05-16 MED ORDER — NIFEDIPINE 10 MG PO CAPS: 20.0000 mg | ORAL_CAPSULE | ORAL | Status: DC | PRN

## 2019-05-16 MED ORDER — LABETALOL HCL 5 MG/ML IV SOLN: 40.0000 mg | INTRAVENOUS | Status: DC | PRN

## 2019-05-16 NOTE — MAU Provider Note (Signed)
Chief Complaint  Patient presents with  . Hypertension     First Provider Initiated Contact with Patient 05/16/19 0103      S: Eileen Miller  is a 23 y.o. y.o. year old [id number] female at [redacted]w[redacted]d weeks gestation who presents to MAU with elevated blood pressures. Reports Hx of chronic hypertension. Current blood pressure medication: labetalol 200 mg BID.  Ran out of her labetalol last week. Refilled yesterday morning after her ob appt where her BP was 170s/120s. Took both doses today. BP was 205/124 this evening.   Associated symptoms: Endorses Headache that went away after receiving BP medicine in MAU, denies vision changes, denies epigastric pain Contractions: denies Vaginal bleeding: denies Fetal movement: good  O:  Patient Vitals for the past 24 hrs:  BP Temp Temp src Pulse Resp Height Weight  05/16/19 0200 119/63 - - 90 - - -  05/16/19 0145 129/72 - - 97 - - -  05/16/19 0130 (!) 141/83 - - 99 - - -  05/16/19 0115 (!) 147/99 - - 88 - - -  05/16/19 0100 (!) 173/110 - - 64 - - -  05/16/19 0045 (!) 199/122 - - (!) 57 - - -  05/16/19 0030 (!) 188/123 - - 63 - - -  05/16/19 0007 (!) 182/117 98.3 F (36.8 C) Oral 62 20 5\' 7"  (1.702 m) 80.9 kg   General: NAD Heart: Regular rate Lungs: Normal rate and effort Abd: Soft, NT, Gravid, S=D Extremities: non pitting bilateral Pedal edema Neuro: 2+ deep tendon reflexes, No clonus  NST:  Baseline: 140 bpm, Variability: Good {> 6 bpm), Accelerations: Non-reactive but appropriate for gestational age and Decelerations: Absent  Results for orders placed or performed during the hospital encounter of 05/15/19 (from the past 24 hour(s))  Protein / creatinine ratio, urine     Status: Abnormal   Collection Time: 05/16/19 12:17 AM  Result Value Ref Range   Creatinine, Urine 44.42 mg/dL   Total Protein, Urine 13 mg/dL   Protein Creatinine Ratio 0.29 (H) 0.00 - 0.15 mg/mg[Cre]  Comprehensive metabolic panel     Status: Abnormal   Collection Time:  05/16/19 12:22 AM  Result Value Ref Range   Sodium 136 135 - 145 mmol/L   Potassium 2.8 (L) 3.5 - 5.1 mmol/L   Chloride 102 98 - 111 mmol/L   CO2 23 22 - 32 mmol/L   Glucose, Bld 89 70 - 99 mg/dL   BUN 9 6 - 20 mg/dL   Creatinine, Ser 07/16/19 0.44 - 1.00 mg/dL   Calcium 9.3 8.9 - 2.42 mg/dL   Total Protein 6.2 (L) 6.5 - 8.1 g/dL   Albumin 2.8 (L) 3.5 - 5.0 g/dL   AST 18 15 - 41 U/L   ALT 10 0 - 44 U/L   Alkaline Phosphatase 73 38 - 126 U/L   Total Bilirubin 0.7 0.3 - 1.2 mg/dL   GFR calc non Af Amer >60 >60 mL/min   GFR calc Af Amer >60 >60 mL/min   Anion gap 11 5 - 15  CBC     Status: Abnormal   Collection Time: 05/16/19 12:22 AM  Result Value Ref Range   WBC 7.9 4.0 - 10.5 K/uL   RBC 3.81 (L) 3.87 - 5.11 MIL/uL   Hemoglobin 8.4 (L) 12.0 - 15.0 g/dL   HCT 07/16/19 (L) 41.9 - 62.2 %   MCV 71.9 (L) 80.0 - 100.0 fL   MCH 22.0 (L) 26.0 - 34.0 pg   MCHC 30.7 30.0 -  36.0 g/dL   RDW 15.9 (H) 11.5 - 15.5 %   Platelets 342 150 - 400 K/uL   nRBC 0.3 (H) 0.0 - 0.2 %   MDM Severe range BPs. PEC labs ordered. Procardia antihypertensive protocol ordered  Pt reports resolution of headache after first dose of procardia given  BPs responded to oral procardia protocol after 2 doses PEC labs wnl Discussed patient with Dr. Rip Harbour. Recommends continue labetalol at current dosing now that she has refilled her meds.  Pt has appt in the office on Tuesday.     A:  1. Chronic hypertension during pregnancy, antepartum   2. [redacted] weeks gestation of pregnancy     P: Discharge home in stable condition per consult with Dr. Rip Harbour  Preeclampsia precautions. Keep scheduled appt at [facility] on Tuesday Return to maternity admissions as needed in [place], [place], NP 05/16/2019 1:03 AM

## 2019-05-16 NOTE — Discharge Instructions (Signed)
Hypertension During Pregnancy °High blood pressure (hypertension) is when the force of blood pumping through the arteries is too strong. Arteries are blood vessels that carry blood from the heart throughout the body. Hypertension during pregnancy can be mild or severe. Severe hypertension during pregnancy (preeclampsia) is a medical emergency that requires prompt evaluation and treatment. °Different types of hypertension can happen during pregnancy. These include: °· Chronic hypertension. This happens when you had high blood pressure before you became pregnant, and it continues during the pregnancy. Hypertension that develops before you are [redacted] weeks pregnant and continues during the pregnancy is also called chronic hypertension. If you have chronic hypertension, it will not go away after you have your baby. You will need follow-up visits with your health care provider after you have your baby. Your doctor may want you to keep taking medicine for your blood pressure. °· Gestational hypertension. This is hypertension that develops after the 20th week of pregnancy. Gestational hypertension usually goes away after you have your baby, but your health care provider will need to monitor your blood pressure to make sure that it is getting better. °· Preeclampsia. This is severe hypertension during pregnancy. This can cause serious complications for you and your baby and can also cause complications for you after the delivery of your baby. °· Postpartum preeclampsia. You may develop severe hypertension after giving birth. This usually occurs within 48 hours after childbirth but may occur up to 6 weeks after giving birth. This is rare. °How does this affect me? °Women who have hypertension during pregnancy have a greater chance of developing hypertension later in life or during future pregnancies. In some cases, hypertension during pregnancy can cause serious complications, such as: °· Stroke. °· Heart attack. °· Injury to  other organs, such as kidneys, lungs, or liver. °· Preeclampsia. °· Convulsions or seizures. °· Placental abruption. °How does this affect my baby? °Hypertension during pregnancy can affect your baby. Your baby may: °· Be born early (prematurely). °· Not weigh as much as he or she should at birth (low birth weight). °· Not tolerate labor well, leading to an unplanned cesarean delivery. °What are the risks? °There are certain factors that make it more likely for you to develop hypertension during pregnancy. These include: °· Having hypertension during a previous pregnancy. °· Being overweight. °· Being age 35 or older. °· Being pregnant for the first time. °· Being pregnant with more than one baby. °· Becoming pregnant using fertilization methods, such as IVF (in vitro fertilization). °· Having other medical problems, such as diabetes, kidney disease, or lupus. °· Having a family history of hypertension. °What can I do to lower my risk? °The exact cause of hypertension during pregnancy is not known. You may be able to lower your risk by: °· Maintaining a healthy weight. °· Eating a healthy and balanced diet. °· Following your health care provider's instructions about treating any long-term conditions that you had before becoming pregnant. °It is very important to keep all of your prenatal care appointments. Your health care provider will check your blood pressure and make sure that your pregnancy is progressing as expected. If a problem is found, early treatment can prevent complications. °How is this treated? °Treatment for hypertension during pregnancy varies depending on the type of hypertension you have and how serious it is. °· If you were taking medicine for high blood pressure before you became pregnant, talk with your health care provider. You may need to change medicine during pregnancy because   some medicines, like ACE inhibitors, may not be considered safe for your baby. °· If you have gestational  hypertension, your health care provider may order medicine to treat this during pregnancy. °· If you are at risk for preeclampsia, your health care provider may recommend that you take a low-dose aspirin during your pregnancy. °· If you have severe hypertension, you may need to be hospitalized so you and your baby can be monitored closely. You may also need to be given medicine to lower your blood pressure. This medicine may be given by mouth or through an IV. °· In some cases, if your condition gets worse, you may need to deliver your baby early. °Follow these instructions at home: °Eating and drinking ° °· Drink enough fluid to keep your urine pale yellow. °· Avoid caffeine. °Lifestyle °· Do not use any products that contain nicotine or tobacco, such as cigarettes, e-cigarettes, and chewing tobacco. If you need help quitting, ask your health care provider. °· Do not use alcohol or drugs. °· Avoid stress as much as possible. °· Rest and get plenty of sleep. °· Regular exercise can help to reduce your blood pressure. Ask your health care provider what kinds of exercise are best for you. °General instructions °· Take over-the-counter and prescription medicines only as told by your health care provider. °· Keep all prenatal and follow-up visits as told by your health care provider. This is important. °Contact a health care provider if: °· You have symptoms that your health care provider told you may require more treatment or monitoring, such as: °? Headaches. °? Nausea or vomiting. °? Abdominal pain. °? Dizziness. °? Light-headedness. °Get help right away if: °· You have: °? Severe abdominal pain that does not get better with treatment. °? A severe headache that does not get better. °? Vomiting that does not get better. °? Sudden, rapid weight gain. °? Sudden swelling in your hands, ankles, or face. °? Vaginal bleeding. °? Blood in your urine. °? Blurred or double vision. °? Shortness of breath or chest  pain. °? Weakness on one side of your body. °? Difficulty speaking. °· Your baby is not moving as much as usual. °Summary °· High blood pressure (hypertension) is when the force of blood pumping through the arteries is too strong. °· Hypertension during pregnancy can cause problems for you and your baby. °· Treatment for hypertension during pregnancy varies depending on the type of hypertension you have and how serious it is. °· Keep all prenatal and follow-up visits as told by your health care provider. This is important. °This information is not intended to replace advice given to you by your health care provider. Make sure you discuss any questions you have with your health care provider. °Document Released: 04/10/2011 Document Revised: 11/13/2018 Document Reviewed: 08/19/2018 °Elsevier Patient Education © 2020 [employer] ° °

## 2019-05-16 NOTE — MAU Note (Signed)
PT SAYS SHE WENT TO DR TODAY - 173/113- SHE TAKES BP MED - HAS NOT TAKEN X1 WEEK- REFILL TODAY .  HAS TAKEN  BID TODAY .  AT 930PM- 205/124.  1194-174/081. HAS H/A - ALL DAY .  VISION- SEES SPOTS . DENIES EPIGASTRIC PAIN .Marland Kitchen Harney- WITH [facility]

## 2019-05-22 ENCOUNTER — Other Ambulatory Visit: Payer: Self-pay

## 2019-05-22 ENCOUNTER — Inpatient Hospital Stay (HOSPITAL_COMMUNITY)
Admission: AD | Admit: 2019-05-22 | Discharge: 2019-06-03 | DRG: 784 | Disposition: A | Discharge status: Home / Self Care | Payer: Medicaid Other | Attending: Obstetrics and Gynecology | Admitting: Obstetrics and Gynecology

## 2019-05-22 ENCOUNTER — Encounter (HOSPITAL_COMMUNITY): Payer: Self-pay

## 2019-05-22 DIAGNOSIS — O321XX0 Maternal care for breech presentation, not applicable or unspecified: Secondary | ICD-10-CM | POA: Diagnosis present

## 2019-05-22 DIAGNOSIS — Z362 Encounter for other antenatal screening follow-up: Secondary | ICD-10-CM | POA: Diagnosis not present

## 2019-05-22 DIAGNOSIS — O113 Pre-existing hypertension with pre-eclampsia, third trimester: Secondary | ICD-10-CM | POA: Diagnosis not present

## 2019-05-22 DIAGNOSIS — O1002 Pre-existing essential hypertension complicating childbirth: Secondary | ICD-10-CM | POA: Diagnosis present

## 2019-05-22 DIAGNOSIS — E876 Hypokalemia: Secondary | ICD-10-CM | POA: Diagnosis not present

## 2019-05-22 DIAGNOSIS — O09213 Supervision of pregnancy with history of pre-term labor, third trimester: Secondary | ICD-10-CM | POA: Diagnosis not present

## 2019-05-22 DIAGNOSIS — O10919 Unspecified pre-existing hypertension complicating pregnancy, unspecified trimester: Secondary | ICD-10-CM

## 2019-05-22 DIAGNOSIS — O0990 Supervision of high risk pregnancy, unspecified, unspecified trimester: Secondary | ICD-10-CM

## 2019-05-22 DIAGNOSIS — O114 Pre-existing hypertension with pre-eclampsia, complicating childbirth: Principal | ICD-10-CM | POA: Diagnosis present

## 2019-05-22 DIAGNOSIS — O09299 Supervision of pregnancy with other poor reproductive or obstetric history, unspecified trimester: Secondary | ICD-10-CM

## 2019-05-22 DIAGNOSIS — O139 Gestational [pregnancy-induced] hypertension without significant proteinuria, unspecified trimester: Secondary | ICD-10-CM | POA: Diagnosis present

## 2019-05-22 DIAGNOSIS — Z3A29 29 weeks gestation of pregnancy: Secondary | ICD-10-CM

## 2019-05-22 DIAGNOSIS — Z9851 Tubal ligation status: Secondary | ICD-10-CM

## 2019-05-22 DIAGNOSIS — O1410 Severe pre-eclampsia, unspecified trimester: Secondary | ICD-10-CM

## 2019-05-22 DIAGNOSIS — O99013 Anemia complicating pregnancy, third trimester: Secondary | ICD-10-CM | POA: Diagnosis not present

## 2019-05-22 DIAGNOSIS — Z98891 History of uterine scar from previous surgery: Secondary | ICD-10-CM

## 2019-05-22 DIAGNOSIS — Z20828 Contact with and (suspected) exposure to other viral communicable diseases: Secondary | ICD-10-CM | POA: Diagnosis present

## 2019-05-22 DIAGNOSIS — D62 Acute posthemorrhagic anemia: Secondary | ICD-10-CM | POA: Diagnosis not present

## 2019-05-22 DIAGNOSIS — O9081 Anemia of the puerperium: Secondary | ICD-10-CM | POA: Diagnosis not present

## 2019-05-22 DIAGNOSIS — Z302 Encounter for sterilization: Secondary | ICD-10-CM

## 2019-05-22 DIAGNOSIS — O34211 Maternal care for low transverse scar from previous cesarean delivery: Secondary | ICD-10-CM | POA: Diagnosis present

## 2019-05-22 DIAGNOSIS — O34219 Maternal care for unspecified type scar from previous cesarean delivery: Secondary | ICD-10-CM | POA: Diagnosis not present

## 2019-05-22 LAB — COMPREHENSIVE METABOLIC PANEL
ALT: 11 U/L (ref 0–44)
AST: 19 U/L (ref 15–41)
Albumin: 2.7 g/dL — ABNORMAL LOW (ref 3.5–5.0)
Alkaline Phosphatase: 85 U/L (ref 38–126)
Anion gap: 9 (ref 5–15)
BUN: 9 mg/dL (ref 6–20)
CO2: 24 mmol/L (ref 22–32)
Calcium: 8.9 mg/dL (ref 8.9–10.3)
Chloride: 104 mmol/L (ref 98–111)
Creatinine, Ser: 0.76 mg/dL (ref 0.44–1.00)
GFR calc Af Amer: >60 mL/min (ref >60)
GFR calc non Af Amer: >60 mL/min (ref >60)
Glucose, Bld: 97 mg/dL (ref 70–99)
Potassium: 2.9 mmol/L — ABNORMAL LOW (ref 3.5–5.1)
Sodium: 137 mmol/L (ref 135–145)
Total Bilirubin: 0.4 mg/dL (ref 0.3–1.2)
Total Protein: 6 g/dL — ABNORMAL LOW (ref 6.5–8.1)

## 2019-05-22 LAB — CBC
HCT: 25.5 % — ABNORMAL LOW (ref 36.0–46.0)
Hemoglobin: 7.8 g/dL — ABNORMAL LOW (ref 12.0–15.0)
MCH: 22 pg — ABNORMAL LOW (ref 26.0–34.0)
MCHC: 30.6 g/dL (ref 30.0–36.0)
MCV: 72 fL — ABNORMAL LOW (ref 80.0–100.0)
Platelets: 315 10*3/uL (ref 150–400)
RBC: 3.54 MIL/uL — ABNORMAL LOW (ref 3.87–5.11)
RDW: 16.3 % — ABNORMAL HIGH (ref 11.5–15.5)
WBC: 6.4 10*3/uL (ref 4.0–10.5)
nRBC: 0.3 % — ABNORMAL HIGH (ref 0.0–0.2)

## 2019-05-22 LAB — SARS CORONAVIRUS 2 BY RT PCR (HOSPITAL ORDER, PERFORMED IN ~~LOC~~ HOSPITAL LAB): SARS Coronavirus 2: NEGATIVE

## 2019-05-22 LAB — TYPE AND SCREEN
ABO/RH(D): O POS
Antibody Screen: NEGATIVE

## 2019-05-22 LAB — ABO/RH: ABO/RH(D): O POS

## 2019-05-22 LAB — PROTEIN / CREATININE RATIO, URINE
Creatinine, Urine: 317.36 mg/dL
Protein Creatinine Ratio: 0.22 mg/mg{Cre} — ABNORMAL HIGH (ref 0.00–0.15)
Total Protein, Urine: 69 mg/dL

## 2019-05-22 MED ORDER — NIFEDIPINE 10 MG PO CAPS
10.0000 mg | ORAL_CAPSULE | ORAL | Status: DC | PRN
  Administered 2019-05-22: 13:00:00 10 mg via ORAL
  Filled 2019-05-22: qty 1

## 2019-05-22 MED ORDER — ACETAMINOPHEN 325 MG PO TABS
650.0000 mg | ORAL_TABLET | ORAL | Status: DC | PRN
  Administered 2019-05-22: 21:00:00 650 mg via ORAL
  Filled 2019-05-22 (×2): qty 2

## 2019-05-22 MED ORDER — NIFEDIPINE ER OSMOTIC RELEASE 30 MG PO TB24
60.0000 mg | ORAL_TABLET | Freq: Two times a day (BID) | ORAL | Status: DC
  Administered 2019-05-22 – 2019-05-31 (×18): 60 mg via ORAL
  Filled 2019-05-22 (×19): qty 2

## 2019-05-22 MED ORDER — CALCIUM CARBONATE ANTACID 500 MG PO CHEW: 2.0000 | CHEWABLE_TABLET | ORAL | Status: DC | PRN

## 2019-05-22 MED ORDER — DOCUSATE SODIUM 100 MG PO CAPS
100.0000 mg | ORAL_CAPSULE | Freq: Every day | ORAL | Status: DC
  Administered 2019-05-24 – 2019-05-26 (×3): 100 mg via ORAL
  Filled 2019-05-22 (×6): qty 1

## 2019-05-22 MED ORDER — LABETALOL HCL 5 MG/ML IV SOLN
40.0000 mg | INTRAVENOUS | Status: DC | PRN
  Administered 2019-05-24: 40 mg via INTRAVENOUS
  Filled 2019-05-22: qty 8

## 2019-05-22 MED ORDER — PRENATAL MULTIVITAMIN CH
1.0000 | ORAL_TABLET | Freq: Every day | ORAL | Status: DC
  Administered 2019-05-23 – 2019-05-31 (×8): 1 via ORAL
  Filled 2019-05-22 (×8): qty 1

## 2019-05-22 MED ORDER — ZOLPIDEM TARTRATE 5 MG PO TABS: 5.0000 mg | ORAL_TABLET | Freq: Every evening | ORAL | Status: DC | PRN

## 2019-05-22 MED ORDER — SODIUM CHLORIDE 0.9% FLUSH
3.0000 mL | Freq: Two times a day (BID) | INTRAVENOUS | Status: DC
  Administered 2019-05-22 – 2019-05-27 (×7): 3 mL via INTRAVENOUS

## 2019-05-22 MED ORDER — SODIUM CHLORIDE 0.9 % IV SOLN: 510.0000 mg | INTRAVENOUS | Status: DC

## 2019-05-22 MED ORDER — NIFEDIPINE 10 MG PO CAPS
20.0000 mg | ORAL_CAPSULE | ORAL | Status: DC | PRN
  Administered 2019-05-22 (×2): 20 mg via ORAL
  Filled 2019-05-22: qty 2

## 2019-05-22 MED ORDER — NIFEDIPINE 10 MG PO CAPS: 20.0000 mg | ORAL_CAPSULE | ORAL | Status: DC | PRN

## 2019-05-22 MED ORDER — BETAMETHASONE SOD PHOS & ACET 6 (3-3) MG/ML IJ SUSP
12.0000 mg | INTRAMUSCULAR | Status: AC
  Administered 2019-05-22 – 2019-05-23 (×2): 12 mg via INTRAMUSCULAR
  Filled 2019-05-22 (×2): qty 2

## 2019-05-22 MED ORDER — SODIUM CHLORIDE 0.9 % IV SOLN: 250.0000 mL | INTRAVENOUS | Status: DC | PRN

## 2019-05-22 MED ORDER — NIFEDIPINE 10 MG PO CAPS
10.0000 mg | ORAL_CAPSULE | ORAL | Status: DC | PRN
  Administered 2019-05-26: 21:00:00 10 mg via ORAL
  Filled 2019-05-22: qty 1

## 2019-05-22 MED ORDER — LABETALOL HCL 5 MG/ML IV SOLN
40.0000 mg | INTRAVENOUS | Status: DC | PRN
  Administered 2019-05-22: 40 mg via INTRAVENOUS
  Filled 2019-05-22: qty 8

## 2019-05-22 MED ORDER — SODIUM CHLORIDE 0.9 % IV SOLN
200.0000 mg | Freq: Once | INTRAVENOUS | Status: AC
  Administered 2019-05-22: 200 mg via INTRAVENOUS
  Filled 2019-05-22: qty 10

## 2019-05-22 MED ORDER — SODIUM CHLORIDE 0.9% FLUSH
3.0000 mL | INTRAVENOUS | Status: DC | PRN
  Administered 2019-05-25: 3 mL via INTRAVENOUS
  Filled 2019-05-22: qty 3

## 2019-05-22 MED ORDER — NIFEDIPINE 10 MG PO CAPS
20.0000 mg | ORAL_CAPSULE | ORAL | Status: DC | PRN
  Filled 2019-05-22: qty 2

## 2019-05-22 NOTE — MAU Provider Note (Signed)
Chief Complaint  Patient presents with  . Hypertension  . Blurred Vision     First Provider Initiated Contact with Patient 05/22/19 1340      S: Eileen Miller  is a 23 y.o. y.o. year old [id number] female at [redacted]w[redacted]d weeks gestation who presents to MAU with elevated blood pressures. Hx of chronic hypertension & recently diagnosed with superimposed preeclampsia. Current blood pressure medication: labetalol 200 mg BID, last dose was this morning.   Associated symptoms: denies Headache, endorses vision changes since yesterday, denies epigastric pain Contractions: denies Vaginal bleeding: denies Fetal movement: good  O:  Patient Vitals for the past 24 hrs:  BP Temp Temp src Pulse Resp SpO2 Height Weight  05/22/19 1445 (!) 146/95 - - 83 - 99 % - -  05/22/19 1430 (!) 152/97 - - 91 - 100 % - -  05/22/19 1415 (!) 160/104 - - 81 - 100 % - -  05/22/19 1400 (!) 158/110 - - 73 - 100 % - -  05/22/19 1350 (!) 168/112 - - 80 - 100 % - -  05/22/19 1340 (!) 178/116 - - 85 - 100 % - -  05/22/19 1328 (!) 166/117 - - 67 - - - -  05/22/19 1308 (!) 163/117 - - 75 - - - -  05/22/19 1244 (!) 167/120 98.7 F (37.1 C) Oral 77 18 99 % 5\' 7"  (1.702 m) 82 kg   General: NAD Heart: Regular rate Lungs: Normal rate and effort Abd: Soft, NT, Gravid, S=D Extremities: non pitting Pedal edema Neuro: 2+ deep tendon reflexes, No clonus  NST:  Baseline: 140 bpm, Variability: Good {> 6 bpm), Accelerations: Non-reactive but appropriate for gestational age and Decelerations: Absent  Results for orders placed or performed during the hospital encounter of 05/22/19 (from the past 24 hour(s))  Protein / creatinine ratio, urine     Status: Abnormal   Collection Time: 05/22/19 12:29 PM  Result Value Ref Range   Creatinine, Urine 317.36 mg/dL   Total Protein, Urine 69 mg/dL   Protein Creatinine Ratio 0.22 (H) 0.00 - 0.15 mg/mg[Cre]  Type and screen     Status: None   Collection Time: 05/22/19  1:25 PM  Result Value Ref  Range   ABO/RH(D) O POS    Antibody Screen NEG    Sample Expiration      05/25/2019,2359 Performed at [facility], [address] [place], [place] [address]   ABO/Rh     Status: None   Collection Time: 05/22/19  1:25 PM  Result Value Ref Range   ABO/RH(D)      O POS Performed at [facility] [address] [place], [place] [address]   Comprehensive metabolic panel     Status: Abnormal   Collection Time: 05/22/19  1:27 PM  Result Value Ref Range   Sodium 137 135 - 145 mmol/L   Potassium 2.9 (L) 3.5 - 5.1 mmol/L   Chloride 104 98 - 111 mmol/L   CO2 24 22 - 32 mmol/L   Glucose, Bld 97 70 - 99 mg/dL   BUN 9 6 - 20 mg/dL   Creatinine, Ser 0.76 0.44 - 1.00 mg/dL   Calcium 8.9 8.9 - 10.3 mg/dL   Total Protein 6.0 (L) 6.5 - 8.1 g/dL   Albumin 2.7 (L) 3.5 - 5.0 g/dL   AST 19 15 - 41 U/L   ALT 11 0 - 44 U/L   Alkaline Phosphatase 85 38 - 126 U/L   Total  Bilirubin 0.4 0.3 - 1.2 mg/dL   GFR calc non Af Amer >60 >60 mL/min   GFR calc Af Amer >60 >60 mL/min   Anion gap 9 5 - 15  CBC     Status: Abnormal   Collection Time: 05/22/19  1:27 PM  Result Value Ref Range   WBC 6.4 4.0 - 10.5 K/uL   RBC 3.54 (L) 3.87 - 5.11 MIL/uL   Hemoglobin 7.8 (L) 12.0 - 15.0 g/dL   HCT 82.9 (L) 56.2 - 13.0 %   MCV 72.0 (L) 80.0 - 100.0 fL   MCH 22.0 (L) 26.0 - 34.0 pg   MCHC 30.6 30.0 - 36.0 g/dL   RDW 86.5 (H) 78.4 - 69.6 %   Platelets 315 150 - 400 K/uL   nRBC 0.3 (H) 0.0 - 0.2 %    A: [redacted]w[redacted]d week IUP chronic hypertension with superimposed preeclampsia FHR reactive  P:  Admit to OBSC unit per consult with Sherian Rein, MD. Start procardia 60 mg XL BID CEFM/TOCO covid swab pending  Judeth Horn, NP 05/22/2019 1:40 PM

## 2019-05-22 NOTE — H&P (Signed)
Eileen Miller is a 23 y.o. female [phone] at 29wk with CHTN and superimposed PreEclampsia.  Questionable compliance w medical management.  Recenetly seen and hadn't taken meds for about a week, restarted, short term follow-up and admission, Next appointment with lab results revealing superimposed PreE and BP elevated, sent with 400mg  bid of Labetalol.  Returned w PreE sx's only taking 200mg  bid labetalol.  Pt also needs IV iron.  Hgb 7.8, nl platelets, Cr and LFT on labs.  PCR in office 0.48, 0.22 at hospital.  Pt also endorses HA, blurry vision and scotomata.  BP treated in MAU with procardia PIH protocol.  Started on Procardia XL 60mg  bid.  Pregnancy dated by LMP cw [place].  Also decreased potassium was taking Klor-con.  H/o LTCS x 2 w PIH at 33 and 35 weeks in previous pregnancies.  Knows will need to deliver via LTCS.  Will get feraheme as inpatient.  Also [place] for growth.  Close BP monitoring and monitoring symptoms.  Also BMZ and NICU consult  OB History    Gravida  4   Para  2   Term  0   Preterm  2   AB  1   Living  2     SAB  0   TAB  1   Ectopic  0   Multiple  0   Live Births  2         G1 35 wk LTCS 5#10 female G2 33wk LTCS 3#7 female G4 present  No abn pap No STD Past Medical History:  Diagnosis Date  . History of cesarean section 03/03/2016   For severe Preeclampsia. Did not labor.   . History of preterm delivery 03/03/2016   For severe Preeclampsia. Did not labor.   . Hypertension   . Iron deficiency anemia due to chronic blood loss 08/21/2018  . Severe preeclampsia 2015   Past Surgical History:  Procedure Laterality Date  . CESAREAN SECTION  2015  . CESAREAN SECTION N/A 10/20/2017   Procedure: CESAREAN SECTION;  Surgeon: Florian Buff, MD;  Location: [facility];  Service: Obstetrics;  Laterality: N/A;  . EYE SURGERY    . HERNIA REPAIR     Family History: family history includes Hypertension in her father, maternal grandmother, and paternal  grandmother. Social History:  reports that she has never smoked. She has never used smokeless tobacco. She reports previous alcohol use. 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   HENT: Negative.   Eyes: Positive for blurred vision.       Occ scotomata  Respiratory: Negative.   Cardiovascular: Negative.   Gastrointestinal: Negative.   Genitourinary: Negative.   Musculoskeletal: Positive for back pain.  Skin: Negative.   Neurological: Positive for headaches.  Psychiatric/Behavioral: Negative.    Maternal Medical History:  Reason for admission: CHTN with superimposed PreEclampsia - uncontrolled BP and HA/blurry vision.  Questionable compliance w meds  Fetal activity: Perceived fetal activity is normal.    Prenatal complications: PIH and pre-eclampsia.   Prenatal Complications - Diabetes: none.      Blood pressure (!) 143/93, pulse 89, temperature 98.2  F (36.8 C), temperature source Oral, resp. rate 18, height 5\' 7"  (1.702 m), weight 82 kg, last menstrual period 10/30/2018, SpO2 100 %, unknown if currently breastfeeding. Maternal Exam:  Abdomen: Patient reports no abdominal tenderness. Fundal height is appropriate for gestation.    Introitus: Normal vulva. Normal vagina.    Physical Exam  Constitutional: She is oriented to person, place, and time. She appears well-developed and well-nourished.  HENT:  Head: Normocephalic and atraumatic.  Cardiovascular: Normal rate and regular rhythm.  Respiratory: Effort normal and breath sounds normal. No respiratory  distress. She has no wheezes.  GI: Soft. Bowel sounds are normal. She exhibits no distension. There is no abdominal tenderness.  Genitourinary:    Vulva normal.   Musculoskeletal: Normal range of motion.  Neurological: She is alert and oriented to person, place, and time.  Skin: Skin is warm and dry.  Psychiatric: She has a normal mood and affect. Her behavior is normal.    Prenatal labs: ABO, Rh: --/--/O POS, O POS Performed at [facility], [address], [place], [place]  [phone]) Antibody: NEG (10/16 1325) Rubella:  immune RPR:   NR HBsAg:   neg HIV: Non Reactive (01/16 2250)  GBS:   unknown  Hgb 9.0-8.5-8.0-7.8/Plt 421/UrCx neg/Chl neg/GC neg/ Varicella imune/ Rubella immune/ Pap WNL/ glucola 107/ dated by LMP cw  US/ nl AFP/ good CL, good growth, nl AFI,  Nl aat, ant plac, female  Tdap and Flu 10/14  Assessment/Plan: 23yo 11/14 at 29wk with CHTN and SIPE Admit for BP and symptom control - Procardia XL 60mg  bid BMZ for lung maturity' Pt desires BTL will make sure papers signed.   MFM [id number] for growth NICU consult - MD called feraheme  Natalyia Innes Bovard-Stuckert 05/22/2019, 10:00 PM

## 2019-05-22 NOTE — MAU Note (Signed)
Sent over from office, BP elevated, pt is on Labetalol, took morning dose.  No HA currently, vision is blurry, denies epigastric pain or increase in swelling.

## 2019-05-23 ENCOUNTER — Inpatient Hospital Stay (HOSPITAL_COMMUNITY): Payer: Medicaid Other

## 2019-05-23 DIAGNOSIS — O34219 Maternal care for unspecified type scar from previous cesarean delivery: Secondary | ICD-10-CM

## 2019-05-23 DIAGNOSIS — O113 Pre-existing hypertension with pre-eclampsia, third trimester: Secondary | ICD-10-CM

## 2019-05-23 DIAGNOSIS — Z3A29 29 weeks gestation of pregnancy: Secondary | ICD-10-CM

## 2019-05-23 DIAGNOSIS — Z362 Encounter for other antenatal screening follow-up: Secondary | ICD-10-CM

## 2019-05-23 DIAGNOSIS — O09213 Supervision of pregnancy with history of pre-term labor, third trimester: Secondary | ICD-10-CM

## 2019-05-23 DIAGNOSIS — O99013 Anemia complicating pregnancy, third trimester: Secondary | ICD-10-CM

## 2019-05-23 LAB — RPR: RPR Ser Ql: NONREACTIVE

## 2019-05-23 MED ORDER — LABETALOL HCL 200 MG PO TABS
300.0000 mg | ORAL_TABLET | Freq: Two times a day (BID) | ORAL | Status: DC
  Administered 2019-05-23 – 2019-05-25 (×5): 300 mg via ORAL
  Filled 2019-05-23 (×5): qty 1

## 2019-05-23 MED ORDER — LABETALOL HCL 200 MG PO TABS: 300.0000 mg | ORAL_TABLET | Freq: Two times a day (BID) | ORAL | Status: DC

## 2019-05-23 MED ORDER — SODIUM CHLORIDE 0.9 % IV SOLN
400.0000 mg | INTRAVENOUS | Status: AC
  Administered 2019-05-25: 08:00:00 400 mg via INTRAVENOUS
  Filled 2019-05-23 (×2): qty 20

## 2019-05-23 MED ORDER — LABETALOL HCL 200 MG PO TABS
200.0000 mg | ORAL_TABLET | Freq: Two times a day (BID) | ORAL | Status: DC
  Administered 2019-05-23: 10:00:00 200 mg via ORAL
  Filled 2019-05-23: qty 1

## 2019-05-23 NOTE — Progress Notes (Addendum)
Patient ID: Eileen Miller, female   DOB: 1996/07/18, 23 y.o.   MRN: [id number]   PreEclampsia with severe features, CHTN  No c/o's.  +FM, no LOF, no VB, no ctx; occ HA, blurry vision.  Adjusting BP meds.    AF 135-158/86-108  gen NAD Abd soft, gravid, NT FHT 120-130's, mod var, + accel, category 1 toco occ  Currently on Procardia XL 60 bid, will add labetalol 200 bid GBBS P NICU consult P MFM [place] today

## 2019-05-23 NOTE — Consult Note (Signed)
[place] 05/23/2019    9:16 AM  Neonatal Medicine Consultation         Eileen Miller          MRN:  [id number]  I was called at the request of the patient's obstetrician (Dr. Melba Coon) to speak to this patient due to potential preterm delivery as early as 60 weeks.  The patient's prenatal course includes chronic hypertension with superimposed preeclampsia.  She is 29 3/7 weeks currently.  She is admitted to [facility] unit, and is receiving treatment that includes betamethasone and antihypertensive medication.  The baby is a boy.  She has had 2 premature children (35 and 33 weeks).  I reviewed expectations for a baby born at 41+ weeks, including survival, length of stay, morbidities such as respiratory distress, feeding intolerance.  I described how we provide respiratory and feeding support.  She was very familiar with these topics due to her experience with the other children.    I spent 20 minutes reviewing the record, speaking to the patient, and entering appropriate documentation.  More than 50% of the time was spent face to face with patient.   _____________________ Roosevelt Locks, MD Attending Neonatologist

## 2019-05-23 NOTE — Progress Notes (Signed)
Pt's BP 145/103. Morning Procardia given. Will continue to monitor. Toya Smothers, RN

## 2019-05-24 LAB — COMPREHENSIVE METABOLIC PANEL
ALT: 11 U/L (ref 0–44)
AST: 21 U/L (ref 15–41)
Albumin: 2.6 g/dL — ABNORMAL LOW (ref 3.5–5.0)
Alkaline Phosphatase: 79 U/L (ref 38–126)
Anion gap: 11 (ref 5–15)
BUN: 11 mg/dL (ref 6–20)
CO2: 22 mmol/L (ref 22–32)
Calcium: 8.7 mg/dL — ABNORMAL LOW (ref 8.9–10.3)
Chloride: 105 mmol/L (ref 98–111)
Creatinine, Ser: 0.67 mg/dL (ref 0.44–1.00)
GFR calc Af Amer: >60 mL/min (ref >60)
GFR calc non Af Amer: >60 mL/min (ref >60)
Glucose, Bld: 112 mg/dL — ABNORMAL HIGH (ref 70–99)
Potassium: 2.9 mmol/L — ABNORMAL LOW (ref 3.5–5.1)
Sodium: 138 mmol/L (ref 135–145)
Total Bilirubin: 0.7 mg/dL (ref 0.3–1.2)
Total Protein: 6.3 g/dL — ABNORMAL LOW (ref 6.5–8.1)

## 2019-05-24 LAB — CBC
HCT: 26.4 % — ABNORMAL LOW (ref 36.0–46.0)
Hemoglobin: 7.9 g/dL — ABNORMAL LOW (ref 12.0–15.0)
MCH: 21.5 pg — ABNORMAL LOW (ref 26.0–34.0)
MCHC: 29.9 g/dL — ABNORMAL LOW (ref 30.0–36.0)
MCV: 71.7 fL — ABNORMAL LOW (ref 80.0–100.0)
Platelets: 309 10*3/uL (ref 150–400)
RBC: 3.68 MIL/uL — ABNORMAL LOW (ref 3.87–5.11)
RDW: 16.8 % — ABNORMAL HIGH (ref 11.5–15.5)
WBC: 11.1 10*3/uL — ABNORMAL HIGH (ref 4.0–10.5)
nRBC: 1.3 % — ABNORMAL HIGH (ref 0.0–0.2)

## 2019-05-24 MED ORDER — POTASSIUM CHLORIDE CRYS ER 20 MEQ PO TBCR
20.0000 meq | EXTENDED_RELEASE_TABLET | Freq: Two times a day (BID) | ORAL | Status: DC
  Administered 2019-05-24 – 2019-05-25 (×3): 20 meq via ORAL
  Filled 2019-05-24 (×3): qty 1

## 2019-05-24 NOTE — Progress Notes (Signed)
Dr. Melba Coon notified of extended fetal monitoring due to 2 prolonged decelerations noted on strip so far. Otherwise, strip is reassuring with good variability. MD said to keep baby monitored until she is able to evaluate the strip.

## 2019-05-24 NOTE — Progress Notes (Signed)
Pt's HR up to low 150s intermittently. Upon rounding on patient, pt states, "I am angry. 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 Heart rate trending back down, currently at 110.

## 2019-05-24 NOTE — Progress Notes (Signed)
Patient ID: Eileen Miller, female   DOB: [birth year], 23 y.o.   MRN: [id number]   PreEclampsia with severe features CHTN  +FM, no LOF, no VB, no ctx.  Some prolonged decels.  Procardia XL 60 bid, labetalol 300mg  bid  AF  138-154/ 86-103 (18/86) gen NAD FHTs 120-140's mod var, recent prolonged to 90's, good variability through.  + accels - will continue w continuous monitoring toco none  Continue current BP management MFM [place] - breech presentation, R lateral plac, nl AFI, good growth 3#3, 55%, EDC 08/06/19, nl anat Appreciate NICU input Will follow CBC and CMP twice weekly Continue continuous EFM  Receive venovir for anemia, will receive several other doses.

## 2019-05-24 NOTE — Progress Notes (Signed)
Dr. Sandford Craze notified of 4 min prolong decel down to the 90's with return to baseline of 135. Variability moderate. No contractions noted.

## 2019-05-24 NOTE — Progress Notes (Signed)
Patient ID: Eileen Miller, female   DOB: 09/23/1995, 23 y.o.   MRN: [id number]  CHT, SIPE - uncontrolled BP Procardia 60mg  bid, Labetalol 300mg  bid Has received BMZ  Was on continuous monitoring for some prolonged variable /decel.  Mainly FHT 120-150 (mostly 130-145 lately), mod var, + accel, category 1 toco irr  Will change back to q hift monitoring.

## 2019-05-25 LAB — COMPREHENSIVE METABOLIC PANEL
ALT: 12 U/L (ref 0–44)
AST: 22 U/L (ref 15–41)
Albumin: 2.5 g/dL — ABNORMAL LOW (ref 3.5–5.0)
Alkaline Phosphatase: 71 U/L (ref 38–126)
Anion gap: 10 (ref 5–15)
BUN: 9 mg/dL (ref 6–20)
CO2: 23 mmol/L (ref 22–32)
Calcium: 8.4 mg/dL — ABNORMAL LOW (ref 8.9–10.3)
Chloride: 104 mmol/L (ref 98–111)
Creatinine, Ser: 0.61 mg/dL (ref 0.44–1.00)
GFR calc Af Amer: >60 mL/min (ref >60)
GFR calc non Af Amer: >60 mL/min (ref >60)
Glucose, Bld: 92 mg/dL (ref 70–99)
Potassium: 2.7 mmol/L — CL (ref 3.5–5.1)
Sodium: 137 mmol/L (ref 135–145)
Total Bilirubin: 0.7 mg/dL (ref 0.3–1.2)
Total Protein: 5.8 g/dL — ABNORMAL LOW (ref 6.5–8.1)

## 2019-05-25 LAB — CULTURE, BETA STREP (GROUP B ONLY)

## 2019-05-25 MED ORDER — POTASSIUM CHLORIDE CRYS ER 20 MEQ PO TBCR
40.0000 meq | EXTENDED_RELEASE_TABLET | Freq: Three times a day (TID) | ORAL | Status: DC
  Administered 2019-05-25 – 2019-05-26 (×5): 40 meq via ORAL
  Filled 2019-05-25 (×6): qty 2

## 2019-05-25 NOTE — Progress Notes (Signed)
Patient ID: Eileen Miller, female   DOB: 1996-01-07, 23 y.o.   MRN: [id number] Patient ID: Eileen Miller, female   DOB: [birth year], 23 y.o.   MRN: [id number] HD#3 29 4/7 weeks Chronic hypertension with exacerbation and possible superimposed preeclampsia  Pt had a good day, feels well FHR category 1  -S/p feraheme infusion, finished this PM -Increased K-dur for hypokalemia to 16mEQ TID will recheck in AM -BP elevated, but not severe-will see how runs this PM and decide if need to increase labetalol further -Pt lives [place] min away, works from home

## 2019-05-25 NOTE — Progress Notes (Addendum)
Patient ID: Eileen Miller, female   DOB: 1996-04-30, 23 y.o.   MRN: [id number] HD#3 29 4/7 weeks Chronic hypertension with exacerbation and possible superimposed preeclampsia  Pt sitting up eating breakfast and states feels well.  No HA this AM Good FM  BP 138-161/86-103  FHR category 1 last  PM  Gravid NT  BP reasonably controlled on Procardia 60mg  po BID and labetalol 300mg  po BID Labs WNL yesterday, will repeat tomorrow AM LID:CVUDT ratio in hospital 0.22, 0.48 in office (baseline 0.13) MFM [place] with EFW 55%ile, breech Feraheme for anemia today D/w pt will monitor BP today S/p betamethasone x 2 and NICU consult For repeat c-section Potassium still low at 2.7, increase K-dur to 40 mEq TID

## 2019-05-26 ENCOUNTER — Ambulatory Visit (HOSPITAL_COMMUNITY): Payer: Medicaid Other

## 2019-05-26 LAB — CBC
HCT: 27 % — ABNORMAL LOW (ref 36.0–46.0)
Hemoglobin: 8.3 g/dL — ABNORMAL LOW (ref 12.0–15.0)
MCH: 22 pg — ABNORMAL LOW (ref 26.0–34.0)
MCHC: 30.7 g/dL (ref 30.0–36.0)
MCV: 71.6 fL — ABNORMAL LOW (ref 80.0–100.0)
Platelets: 290 10*3/uL (ref 150–400)
RBC: 3.77 MIL/uL — ABNORMAL LOW (ref 3.87–5.11)
RDW: 17.5 % — ABNORMAL HIGH (ref 11.5–15.5)
WBC: 9.6 10*3/uL (ref 4.0–10.5)
nRBC: 3.9 % — ABNORMAL HIGH (ref 0.0–0.2)

## 2019-05-26 MED ORDER — LABETALOL HCL 200 MG PO TABS
300.0000 mg | ORAL_TABLET | Freq: Three times a day (TID) | ORAL | Status: DC
  Administered 2019-05-26 (×2): 300 mg via ORAL
  Filled 2019-05-26 (×2): qty 1

## 2019-05-26 MED ORDER — LABETALOL HCL 200 MG PO TABS
400.0000 mg | ORAL_TABLET | Freq: Three times a day (TID) | ORAL | Status: DC
  Administered 2019-05-26 – 2019-06-01 (×16): 400 mg via ORAL
  Filled 2019-05-26 (×18): qty 2

## 2019-05-26 NOTE — Progress Notes (Signed)
HD #5, [redacted]W[redacted]D, CHTN with superimposed preeclampsia, hypokalemia Feels fine today, +FM Afeb, VSS, BP 130-150/90-100 FHT appropriate for 29 weeks CMP pending CBC with Hgb up to 8.3, plt 290k Will continue Procardia XL 60 mg bid, labetalol 300 mg tid, monitor BP, advised might need to deliver if BP continues to rise.  Continue K+ replacement

## 2019-05-26 NOTE — Progress Notes (Signed)
Patient ID: Eileen Miller, female   DOB: [birth year], 23 y.o.   MRN: [id number] BP elevated to over 150's overnight so increased labetalol to 300mg  po TID and continuing Procardia XL 60mg  po BID Labs from this AM pending

## 2019-05-26 NOTE — Progress Notes (Signed)
Feeling ok Afeb, VSS, BP 150/90-100 FHT good for 29 weeks Will increase labetalol to 400 mg tid, recheck CMP in am

## 2019-05-27 LAB — COMPREHENSIVE METABOLIC PANEL
ALT: 15 U/L (ref 0–44)
AST: 25 U/L (ref 15–41)
Albumin: 2.6 g/dL — ABNORMAL LOW (ref 3.5–5.0)
Alkaline Phosphatase: 90 U/L (ref 38–126)
Anion gap: 9 (ref 5–15)
BUN: 13 mg/dL (ref 6–20)
CO2: 22 mmol/L (ref 22–32)
Calcium: 9 mg/dL (ref 8.9–10.3)
Chloride: 107 mmol/L (ref 98–111)
Creatinine, Ser: 0.63 mg/dL (ref 0.44–1.00)
GFR calc Af Amer: >60 mL/min (ref >60)
GFR calc non Af Amer: >60 mL/min (ref >60)
Glucose, Bld: 88 mg/dL (ref 70–99)
Potassium: 3.7 mmol/L (ref 3.5–5.1)
Sodium: 138 mmol/L (ref 135–145)
Total Bilirubin: 0.6 mg/dL (ref 0.3–1.2)
Total Protein: 6.3 g/dL — ABNORMAL LOW (ref 6.5–8.1)

## 2019-05-27 LAB — CBC
HCT: 28.6 % — ABNORMAL LOW (ref 36.0–46.0)
Hemoglobin: 8.5 g/dL — ABNORMAL LOW (ref 12.0–15.0)
MCH: 21.6 pg — ABNORMAL LOW (ref 26.0–34.0)
MCHC: 29.7 g/dL — ABNORMAL LOW (ref 30.0–36.0)
MCV: 72.8 fL — ABNORMAL LOW (ref 80.0–100.0)
Platelets: 313 10*3/uL (ref 150–400)
RBC: 3.93 MIL/uL (ref 3.87–5.11)
RDW: 18 % — ABNORMAL HIGH (ref 11.5–15.5)
WBC: 8.6 10*3/uL (ref 4.0–10.5)
nRBC: 4.1 % — ABNORMAL HIGH (ref 0.0–0.2)

## 2019-05-27 LAB — TYPE AND SCREEN
ABO/RH(D): O POS
Antibody Screen: NEGATIVE

## 2019-05-27 NOTE — Progress Notes (Signed)
Patient doing well this ev3ening. Denies PreE symptoms. NSt running, category 1 tracing. Plan to repeat Bps q2hrs this evening. SBPs improved, DBPs now in 40s. Continue to monitor, hospital BTL papers signed today. Repeat CBC in AM given today's Venofer

## 2019-05-27 NOTE — Progress Notes (Signed)
S: Patient feeling well this AM. Denies HA, RUQ pain, LE edema, visual changes, SOB, N/V. +FM, denies VB, LOF, CTX.   O:BP (!) 159/106   Pulse 95   Temp 98.8 F (37.1 C) (Oral)   Resp 18   Ht 5\' 7"  (1.702 m)   Wt 82 kg   LMP 10/30/2018 (Exact Date)   SpO2 100%   BMI 28.30 kg/m  Gen: AAF in NAD CV: CTAB, RRR Abd: Gravid, NTTP, FH c/w EGA    A/P: This is a 23yo [id number] @ 29 6/7 by 16wk scan admitted with [facility] on Rx with SI PreE. PNC c/b h/o PTD x2 via LTCSx2, both with PreE; hypokalemia, anemia  1) CHTN w/ SI PreE -On Labetalol 400mg  TID plus Procardia 60XL BID.AM BP 159/106. Has required medication increases since admission, most recently to 400mg  last night. -Last emergency Rx PO Nif 10mg  @ 0026 -If persistently severe range requiring multiple doses of emergency antihypertensives or new onset of PreE symptoms, will move forward with RTLCS+BTL and initiate MgSO4 at that time -GS by MFM: 10/17: breech presentation, R lateral plac, nl AFI, good growth 3#3, 55%, EDC 08/06/19, nl anat -Cr stable, AST/ALT 21/11>25/15, plt stable  2) Hypokalemia - 2.7 on admission -On PO supplementation at admission (20 BID, then 40 TID) -K 3.7 this AM, PO meds D/C'ed  3) Anemia: chronic, was on PO at home -7.8/25.5 on admission -S/p Venofer x1, 2nd dose this AM, AM CBC 8.5/28.6 this AM -Plan for repeat T&S today (order expired), will need 2upRBC on call for section if needed  3) IUP @ 29 6/7 -S/p NICU consult, MFM scan -s/p BMTZ course 10/16-17 -GBS neg -NST TID  R/B/A of cesarean section discussed with patient. Alternative would be vaginal delivery which would mean shorter postpartum stay and decreased risk of bleeding. Risks of section include infection of the uterus, pelvic organs, or skin, inadvertent injury to internal organs, such as bowel or bladder. If there is major injury, extensive surgery may be required. If injury is minor, it may be treated with relative ease. Discussed possibility  of excessive blood loss and transfusion. If bleeding cannot be controlled using medical or minor surgical methods, a cesarean hysterectomy may be performed which would mean no future fertility. Patient accepts the possibility of blood transfusion, if necessary. Patient understands. Reviewed RBA of BTL. Alternatives include LARCs, POPs. Patient with satisfied fertility. Reviewed that approx 3% chance of ectopic pregnancy post-tubal procedure should pregnancy occur (increased from baseline) and to notify providers if early pregnancy symptoms occur. Surgical RBA same as in C-section. Patient understands and would like to sign forms for tubal ligation at this time

## 2019-05-28 LAB — CBC WITH DIFFERENTIAL/PLATELET
Abs Immature Granulocytes: 0.18 10*3/uL — ABNORMAL HIGH (ref 0.00–0.07)
Basophils Absolute: 0.1 10*3/uL (ref 0.0–0.1)
Basophils Relative: 1 %
Eosinophils Absolute: 0.2 10*3/uL (ref 0.0–0.5)
Eosinophils Relative: 2 %
HCT: 27 % — ABNORMAL LOW (ref 36.0–46.0)
Hemoglobin: 8.1 g/dL — ABNORMAL LOW (ref 12.0–15.0)
Immature Granulocytes: 2 %
Lymphocytes Relative: 23 %
Lymphs Abs: 2.1 10*3/uL (ref 0.7–4.0)
MCH: 21.9 pg — ABNORMAL LOW (ref 26.0–34.0)
MCHC: 30 g/dL (ref 30.0–36.0)
MCV: 73 fL — ABNORMAL LOW (ref 80.0–100.0)
Monocytes Absolute: 0.5 10*3/uL (ref 0.1–1.0)
Monocytes Relative: 6 %
Neutro Abs: 5.9 10*3/uL (ref 1.7–7.7)
Neutrophils Relative %: 66 %
Platelets: 279 10*3/uL (ref 150–400)
RBC: 3.7 MIL/uL — ABNORMAL LOW (ref 3.87–5.11)
RDW: 18.7 % — ABNORMAL HIGH (ref 11.5–15.5)
WBC: 8.9 10*3/uL (ref 4.0–10.5)
nRBC: 2.6 % — ABNORMAL HIGH (ref 0.0–0.2)

## 2019-05-28 NOTE — Progress Notes (Signed)
Initial Nutrition Assessment  DOCUMENTATION CODES:  Not applicable  INTERVENTION:  Regular Diet Snacks and double protein portions upon request  NUTRITION DIAGNOSIS:  Increased nutrient needs related to (pregnancy and fetal growth requirements) as evidenced by (30 weeks IUP). GOAL:  Patient will meet greater than or equal to 90% of their needs, Weight gain MONITOR:  Weight trends REASON FOR ASSESSMENT:  Antenatal   ASSESSMENT:  30 weeks, adm with PEC. Pre-preg wt 77.8 kg, BMI 26.8. 9 lb weight gain. On PNV, Feraheme. 10/22 Hct 27 %   Diet Order:   Diet Order            Diet regular Room service appropriate? Yes; Fluid consistency: Thin  Diet effective now              EDUCATION NEEDS:   No education needs have been identified at this time  Skin:  Skin Assessment: Reviewed RN Assessment  Height:   Ht Readings from Last 1 Encounters:  05/22/19 5\' 7"  (1.702 m)    Weight:   Wt Readings from Last 1 Encounters:  05/22/19 82 kg    Ideal Body Weight:   135 lbs/ 61.3 kg  BMI:  Body mass index is 28.3 kg/m.  Estimated Nutritional Needs:   Kcal:  1950-2150  Protein:  90-100 g  Fluid:  2.2L    Weyman Rodney M.Fredderick Severance LDN Neonatal Nutrition Support Specialist/RD III Pager [phone]      Phone [phone]

## 2019-05-28 NOTE — Progress Notes (Signed)
Patient ID: Eileen Miller, female   DOB: [birth year], 23 y.o.   MRN: [id number]   Pt with CHTN with superimposed PreE with severe features, 30week  +FM, no LOF, no VB, no ctx, no PIH sx's  AF 139-153/93-104 gen NAD Abd soft, gravid FHTs 130-140's, mod var, + accel, category 1 toco occ  23yo [id number] at 30+ S/p BMZ Has signed tubal papers Breech on last [place] BP controlled with Procardia 60mg  bid, labetalol 400mg  tid

## 2019-05-29 LAB — COMPREHENSIVE METABOLIC PANEL
ALT: 14 U/L (ref 0–44)
AST: 21 U/L (ref 15–41)
Albumin: 2.6 g/dL — ABNORMAL LOW (ref 3.5–5.0)
Alkaline Phosphatase: 83 U/L (ref 38–126)
Anion gap: 9 (ref 5–15)
BUN: 12 mg/dL (ref 6–20)
CO2: 23 mmol/L (ref 22–32)
Calcium: 8.8 mg/dL — ABNORMAL LOW (ref 8.9–10.3)
Chloride: 103 mmol/L (ref 98–111)
Creatinine, Ser: 0.73 mg/dL (ref 0.44–1.00)
GFR calc Af Amer: >60 mL/min (ref >60)
GFR calc non Af Amer: >60 mL/min (ref >60)
Glucose, Bld: 126 mg/dL — ABNORMAL HIGH (ref 70–99)
Potassium: 3 mmol/L — ABNORMAL LOW (ref 3.5–5.1)
Sodium: 135 mmol/L (ref 135–145)
Total Bilirubin: 0.3 mg/dL (ref 0.3–1.2)
Total Protein: 6.2 g/dL — ABNORMAL LOW (ref 6.5–8.1)

## 2019-05-29 LAB — CBC
HCT: 27.7 % — ABNORMAL LOW (ref 36.0–46.0)
Hemoglobin: 8.3 g/dL — ABNORMAL LOW (ref 12.0–15.0)
MCH: 21.8 pg — ABNORMAL LOW (ref 26.0–34.0)
MCHC: 30 g/dL (ref 30.0–36.0)
MCV: 72.9 fL — ABNORMAL LOW (ref 80.0–100.0)
Platelets: 264 10*3/uL (ref 150–400)
RBC: 3.8 MIL/uL — ABNORMAL LOW (ref 3.87–5.11)
RDW: 19.9 % — ABNORMAL HIGH (ref 11.5–15.5)
WBC: 7.9 10*3/uL (ref 4.0–10.5)
nRBC: 0.9 % — ABNORMAL HIGH (ref 0.0–0.2)

## 2019-05-29 LAB — LACTATE DEHYDROGENASE: LDH: 215 U/L — ABNORMAL HIGH (ref 98–192)

## 2019-05-29 LAB — URIC ACID: Uric Acid, Serum: 5.4 mg/dL (ref 2.5–7.1)

## 2019-05-29 MED ORDER — MAGNESIUM SULFATE BOLUS VIA INFUSION
4.0000 g | Freq: Once | INTRAVENOUS | Status: AC
  Administered 2019-05-29: 11:00:00 4 g via INTRAVENOUS
  Filled 2019-05-29: qty 1000

## 2019-05-29 MED ORDER — MAGNESIUM SULFATE 40 GM/1000ML IV SOLN
2.0000 g/h | INTRAVENOUS | Status: DC
  Administered 2019-05-30: 06:00:00 2 g/h via INTRAVENOUS
  Filled 2019-05-29: qty 1000

## 2019-05-29 MED ORDER — MAGNESIUM SULFATE 40 GM/1000ML IV SOLN
INTRAVENOUS | Status: AC
  Filled 2019-05-29: qty 1000

## 2019-05-29 MED ORDER — LACTATED RINGERS IV SOLN
INTRAVENOUS | Status: DC
  Administered 2019-05-29 – 2019-05-30 (×2): via INTRAVENOUS

## 2019-05-29 MED ORDER — SODIUM CHLORIDE 0.9 % IV SOLN
400.0000 mg | Freq: Once | INTRAVENOUS | Status: AC
  Administered 2019-05-29: 400 mg via INTRAVENOUS
  Filled 2019-05-29: qty 20

## 2019-05-29 NOTE — Progress Notes (Signed)
Patient ID: Eileen Miller, female   DOB: 28-Jun-1996, 23 y.o.   MRN: [id number] BP noted at 145 - 147/100-103  Continue on MgSO4  Serial BP checks q 4hrs  Labetalol given at [place] given at  [place]

## 2019-05-29 NOTE — Progress Notes (Signed)
Patient ID: Eileen Miller, female   DOB: 11/23/1995, 23 y.o.   MRN: [id number] Pt doing well with MgSO4 infusion BP improved at 134-143/95-99 Pt asymptomatic  ALT - 14, AST - 21, uric acid 5.4 7.9>8.3<264  LDH 215  A/P: Continue with current management plan

## 2019-05-29 NOTE — Progress Notes (Signed)
Patient ID: Eileen Miller, female   DOB: [birth year], 23 y.o.   MRN: [id number] Pt denies HA or blurry vision ; reports mild dizziness. Appreciating FMs  VS: 147/103 ( 147-151/102-104), 84 GEN - NAD, conversant EFM - 140, cat 1 TOCO - no contractions SVE - deferred  No labs this am yet:  05/28/19: Hg 8.1   A/P: This is a 23yo [id number] @ 30 1/7 by 16wk scan admitted with CHTN ( HD#6)  with Superimposed PreE. Hx Preterm delivery x2 via LTCSx2, both with PreE; Was breech on last [place], hypokalemia- stable, anemia Desires BTL  1) CHTN w/ SI PreE -On Labetalol 400mg  TID plus Procardia 60XL BID. Received a dose of labetalol and procardia two hours ago.  - Will start on MgSO4 for CP and seizure prophylaxis -If persistently severe range requiring multiple doses of emergency antihypertensives or new onset of PreE symptoms, will move forward with RTLCS+BTL. Pt agrees to this   -EFW by MFM: 10/17: breech presentation, R lateral plac, nl AFI, good growth 3#3, 55%, EDC 08/06/19, nl anat -repeat labs this am pending  2) Hypokalemia - 2.7 on admission -On PO supplementation at admission (20 BID, then 40 TID) -PO meds D/C'ed; K currently stable, will check on labs  3) Anemia: chronic, was on PO at home -7.8/25.5 on admission -S/p Venofer x1, 2nd dose this 10/19; CBC 10/22  8.1 -One more dose of venofer ordered for today; will need 2upRBC on call for section if needed  3) IUP @ 30 1/7 -S/p NICU consult, MFM scan -s/p BMTZ course 10/16-17 -GBS neg -NST TID

## 2019-05-30 LAB — COMPREHENSIVE METABOLIC PANEL
ALT: 15 U/L (ref 0–44)
AST: 20 U/L (ref 15–41)
Albumin: 2.6 g/dL — ABNORMAL LOW (ref 3.5–5.0)
Alkaline Phosphatase: 89 U/L (ref 38–126)
Anion gap: 10 (ref 5–15)
BUN: 10 mg/dL (ref 6–20)
CO2: 22 mmol/L (ref 22–32)
Calcium: 7.5 mg/dL — ABNORMAL LOW (ref 8.9–10.3)
Chloride: 103 mmol/L (ref 98–111)
Creatinine, Ser: 0.72 mg/dL (ref 0.44–1.00)
GFR calc Af Amer: >60 mL/min (ref >60)
GFR calc non Af Amer: >60 mL/min (ref >60)
Glucose, Bld: 86 mg/dL (ref 70–99)
Potassium: 3 mmol/L — ABNORMAL LOW (ref 3.5–5.1)
Sodium: 135 mmol/L (ref 135–145)
Total Bilirubin: 0.3 mg/dL (ref 0.3–1.2)
Total Protein: 6.3 g/dL — ABNORMAL LOW (ref 6.5–8.1)

## 2019-05-30 LAB — CBC
HCT: 28.3 % — ABNORMAL LOW (ref 36.0–46.0)
Hemoglobin: 8.6 g/dL — ABNORMAL LOW (ref 12.0–15.0)
MCH: 22.1 pg — ABNORMAL LOW (ref 26.0–34.0)
MCHC: 30.4 g/dL (ref 30.0–36.0)
MCV: 72.8 fL — ABNORMAL LOW (ref 80.0–100.0)
Platelets: 247 10*3/uL (ref 150–400)
RBC: 3.89 MIL/uL (ref 3.87–5.11)
RDW: 20.6 % — ABNORMAL HIGH (ref 11.5–15.5)
WBC: 7.2 10*3/uL (ref 4.0–10.5)
nRBC: 0.6 % — ABNORMAL HIGH (ref 0.0–0.2)

## 2019-05-30 LAB — MAGNESIUM: Magnesium: 6.3 mg/dL (ref 1.7–2.4)

## 2019-05-30 LAB — TYPE AND SCREEN
ABO/RH(D): O POS
Antibody Screen: NEGATIVE

## 2019-05-30 MED ORDER — MAGNESIUM SULFATE 40 GM/1000ML IV SOLN: 1.0000 g/h | INTRAVENOUS | Status: DC

## 2019-05-30 NOTE — Progress Notes (Signed)
Results for VALARIA, KOHUT (MRN [id number]) as of 05/30/2019 09:14  Ref. Range 05/30/2019 06:59  Magnesium Latest Ref Range: 1.7 - 2.4 mg/dL 6.3 (HH)   Dr. Lanette Ell Piedra aware of critical magnesium level called by the lab. Ordered to decrease magnesium from 2gm/hr to 1gm/hr. Will continue to monitor. Toya Smothers, RN

## 2019-05-30 NOTE — Progress Notes (Signed)
Dr. Kamera Dubas Piedra notified in regards to patient reporting Nausea, extreme fatigue, and right epigastric pain. Stat Magnesium level ordered. Toya Smothers, RN

## 2019-05-30 NOTE — Progress Notes (Signed)
Patient ID: Eileen Miller, female   DOB: 08-19-1995, 23 y.o.   MRN: [id number] Pt was somnolent from MgSO4 infusion this am. Mg level checked and was 6.3. Rate was then reduced from 2gm/hr to 1gm/hr and pt reported felt much better. Pt continues to deny HA or blurry vision. She is reporting +Fms, no contractions or leakage of fluid.    VS- 137-148/96-102, 86 GEN - NAD, conversant, ambulating to bathroom with no issues ABD - cw GA, nontender EXT - no homans, +1 DTRs, no clonus  EFM - 120s, moderate variability, prolonged deceleration into 90s x 2 minutes with spontaneous resolution - none further x 1-2 hrs after  TOCO - no contractions  SVE - deferred                        ALT- 15, AST - 20                            7.2>8.6<247  -[place] 10/17:breech presentation, R lateral plac, nl AFI, good growth                           3lbs 3oz, 55%, nl anat   A/P: This is a 23yo [id number] @ 30 2/7 by 16wk scan admitted with CHTN ( HD#7)  with Superimposed PreE per severe range BP Hx Preterm delivery x2 via LTCSx2, both with PreE; Was breech on last [place], hypokalemia- stable, anemia  1) CHTN w/ SI PreE -On Labetalol 400mg  TID plus Procardia 60XL BID. Next dose of labetalol due at 1400 today. -Completed MgSO4 for CP and seizure prophylaxis x 24 hrs today -If persistently severe range BP requiring multiple doses of emergency antihypertensives or new onset of PreE symptoms, will move forward with RTLCS+BTL. Pt agrees to this. Reviewed symptoms with pt and mother again this am   2) Hypokalemia - 2.7 on admission -On PO supplementation at admission (20 BID, then 40 TID) -PO meds D/C'ed; K currently stable, will check on labs  3) Anemia: chronic, was on PO at home -7.8/25.5 on admission -S/p Venofer x3, 3nd dose 10/23; Hg 8.6 today - will need 2upRBC on call for section  3) IUP @ 30 2/7 -S/p NICU consult, MFM scan -s/p BMTZ course 10/16-17;  -s/p MgSO4 10/24 -GBS neg -NST BID - Desires BTL-  consent signed

## 2019-05-30 NOTE — Progress Notes (Signed)
Dr. Yomira Flitton Piedra notified of patient having a prolong decel on EFM down into the 90's with return to baseline of 120. Pt remains asleep without complaint and with easy arousal . Provider states she will be at bedside shortly to review. Toya Smothers, RN

## 2019-05-31 ENCOUNTER — Encounter (HOSPITAL_COMMUNITY): Payer: Self-pay | Admitting: Anesthesiology

## 2019-05-31 ENCOUNTER — Inpatient Hospital Stay (HOSPITAL_COMMUNITY): Payer: Medicaid Other | Admitting: Anesthesiology

## 2019-05-31 ENCOUNTER — Encounter (HOSPITAL_COMMUNITY)
Admission: AD | Disposition: A | Discharge status: Home / Self Care | Payer: Self-pay | Source: Home / Self Care | Attending: Obstetrics and Gynecology

## 2019-05-31 DIAGNOSIS — Z98891 History of uterine scar from previous surgery: Secondary | ICD-10-CM

## 2019-05-31 DIAGNOSIS — Z9851 Tubal ligation status: Secondary | ICD-10-CM

## 2019-05-31 DIAGNOSIS — O1410 Severe pre-eclampsia, unspecified trimester: Secondary | ICD-10-CM

## 2019-05-31 HISTORY — DX: Tubal ligation status: Z98.51

## 2019-05-31 LAB — COMPREHENSIVE METABOLIC PANEL
ALT: 15 U/L (ref 0–44)
AST: 21 U/L (ref 15–41)
Albumin: 3 g/dL — ABNORMAL LOW (ref 3.5–5.0)
Alkaline Phosphatase: 91 U/L (ref 38–126)
Anion gap: 12 (ref 5–15)
BUN: 7 mg/dL (ref 6–20)
CO2: 21 mmol/L — ABNORMAL LOW (ref 22–32)
Calcium: 8.7 mg/dL — ABNORMAL LOW (ref 8.9–10.3)
Chloride: 105 mmol/L (ref 98–111)
Creatinine, Ser: 0.72 mg/dL (ref 0.44–1.00)
GFR calc Af Amer: >60 mL/min (ref >60)
GFR calc non Af Amer: >60 mL/min (ref >60)
Glucose, Bld: 113 mg/dL — ABNORMAL HIGH (ref 70–99)
Potassium: 3.4 mmol/L — ABNORMAL LOW (ref 3.5–5.1)
Sodium: 138 mmol/L (ref 135–145)
Total Bilirubin: 0.2 mg/dL — ABNORMAL LOW (ref 0.3–1.2)
Total Protein: 7.1 g/dL (ref 6.5–8.1)

## 2019-05-31 LAB — CBC
HCT: 32.1 % — ABNORMAL LOW (ref 36.0–46.0)
Hemoglobin: 9.7 g/dL — ABNORMAL LOW (ref 12.0–15.0)
MCH: 22.5 pg — ABNORMAL LOW (ref 26.0–34.0)
MCHC: 30.2 g/dL (ref 30.0–36.0)
MCV: 74.5 fL — ABNORMAL LOW (ref 80.0–100.0)
Platelets: 258 10*3/uL (ref 150–400)
RBC: 4.31 MIL/uL (ref 3.87–5.11)
RDW: 21.6 % — ABNORMAL HIGH (ref 11.5–15.5)
WBC: 7.3 10*3/uL (ref 4.0–10.5)
nRBC: 0.4 % — ABNORMAL HIGH (ref 0.0–0.2)

## 2019-05-31 LAB — URIC ACID: Uric Acid, Serum: 5.3 mg/dL (ref 2.5–7.1)

## 2019-05-31 LAB — LACTATE DEHYDROGENASE: LDH: 244 U/L — ABNORMAL HIGH (ref 98–192)

## 2019-05-31 SURGERY — Surgical Case
Anesthesia: Spinal | Site: Abdomen | Wound class: Clean Contaminated

## 2019-05-31 MED ORDER — TETANUS-DIPHTH-ACELL PERTUSSIS 5-2.5-18.5 LF-MCG/0.5 IM SUSP: 0.5000 mL | Freq: Once | INTRAMUSCULAR | Status: DC

## 2019-05-31 MED ORDER — DIPHENHYDRAMINE HCL 25 MG PO CAPS: 25.0000 mg | ORAL_CAPSULE | Freq: Four times a day (QID) | ORAL | Status: DC | PRN

## 2019-05-31 MED ORDER — ONDANSETRON HCL 4 MG/2ML IJ SOLN: 4.0000 mg | Freq: Three times a day (TID) | INTRAMUSCULAR | Status: DC | PRN

## 2019-05-31 MED ORDER — DIBUCAINE (PERIANAL) 1 % EX OINT: 1.0000 "application " | TOPICAL_OINTMENT | CUTANEOUS | Status: DC | PRN

## 2019-05-31 MED ORDER — LACTATED RINGERS IV SOLN
INTRAVENOUS | Status: DC | PRN
  Administered 2019-05-31: 14:00:00 via INTRAVENOUS

## 2019-05-31 MED ORDER — SIMETHICONE 80 MG PO CHEW
80.0000 mg | CHEWABLE_TABLET | Freq: Three times a day (TID) | ORAL | Status: DC
  Filled 2019-05-31: qty 1

## 2019-05-31 MED ORDER — DEXAMETHASONE SODIUM PHOSPHATE 10 MG/ML IJ SOLN
INTRAMUSCULAR | Status: DC | PRN
  Administered 2019-05-31: 10 mg via INTRAVENOUS

## 2019-05-31 MED ORDER — NALBUPHINE SYRINGE 5 MG/0.5 ML
5.0000 mg | INJECTION | INTRAMUSCULAR | Status: DC | PRN
  Filled 2019-05-31: qty 0.5

## 2019-05-31 MED ORDER — SODIUM CHLORIDE 0.9% FLUSH: 3.0000 mL | INTRAVENOUS | Status: DC | PRN

## 2019-05-31 MED ORDER — OXYTOCIN 40 UNITS IN NORMAL SALINE INFUSION - SIMPLE MED
INTRAVENOUS | Status: DC | PRN
  Administered 2019-05-31: 40 [IU] via INTRAVENOUS

## 2019-05-31 MED ORDER — SENNOSIDES-DOCUSATE SODIUM 8.6-50 MG PO TABS
2.0000 | ORAL_TABLET | ORAL | Status: DC
  Administered 2019-05-31 – 2019-06-02 (×3): 2 via ORAL
  Filled 2019-05-31 (×3): qty 2

## 2019-05-31 MED ORDER — WITCH HAZEL-GLYCERIN EX PADS: 1.0000 "application " | MEDICATED_PAD | CUTANEOUS | Status: DC | PRN

## 2019-05-31 MED ORDER — KETOROLAC TROMETHAMINE 30 MG/ML IJ SOLN: 30.0000 mg | Freq: Once | INTRAMUSCULAR | Status: DC

## 2019-05-31 MED ORDER — SIMETHICONE 80 MG PO CHEW
80.0000 mg | CHEWABLE_TABLET | ORAL | Status: DC
  Administered 2019-05-31: 80 mg via ORAL

## 2019-05-31 MED ORDER — DEXAMETHASONE SODIUM PHOSPHATE 10 MG/ML IJ SOLN
INTRAMUSCULAR | Status: AC
  Filled 2019-05-31: qty 1

## 2019-05-31 MED ORDER — LACTATED RINGERS IV SOLN
INTRAVENOUS | Status: AC
  Administered 2019-05-31 – 2019-06-01 (×4): via INTRAVENOUS
  Administered 2019-06-02: 50 mL/h via INTRAVENOUS

## 2019-05-31 MED ORDER — MENTHOL 3 MG MT LOZG: 1.0000 | LOZENGE | OROMUCOSAL | Status: DC | PRN

## 2019-05-31 MED ORDER — KETOROLAC TROMETHAMINE 30 MG/ML IJ SOLN: 30.0000 mg | Freq: Four times a day (QID) | INTRAMUSCULAR | Status: AC | PRN

## 2019-05-31 MED ORDER — LABETALOL HCL 5 MG/ML IV SOLN
20.0000 mg | INTRAVENOUS | Status: DC | PRN
  Administered 2019-05-31: 20 mg via INTRAVENOUS

## 2019-05-31 MED ORDER — PROMETHAZINE HCL 25 MG/ML IJ SOLN: 6.2500 mg | INTRAMUSCULAR | Status: DC | PRN

## 2019-05-31 MED ORDER — CEFAZOLIN SODIUM-DEXTROSE 2-4 GM/100ML-% IV SOLN
INTRAVENOUS | Status: AC
  Filled 2019-05-31: qty 100

## 2019-05-31 MED ORDER — NALBUPHINE SYRINGE 5 MG/0.5 ML
5.0000 mg | INJECTION | Freq: Once | INTRAMUSCULAR | Status: DC | PRN
  Filled 2019-05-31: qty 0.5

## 2019-05-31 MED ORDER — MORPHINE SULFATE (PF) 0.5 MG/ML IJ SOLN
INTRAMUSCULAR | Status: AC
  Filled 2019-05-31: qty 10

## 2019-05-31 MED ORDER — LABETALOL HCL 5 MG/ML IV SOLN
INTRAVENOUS | Status: AC
  Filled 2019-05-31: qty 4

## 2019-05-31 MED ORDER — HYDRALAZINE HCL 20 MG/ML IJ SOLN
10.0000 mg | Freq: Once | INTRAMUSCULAR | Status: AC
  Administered 2019-05-31: 10 mg via INTRAVENOUS

## 2019-05-31 MED ORDER — ZOLPIDEM TARTRATE 5 MG PO TABS: 5.0000 mg | ORAL_TABLET | Freq: Every evening | ORAL | Status: DC | PRN

## 2019-05-31 MED ORDER — SIMETHICONE 80 MG PO CHEW
80.0000 mg | CHEWABLE_TABLET | ORAL | Status: DC | PRN
  Administered 2019-06-02 (×2): 80 mg via ORAL
  Filled 2019-05-31 (×2): qty 1

## 2019-05-31 MED ORDER — GABAPENTIN 100 MG PO CAPS
100.0000 mg | ORAL_CAPSULE | Freq: Two times a day (BID) | ORAL | Status: DC
  Administered 2019-05-31 – 2019-06-03 (×6): 100 mg via ORAL
  Filled 2019-05-31 (×6): qty 1

## 2019-05-31 MED ORDER — ACETAMINOPHEN 500 MG PO TABS
1000.0000 mg | ORAL_TABLET | Freq: Four times a day (QID) | ORAL | Status: DC
  Administered 2019-05-31 – 2019-06-02 (×7): 1000 mg via ORAL
  Filled 2019-05-31 (×9): qty 2

## 2019-05-31 MED ORDER — NIFEDIPINE ER OSMOTIC RELEASE 30 MG PO TB24: 60.0000 mg | ORAL_TABLET | Freq: Every day | ORAL | Status: DC

## 2019-05-31 MED ORDER — FENTANYL CITRATE (PF) 100 MCG/2ML IJ SOLN
25.0000 ug | INTRAMUSCULAR | Status: DC | PRN
  Administered 2019-05-31: 16:00:00 50 ug via INTRAVENOUS

## 2019-05-31 MED ORDER — SCOPOLAMINE 1 MG/3DAYS TD PT72: 1.0000 | MEDICATED_PATCH | TRANSDERMAL | Status: DC

## 2019-05-31 MED ORDER — CHLOROPROCAINE HCL (PF) 3 % IJ SOLN
INTRAMUSCULAR | Status: AC
  Filled 2019-05-31: qty 20

## 2019-05-31 MED ORDER — STERILE WATER FOR IRRIGATION IR SOLN
Status: DC | PRN
  Administered 2019-05-31: 1000 mL

## 2019-05-31 MED ORDER — KETOROLAC TROMETHAMINE 30 MG/ML IJ SOLN
30.0000 mg | Freq: Once | INTRAMUSCULAR | Status: AC
  Administered 2019-05-31: 30 mg via INTRAVENOUS

## 2019-05-31 MED ORDER — PRENATAL MULTIVITAMIN CH
1.0000 | ORAL_TABLET | Freq: Every day | ORAL | Status: DC
  Administered 2019-06-01 – 2019-06-02 (×2): 1 via ORAL
  Filled 2019-05-31 (×2): qty 1

## 2019-05-31 MED ORDER — DIPHENHYDRAMINE HCL 50 MG/ML IJ SOLN: 12.5000 mg | INTRAMUSCULAR | Status: DC | PRN

## 2019-05-31 MED ORDER — HYDRALAZINE HCL 20 MG/ML IJ SOLN
INTRAMUSCULAR | Status: AC
  Filled 2019-05-31: qty 1

## 2019-05-31 MED ORDER — FENTANYL CITRATE (PF) 100 MCG/2ML IJ SOLN
INTRAMUSCULAR | Status: AC
  Filled 2019-05-31: qty 2

## 2019-05-31 MED ORDER — MORPHINE SULFATE (PF) 0.5 MG/ML IJ SOLN
INTRAMUSCULAR | Status: DC | PRN
  Administered 2019-05-31: .15 mg via INTRATHECAL
  Administered 2019-05-31: .5 mg via INTRAVENOUS
  Administered 2019-05-31: 1 mg via INTRAVENOUS
  Administered 2019-05-31: .35 mg via INTRAVENOUS

## 2019-05-31 MED ORDER — ONDANSETRON HCL 4 MG/2ML IJ SOLN
INTRAMUSCULAR | Status: DC | PRN
  Administered 2019-05-31: 4 mg via INTRAVENOUS

## 2019-05-31 MED ORDER — IBUPROFEN 800 MG PO TABS
800.0000 mg | ORAL_TABLET | Freq: Three times a day (TID) | ORAL | Status: DC
  Administered 2019-05-31 – 2019-06-03 (×7): 800 mg via ORAL
  Filled 2019-05-31 (×7): qty 1

## 2019-05-31 MED ORDER — ACETAMINOPHEN 160 MG/5ML PO SOLN: 1000.0000 mg | Freq: Once | ORAL | Status: DC

## 2019-05-31 MED ORDER — FERROUS SULFATE 325 (65 FE) MG PO TABS: 325.0000 mg | ORAL_TABLET | Freq: Two times a day (BID) | ORAL | Status: DC

## 2019-05-31 MED ORDER — NALOXONE HCL 0.4 MG/ML IJ SOLN: 0.4000 mg | INTRAMUSCULAR | Status: DC | PRN

## 2019-05-31 MED ORDER — DIPHENHYDRAMINE HCL 25 MG PO CAPS: 25.0000 mg | ORAL_CAPSULE | ORAL | Status: DC | PRN

## 2019-05-31 MED ORDER — LABETALOL HCL 5 MG/ML IV SOLN
40.0000 mg | INTRAVENOUS | Status: DC | PRN
  Administered 2019-05-31: 40 mg via INTRAVENOUS
  Filled 2019-05-31: qty 8

## 2019-05-31 MED ORDER — FENTANYL CITRATE (PF) 100 MCG/2ML IJ SOLN
INTRAMUSCULAR | Status: DC | PRN
  Administered 2019-05-31: 15 ug via INTRATHECAL
  Administered 2019-05-31: 85 ug via INTRAVENOUS

## 2019-05-31 MED ORDER — CEFAZOLIN SODIUM-DEXTROSE 2-4 GM/100ML-% IV SOLN
2.0000 g | Freq: Once | INTRAVENOUS | Status: AC
  Administered 2019-05-31: 2 g via INTRAVENOUS

## 2019-05-31 MED ORDER — ONDANSETRON HCL 4 MG/2ML IJ SOLN
INTRAMUSCULAR | Status: AC
  Filled 2019-05-31: qty 2

## 2019-05-31 MED ORDER — SOD CITRATE-CITRIC ACID 500-334 MG/5ML PO SOLN
ORAL | Status: AC
  Administered 2019-05-31: 14:00:00 30 mL
  Filled 2019-05-31: qty 30

## 2019-05-31 MED ORDER — SODIUM CHLORIDE 0.9 % IR SOLN
Status: DC | PRN
  Administered 2019-05-31: 1000 mL

## 2019-05-31 MED ORDER — LABETALOL HCL 5 MG/ML IV SOLN
80.0000 mg | INTRAVENOUS | Status: DC | PRN
  Administered 2019-05-31: 80 mg via INTRAVENOUS
  Filled 2019-05-31: qty 16

## 2019-05-31 MED ORDER — NALOXONE HCL 4 MG/10ML IJ SOLN
1.0000 ug/kg/h | INTRAVENOUS | Status: DC | PRN
  Filled 2019-05-31: qty 5

## 2019-05-31 MED ORDER — BUPIVACAINE IN DEXTROSE 0.75-8.25 % IT SOLN
INTRATHECAL | Status: DC | PRN
  Administered 2019-05-31: 1.8 mL via INTRATHECAL

## 2019-05-31 MED ORDER — HYDRALAZINE HCL 10 MG PO TABS: 10.0000 mg | ORAL_TABLET | Freq: Four times a day (QID) | ORAL | Status: DC | PRN

## 2019-05-31 MED ORDER — OXYTOCIN 40 UNITS IN NORMAL SALINE INFUSION - SIMPLE MED: 2.5000 [IU]/h | INTRAVENOUS | Status: AC

## 2019-05-31 MED ORDER — COCONUT OIL OIL
1.0000 "application " | TOPICAL_OIL | Status: DC | PRN
  Administered 2019-06-02: 1 via TOPICAL

## 2019-05-31 MED ORDER — SODIUM CHLORIDE 0.9 % IV SOLN
INTRAVENOUS | Status: DC | PRN
  Administered 2019-05-31: 15:00:00 via INTRAVENOUS

## 2019-05-31 MED ORDER — ACETAMINOPHEN 500 MG PO TABS: 1000.0000 mg | ORAL_TABLET | Freq: Once | ORAL | Status: DC

## 2019-05-31 MED ORDER — OXYCODONE HCL 5 MG PO TABS
5.0000 mg | ORAL_TABLET | ORAL | Status: DC | PRN
  Administered 2019-05-31: 10 mg via ORAL
  Administered 2019-06-02: 12:00:00 5 mg via ORAL
  Administered 2019-06-03: 10 mg via ORAL
  Filled 2019-05-31: qty 2
  Filled 2019-05-31: qty 1
  Filled 2019-05-31: qty 2

## 2019-05-31 MED ORDER — KETOROLAC TROMETHAMINE 30 MG/ML IJ SOLN
INTRAMUSCULAR | Status: AC
  Filled 2019-05-31: qty 1

## 2019-05-31 MED ORDER — ACETAMINOPHEN 500 MG PO TABS: 1000.0000 mg | ORAL_TABLET | Freq: Four times a day (QID) | ORAL | Status: DC

## 2019-05-31 SURGICAL SUPPLY — 34 items
BENZOIN TINCTURE PRP APPL 2/3 (GAUZE/BANDAGES/DRESSINGS) ×3 IMPLANT
CHLORAPREP W/TINT 26ML (MISCELLANEOUS) ×3 IMPLANT
CLAMP CORD UMBIL (MISCELLANEOUS) ×3 IMPLANT
CLOSURE WOUND 1/2 X4 (GAUZE/BANDAGES/DRESSINGS) ×1
CLOTH BEACON ORANGE TIMEOUT ST (SAFETY) ×3 IMPLANT
DRSG OPSITE POSTOP 4X10 (GAUZE/BANDAGES/DRESSINGS) ×3 IMPLANT
ELECT REM PT RETURN 9FT ADLT (ELECTROSURGICAL) ×3
ELECTRODE REM PT RTRN 9FT ADLT (ELECTROSURGICAL) ×1 IMPLANT
GLOVE BIOGEL PI IND STRL 7.0 (GLOVE) ×1 IMPLANT
GLOVE BIOGEL PI INDICATOR 7.0 (GLOVE) ×2
GLOVE ORTHO TXT STRL SZ7.5 (GLOVE) ×3 IMPLANT
GOWN STRL REUS W/TWL LRG LVL3 (GOWN DISPOSABLE) ×6 IMPLANT
KIT ABG SYR 3ML LUER SLIP (SYRINGE) ×3 IMPLANT
NEEDLE HYPO 25X5/8 SAFETYGLIDE (NEEDLE) ×3 IMPLANT
NS IRRIG 1000ML POUR BTL (IV SOLUTION) ×3 IMPLANT
PACK C SECTION WH (CUSTOM PROCEDURE TRAY) ×3 IMPLANT
PAD OB MATERNITY 4.3X12.25 (PERSONAL CARE ITEMS) ×3 IMPLANT
PENCIL SMOKE EVAC W/HOLSTER (ELECTROSURGICAL) ×3 IMPLANT
RETAINER VISCERAL (MISCELLANEOUS) ×3 IMPLANT
RTRCTR C-SECT PINK 25CM LRG (MISCELLANEOUS) ×3 IMPLANT
STRIP CLOSURE SKIN 1/2X4 (GAUZE/BANDAGES/DRESSINGS) ×2 IMPLANT
SUT CHROMIC 0 CT 1 (SUTURE) ×6 IMPLANT
SUT CHROMIC 1 CTX 36 (SUTURE) ×6 IMPLANT
SUT PLAIN 0 NONE (SUTURE) IMPLANT
SUT PLAIN 2 0 XLH (SUTURE) IMPLANT
SUT VIC AB 0 CT1 27 (SUTURE) ×4
SUT VIC AB 0 CT1 27XBRD ANBCTR (SUTURE) ×2 IMPLANT
SUT VIC AB 2-0 CT1 (SUTURE) ×3 IMPLANT
SUT VIC AB 2-0 CT1 27 (SUTURE) ×2
SUT VIC AB 2-0 CT1 TAPERPNT 27 (SUTURE) ×1 IMPLANT
SUT VIC AB 4-0 KS 27 (SUTURE) IMPLANT
TOWEL OR 17X24 6PK STRL BLUE (TOWEL DISPOSABLE) ×3 IMPLANT
TRAY FOLEY W/BAG SLVR 14FR LF (SET/KITS/TRAYS/PACK) ×3 IMPLANT
WATER STERILE IRR 1000ML POUR (IV SOLUTION) ×3 IMPLANT

## 2019-05-31 NOTE — Progress Notes (Signed)
   05/31/19 1341  Vital Signs  BP (!) 168/110  Dr. Terri Piedra notified of above BP after Labetalol 80mg  IV.  Order received to prepare for OR for c-section.

## 2019-05-31 NOTE — Anesthesia Postprocedure Evaluation (Signed)
Anesthesia Post Note  Patient: Angala Hilgers  Procedure(s) Performed: REPEAT CESAREAN SECTION WITH BILATERAL TUBAL LIGATION (N/A Abdomen)     Patient location during evaluation: PACU Anesthesia Type: Spinal Level of consciousness: awake and alert and oriented Pain management: pain level controlled Vital Signs Assessment: post-procedure vital signs reviewed and stable Respiratory status: spontaneous breathing, nonlabored ventilation and respiratory function stable Cardiovascular status: blood pressure returned to baseline Postop Assessment: no apparent nausea or vomiting, spinal receding, no headache and no backache Anesthetic complications: no    Last Vitals:  Vitals:   05/31/19 1716 05/31/19 1730  BP: (!) 155/113   Pulse: 70   Resp: 16   Temp: 36.7 C   SpO2: 100% 100%    Last Pain:  Vitals:   05/31/19 1716  TempSrc: Oral  PainSc:    Pain Goal: Patients Stated Pain Goal: 3 (05/30/19 0730)                 Brennan Bailey

## 2019-05-31 NOTE — Progress Notes (Signed)
Patient ID: Eileen Miller, female   DOB: Mar 28, 1996, 23 y.o.   MRN: [id number] Pt doing well. Chatting on phone. No complaints - no HA VSS - 146/96 ( 146/150/96-100), 80 GEN - NAD EFM - 130s, cat 1 TOCO - no contractions SVE - deferred  7.3>9.7<258 Pending cmp, uric acid and LDH  -[place] 10/17:breech presentation, R lateral plac, nl AFI, good growth, 3lbs 3oz, 55%, nl anat   A/P: This is a 23yo [id number] @ 30 3/7 by 16wk scan admitted with CHTN ( HD#8)  with Superimposed PreE per severe range BP Hx Preterm delivery x2 via LTCSx2, both with PreE; Was breech on last [place], hypokalemia- stable, anemia  1) CHTN w/ SI PreE -Controlled on Labetalol 400mg  TID plus Procardia 60XL BID. Next dose of labetalol due at 1400 today. -Completed MgSO4 for CP and seizure prophylaxis x 24 hrs on 11/24 -If persistently severe range BP requiring multiple doses of emergency antihypertensives or new onset of PreE symptoms, will move forward with RTLCS ( due to prior c/s x 2 and breech) and +BTL. Pt agrees to this.   2) Hypokalemia - 2.7 on admission -On PO supplementation at admission (20 BID, then 40 TID) -PO meds D/C'ed; monitor prn  3) Anemia: chronic, was on PO at home -7.8/25.5 on admission -S/p Venofer x3, 3nd dose 10/23; Hg 8.6 today - will need 2upRBC on call for section  3) IUP @ 30 3/7 -S/p NICU consult, MFM scan -s/p BMTZ course 10/16-17;  -s/p MgSO4 10/24 -GBS neg -NST BID - Desires BTL- consent signed

## 2019-05-31 NOTE — Op Note (Signed)
Operative Note    Preoperative Diagnosis: IUP at 30 3/7wks 2. Chronic Hypertension with superimposed preE - acute exaceration 3. Breech 4. Prior cesarean section ( preterm) x 2           5. Anemia   Postoperative Diagnosis: Same   Procedure: Repeat cesarean section with double layered closure and bilateral salpingectomy    Surgeon: Britt Bottom DO   Anesthesia: Spinal  Fluids: LR EBL: UOP:   Findings: Viable female infant delivered vertex, grossly normal uterus ( small subserosal fibroids), tubes and ovaries, moderate scarring, Apgars pending; vigorous cry and great tone on delivery   Specimen: Placenta to pathology Cord gases - pending   Procedure Note Pt with acute exacerbation of moderately controlled chronic hypertension with superimposed preecclampsia unresponsive to oral and IV antihypertensive treatment. Given medical history and status, decision made to proceed urgently to delivery  Patient was taken to the operating room where spinal anesthesia was administered. She was prepped and draped in the normal sterile fashion in the dorsal supine position with a leftward tilt. An appropriate time out was performed. An allis clamp test was performed and anesthesia was found to be adequate.  A Pfannenstiel skin incision was then made through the previous incision with the scalpel and carried through to the underlying layer of fascia by sharp dissection. The fascia was nicked in the midline and the incision was extended laterally with Mayo scissors. The superior aspect of the incision was grasped Coker clamps and dissected off the underlying rectus muscles. In a similar fashion the inferior aspect was dissected off the rectus muscles. Moderate scaring was noted and reduced sharply. Rectus muscles were separated in the midline and the peritoneal cavity entered bluntly. The peritoneal incision was then extended both superiorly and inferiorly with careful attention to avoid  both bowel and bladder. The Alexis self-retaining wound retractor was then placed within the incision and the lower uterine segment exposed.  The lower uterine segment was then incised in a transverse fashion and the cavity itself entered bluntly. Clear amniotic fluid was noted. The infant's head was then located and lifted and delivered from the incision without difficulty. The remainder of the infant delivered and the nose and mouth bulb suctioned . Cord was clamped and cut as well. The infant was handed off to the waiting NICU team. Cord blood and gases were obtained and the placenta was then spontaneously expressed from the uterus and the uterus cleared of all clots and debris with moist lap sponge. The uterine incision was then repaired in a double layer with 0 chromic suture; the first layer was repaired in a running locked fashion.  The left and then right tubes were then grasped with babcocks, clamped down to their fimbriated ends. They were suture ligated, cut and tied using 0 vicryl suture x 2 on each side. Excellent hemostasis was appreciated. The  ovaries were inspected and the gutters cleared of all clots and debris. The uterine incision was inspected and found to be hemostatic. All instruments and sponges as well as the Alexis retractor were then removed from the abdomen. The rectus muscles and peritoneum were then reapproximated with 2-0 Vicryl. The fascia was then closed with 0 Vicryl in a running fashion. The skin was closed with a subcuticular stitch of 4-0 Vicryl on a Keith needle and then reinforced with benzoin and Steri-Strips. At the conclusion of the procedure all instruments and sponge counts were correct. Patient was taken to the recovery room in  good condition with her baby accompanying her skin to skin.

## 2019-05-31 NOTE — Anesthesia Preprocedure Evaluation (Addendum)
Anesthesia Evaluation  Patient identified by MRN, date of birth, ID band Patient awake    Reviewed: Allergy & Precautions, NPO status   History of Anesthesia Complications Negative for: history of anesthetic complications  Airway Mallampati: II  TM Distance: >3 FB Neck ROM: Full    Dental no notable dental hx.    Pulmonary neg pulmonary ROS,    Pulmonary exam normal        Cardiovascular hypertension, Normal cardiovascular exam     Neuro/Psych negative neurological ROS  negative psych ROS   GI/Hepatic negative GI ROS, Neg liver ROS,   Endo/Other  negative endocrine ROS  Renal/GU negative Renal ROS  negative genitourinary   Musculoskeletal negative musculoskeletal ROS (+)   Abdominal   Peds  Hematology  (+) anemia , Hgb 9.7   Anesthesia Other Findings Day of surgery medications reviewed with patient.  Reproductive/Obstetrics (+) Pregnancy Chronic HTN with superimposed severe preE, on nifedipine and labetalol, [redacted]w[redacted]d EGA                            Anesthesia Physical Anesthesia Plan  ASA: III and emergent  Anesthesia Plan: Spinal   Post-op Pain Management:    Induction:   PONV Risk Score and Plan: 4 or greater and Treatment may vary due to age or medical condition, Ondansetron and Dexamethasone  Airway Management Planned: Natural Airway  Additional Equipment:   Intra-op Plan:   Post-operative Plan:   Informed Consent: I have reviewed the patients History and Physical, chart, labs and discussed the procedure including the risks, benefits and alternatives for the proposed anesthesia with the patient or authorized representative who has indicated his/her understanding and acceptance.     Dental advisory given  Plan Discussed with: CRNA  Anesthesia Plan Comments: (Ate full breakfast at 1000 today. Per Dr. Terri Piedra, urgent C/S and will proceed despite not being NPO. Daiva Huge, MD)        Anesthesia Quick Evaluation

## 2019-05-31 NOTE — Plan of Care (Signed)
NICU charge nurse Nira Conn notified of pt's c/s

## 2019-05-31 NOTE — Progress Notes (Signed)
   05/31/19 1226  Vital Signs  BP (!) 171/117  Dr. Terri Piedra notified of BP 157/111 and recheck of 150/110.  Above recheck also reported.  Orders received to restart Labetalol protocol, make pt. NPO.

## 2019-05-31 NOTE — Anesthesia Procedure Notes (Signed)
Spinal  Patient location during procedure: OR Start time: 05/31/2019 2:33 PM End time: 05/31/2019 2:37 PM Staffing Anesthesiologist: Brennan Bailey, MD Performed: anesthesiologist  Preanesthetic Checklist Completed: patient identified, pre-op evaluation, timeout performed, IV checked, risks and benefits discussed and monitors and equipment checked Spinal Block Patient position: sitting Prep: site prepped and draped and DuraPrep Patient monitoring: heart rate, continuous pulse ox and blood pressure Approach: midline Location: L4-5 Injection technique: single-shot Needle Needle type: Pencan  Needle gauge: 24 G Needle length: 10 cm Assessment Sensory level: T4 Additional Notes Risks, benefits, and alternative discussed. Patient gave consent to procedure. Prepped and draped in sitting position. 2 attempts required. Unable to access interspinous space at L3-4. Clear CSF obtained after one needle pass at L4-5. Positive terminal aspiration. No pain or paraesthesias with injection. Patient tolerated procedure well. Vital signs stable. Tawny Asal, MD

## 2019-05-31 NOTE — Transfer of Care (Signed)
Immediate Anesthesia Transfer of Care Note  Patient: Eileen Miller  Procedure(s) Performed: REPEAT CESAREAN SECTION WITH BILATERAL TUBAL LIGATION (N/A Abdomen)  Patient Location: PACU  Anesthesia Type:Spinal  Level of Consciousness: awake, alert  and oriented  Airway & Oxygen Therapy: Patient Spontanous Breathing  Post-op Assessment: Report given to RN and Post -op Vital signs reviewed and stable  Post vital signs: Reviewed and stable  Last Vitals:  Vitals Value Taken Time  BP 154/116 05/31/19 1558  Temp    Pulse 64 05/31/19 1558  Resp 15 05/31/19 1558  SpO2 100 % 05/31/19 1558  Vitals shown include unvalidated device data.  Last Pain:  Vitals:   05/31/19 1140  TempSrc: Oral  PainSc:       Patients Stated Pain Goal: 3 (11/65/79 0383)  Complications: No apparent anesthesia complications

## 2019-05-31 NOTE — Progress Notes (Signed)
Patient ID: Eileen Miller, female   DOB: 1996/04/15, 23 y.o.   MRN: [id number] Late entry Notified of elevated BP of 151-171/110-117, HR 74 Ordered IV labetalol protocol to be administered  Pt received 3 doses ( 20,40,80) with BP still at 168/110   Pt last ate an omelet and yogurt at 10am but we will need to proceed urgently at this time for repeat cesarean section and BTL   I notified anesthesia and OR staff

## 2019-06-01 LAB — COMPREHENSIVE METABOLIC PANEL
ALT: 15 U/L (ref 0–44)
AST: 21 U/L (ref 15–41)
Albumin: 2.7 g/dL — ABNORMAL LOW (ref 3.5–5.0)
Alkaline Phosphatase: 71 U/L (ref 38–126)
Anion gap: 10 (ref 5–15)
BUN: 6 mg/dL (ref 6–20)
CO2: 24 mmol/L (ref 22–32)
Calcium: 8.4 mg/dL — ABNORMAL LOW (ref 8.9–10.3)
Chloride: 103 mmol/L (ref 98–111)
Creatinine, Ser: 0.71 mg/dL (ref 0.44–1.00)
GFR calc Af Amer: >60 mL/min (ref >60)
GFR calc non Af Amer: >60 mL/min (ref >60)
Glucose, Bld: 78 mg/dL (ref 70–99)
Potassium: 3.2 mmol/L — ABNORMAL LOW (ref 3.5–5.1)
Sodium: 137 mmol/L (ref 135–145)
Total Bilirubin: 0.3 mg/dL (ref 0.3–1.2)
Total Protein: 6.2 g/dL — ABNORMAL LOW (ref 6.5–8.1)

## 2019-06-01 LAB — CBC
HCT: 23.5 % — ABNORMAL LOW (ref 36.0–46.0)
Hemoglobin: 7.1 g/dL — ABNORMAL LOW (ref 12.0–15.0)
MCH: 22.6 pg — ABNORMAL LOW (ref 26.0–34.0)
MCHC: 30.2 g/dL (ref 30.0–36.0)
MCV: 74.8 fL — ABNORMAL LOW (ref 80.0–100.0)
Platelets: 214 10*3/uL (ref 150–400)
RBC: 3.14 MIL/uL — ABNORMAL LOW (ref 3.87–5.11)
RDW: 21.4 % — ABNORMAL HIGH (ref 11.5–15.5)
WBC: 11.2 10*3/uL — ABNORMAL HIGH (ref 4.0–10.5)
nRBC: 0.4 % — ABNORMAL HIGH (ref 0.0–0.2)

## 2019-06-01 LAB — CBC WITH DIFFERENTIAL/PLATELET
Abs Immature Granulocytes: 0 10*3/uL (ref 0.00–0.07)
Basophils Absolute: 0 10*3/uL (ref 0.0–0.1)
Basophils Relative: 0 %
Eosinophils Absolute: 0 10*3/uL (ref 0.0–0.5)
Eosinophils Relative: 0 %
HCT: 25.9 % — ABNORMAL LOW (ref 36.0–46.0)
Hemoglobin: 7.8 g/dL — ABNORMAL LOW (ref 12.0–15.0)
Lymphocytes Relative: 26 %
Lymphs Abs: 3 10*3/uL (ref 0.7–4.0)
MCH: 22.4 pg — ABNORMAL LOW (ref 26.0–34.0)
MCHC: 30.1 g/dL (ref 30.0–36.0)
MCV: 74.4 fL — ABNORMAL LOW (ref 80.0–100.0)
Monocytes Absolute: 0.4 10*3/uL (ref 0.1–1.0)
Monocytes Relative: 3 %
Neutro Abs: 8.3 10*3/uL — ABNORMAL HIGH (ref 1.7–7.7)
Neutrophils Relative %: 71 %
Platelets: 261 10*3/uL (ref 150–400)
RBC: 3.48 MIL/uL — ABNORMAL LOW (ref 3.87–5.11)
RDW: 22.2 % — ABNORMAL HIGH (ref 11.5–15.5)
WBC: 11.7 10*3/uL — ABNORMAL HIGH (ref 4.0–10.5)
nRBC: 0 /100 WBC
nRBC: 0.3 % — ABNORMAL HIGH (ref 0.0–0.2)

## 2019-06-01 MED ORDER — LABETALOL HCL 5 MG/ML IV SOLN
INTRAVENOUS | Status: AC
  Administered 2019-06-01: 08:00:00 20 mg
  Filled 2019-06-01: qty 4

## 2019-06-01 MED ORDER — HYDRALAZINE HCL 20 MG/ML IJ SOLN: 10.0000 mg | INTRAMUSCULAR | Status: DC | PRN

## 2019-06-01 MED ORDER — LABETALOL HCL 5 MG/ML IV SOLN: 40.0000 mg | INTRAVENOUS | Status: DC | PRN

## 2019-06-01 MED ORDER — FERROUS SULFATE 325 (65 FE) MG PO TABS
325.0000 mg | ORAL_TABLET | Freq: Every day | ORAL | Status: DC
  Administered 2019-06-01 – 2019-06-02 (×2): 325 mg via ORAL
  Filled 2019-06-01: qty 1

## 2019-06-01 MED ORDER — LABETALOL HCL 5 MG/ML IV SOLN: 80.0000 mg | INTRAVENOUS | Status: DC | PRN

## 2019-06-01 MED ORDER — LABETALOL HCL 5 MG/ML IV SOLN
40.0000 mg | INTRAVENOUS | Status: DC | PRN
  Administered 2019-06-01: 09:00:00 40 mg via INTRAVENOUS
  Filled 2019-06-01: qty 8

## 2019-06-01 MED ORDER — LABETALOL HCL 5 MG/ML IV SOLN: 20.0000 mg | INTRAVENOUS | Status: DC | PRN

## 2019-06-01 MED ORDER — MAGNESIUM SULFATE 40 GM/1000ML IV SOLN
INTRAVENOUS | Status: AC
  Filled 2019-06-01: qty 1000

## 2019-06-01 MED ORDER — LABETALOL HCL 200 MG PO TABS
400.0000 mg | ORAL_TABLET | Freq: Two times a day (BID) | ORAL | Status: DC
  Administered 2019-06-01 – 2019-06-03 (×5): 400 mg via ORAL
  Filled 2019-06-01 (×5): qty 2

## 2019-06-01 MED ORDER — NIFEDIPINE ER OSMOTIC RELEASE 30 MG PO TB24
60.0000 mg | ORAL_TABLET | Freq: Two times a day (BID) | ORAL | Status: DC
  Administered 2019-06-01 – 2019-06-03 (×5): 60 mg via ORAL
  Filled 2019-06-01 (×5): qty 2

## 2019-06-01 MED ORDER — MAGNESIUM SULFATE 40 GM/1000ML IV SOLN
2.0000 g/h | INTRAVENOUS | Status: AC
  Administered 2019-06-01 – 2019-06-02 (×2): 2 g/h via INTRAVENOUS
  Filled 2019-06-01: qty 1000

## 2019-06-01 MED ORDER — MAGNESIUM SULFATE BOLUS VIA INFUSION
6.0000 g | Freq: Once | INTRAVENOUS | Status: AC
  Administered 2019-06-01: 6 g via INTRAVENOUS
  Filled 2019-06-01: qty 1000

## 2019-06-01 NOTE — Progress Notes (Signed)
CSW completed chart review and received medical update from bedside nurse.  MOB is currently on MAG. CSW plans to meet with MOB on tomorrow (10/27) after MAG is discontinued.   Laurey Arrow, MSW, LCSW Clinical Social Work [phone]

## 2019-06-01 NOTE — Lactation Note (Signed)
This note was copied from a baby's chart. Lactation Consultation Note  Patient Name: Eileen Miller [id number] Date: 06/01/2019 Reason for consult: Initial assessment;NICU baby;Preterm <34wks;Infant < 6lbs;Other (Comment)(mom with CHTN, PIH and on MagSo4) Baby is 25 hours old  As [place] arrived mom resting in bed and mentioned she has pumped x 1 since the DEBP was set up.  Moms lunch arrived and she needed to go to the bathroom so LC encouraged mom to eat her hot lunch and LC would come back.   LC reviewed the NICU Lactation booklet with mom , storage of breast milk for NICU and LC resources after D/C with phone numbers.   Maternal Data Has patient been taught Hand Expression?: Yes Does the patient have breastfeeding experience prior to this delivery?: Yes  Feeding    LATCH Score                   Interventions Interventions: Breast feeding basics reviewed  Lactation Tools Discussed/Used Tools: Pump Breast pump type: Double-Electric Breast Pump Pump Review: Milk Storage   Consult Status Consult Status: Follow-up Date: 06/01/19 Follow-up type: In-patient    [place] 06/01/2019, 2:08 PM

## 2019-06-01 NOTE — Lactation Note (Addendum)
This note was copied from a baby's chart. Lactation Consultation Note  Patient Name: Eileen Miller [id number] Date: 06/01/2019 Reason for consult: Follow-up assessment;Preterm <34wks;Infant < 6lbs;NICU baby  Randel Books is 36 hours old  2nd [place] visit for this mom in her room to review the DEBP and hand expressing . Mom has only pumped x 1 in 24 hours and this will be her 2nd time.  1st worked with mom to hand express , and 1-2 drops.  Mom able to return demo.  LC reviewed the DEBP and checked the #24 F and it was good fit, storage and cleaning.  LC reviewed breast feeding pumping goals of 8-10 times in 24 hours pumping both breast for 15 -20 mins starting with the initiation phase and then when she is getting 20 ml 3 x's ask the RN or LC to explain the maintenance phase.  Mom receptive to teaching and mentioned she really ones this to work this time.  Mom only attempted with her 1st 2 .  Per mom active with [place] . [place] sent a DEBP referral to the [place]  For a DEBP on dad of D/C.  LC provdied the NICU [facility] booklet with explanation of contents and the [facility] pamphlet with phone numbers.     Maternal Data Has patient been taught Hand Expression?: Yes Does the patient have breastfeeding experience prior to this delivery?: Yes  Feeding    LATCH Score                   Interventions Interventions: Breast feeding basics reviewed  Lactation Tools Discussed/Used Tools: Pump Breast pump type: Double-Electric Breast Pump WIC Program: Yes(LC faxed a DEBP referral to [place]) Pump Review: Milk Storage;Setup, frequency, and cleaning Initiated by:: MAI - reviewed   Consult Status Consult Status: Follow-up Date: 06/01/19 Follow-up type: In-patient    [place] 06/01/2019, 4:19 PM

## 2019-06-01 NOTE — Progress Notes (Signed)
Subjective: Postpartum Day #1: Cesarean Delivery Patient reports incisional pain, tolerating PO and no problems voiding.    Objective: Vital signs in last 24 hours: Temp:  [98 F (36.7 C)-98.6 F (37 C)] 98.6 F (37 C) (10/26 0753) Pulse Rate:  [61-86] 69 (10/26 0753) Resp:  [10-21] 16 (10/26 0753) BP: (122-177)/(87-120) 166/118 (10/26 0753) SpO2:  [97 %-100 %] 99 % (10/26 0810)  Physical Exam:  General: alert Lochia: appropriate Uterine Fundus: firm Incision: pressure dressing C/D/I   Recent Labs    05/31/19 1036 06/01/19 0515  HGB 9.7* 7.1*  HCT 32.1* 23.5*    Assessment/Plan: Status post Cesarean section. Postoperative course complicated by preeclampsia. BP elevated to 166/118 currently.  Will give IV labetalol, adjusted PO meds to Procardia XL 60 mg bid and labetalol 400 mg po bid, and will start magnesium for seizure prophylaxis.  Has chronic iron deficiency anemia with some acute blood loss anemia from c-section, stable for now, will monitor.   Will recheck labs later today.  Eileen Miller 06/01/2019, 8:21 AM

## 2019-06-01 NOTE — Lactation Note (Signed)
This note was copied from a baby's chart. Lactation Consultation Note Attempted to see mom 2 separate times. Mom was sleeping both times.  Patient Name: Eileen Miller [id number] Date: 06/01/2019     Maternal Data    Feeding    LATCH Score                   Interventions    Lactation Tools Discussed/Used     Consult Status      Eugenia Eldredge G 06/01/2019, 6:00 AM

## 2019-06-02 LAB — SURGICAL PATHOLOGY

## 2019-06-02 MED ORDER — ACETAMINOPHEN 500 MG PO TABS
1000.0000 mg | ORAL_TABLET | Freq: Three times a day (TID) | ORAL | Status: DC
  Administered 2019-06-02 (×2): 1000 mg via ORAL
  Filled 2019-06-02 (×4): qty 2

## 2019-06-02 MED ORDER — FERROUS SULFATE 325 (65 FE) MG PO TABS
325.0000 mg | ORAL_TABLET | Freq: Two times a day (BID) | ORAL | Status: DC
  Administered 2019-06-02 – 2019-06-03 (×2): 325 mg via ORAL
  Filled 2019-06-02 (×3): qty 1

## 2019-06-02 NOTE — Plan of Care (Signed)
  Problem: Education: Goal: Knowledge of condition will improve Outcome: Completed/Met   Problem: Activity: Goal: Will verbalize the importance of balancing activity with adequate rest periods Outcome: Completed/Met Goal: Ability to tolerate increased activity will improve Outcome: Completed/Met   Problem: Coping: Goal: Ability to identify and utilize available resources and services will improve Outcome: Completed/Met   Problem: Life Cycle: Goal: Chance of risk for complications during the postpartum period will decrease Outcome: Completed/Met   Problem: Role Relationship: Goal: Ability to demonstrate positive interaction with newborn will improve Outcome: Completed/Met

## 2019-06-02 NOTE — Lactation Note (Signed)
This note was copied from a baby's chart. Lactation Consultation Note  Patient Name: Eileen Miller [id number] Date: 06/02/2019   Per RN, this is Mom's 3rd baby in the NICU & Mom knows how to pump, etc. RN doesn't feel like Mom needs/wants a lactation visit today.  RN has told Mom to reach out if she has any questions.   Matthias Hughs [facility] 06/02/2019, 2:07 PM

## 2019-06-02 NOTE — Clinical Social Work Maternal (Signed)
CLINICAL SOCIAL WORK MATERNAL/CHILD NOTE  Patient Details  Name: Eileen Miller MRN: [id number] Date of Birth: [birth year]  Date:  06/02/2019  Clinical Social Worker Initiating Note:  Laurey Arrow Date/Time: Initiated:  06/02/19/1000     Child's Name:  Eileen Miller.   Biological Parents:  Mother, Father   Need for Interpreter:  None   Reason for Referral:  Parental Support of Premature Babies < 32 weeks/or Critically Ill babies   Address:  [address] [place] [address]    Phone number:  [phone] (home)     Additional phone number: FOB's number is [phone]  Household Members/Support Persons (HM/SP):   Household Member/Support Person 1, Household Member/Support Person 2(Per MOB, MOB resides with MOB's grandmother.)   HM/SP Name Relationship DOB or Age  HM/SP -14 Desmond Lope son 04/01/2014  HM/SP -2 Johnn Hai daughter 10/20/2017  HM/SP -3        HM/SP -4        HM/SP -5        HM/SP -6        HM/SP -7        HM/SP -8          Natural Supports (not living in the home):  Parent, Spouse/significant other, Immediate Family, Extended Family   Professional Supports: None   Employment: Full-time   Type of Work: Therapist, art with [employer]   Education:  [place] arranged:    Museum/gallery curator Resources:  [employer]   Other Resources:  [employer], Physicist, medical    Cultural/Religious Considerations Which May Impact Care:  Per [employer] Face Sheet MOB is [employer].   Strengths:  Ability to meet basic needs , Home prepared for child , Pediatrician chosen   Psychotropic Medications:         Pediatrician:    [facility] area  Pediatrician List:   [place]      Pediatrician Fax Number:    Risk Factors/Current Problems:      Cognitive State:  Able to Concentrate , Alert , Insightful , Linear Thinking     Mood/Affect:  Comfortable , Interested , Calm , Happy , Relaxed    CSW Assessment: CSW met with MOB in room 103 to complete an assessment for NICU admission.  When CSW arrived MOB was resting in bed.  CSW explained CSW's role and invited MOB to ask questions. MOB was polite, easy to engage, and was receptive to meeting with CSW.   CSW inquired about MOB's thoughts and feelings regarding infant's NICU admission and MOB reported feeling "Ok."  MOB reported "I'm not new to having my babies earlier.  All my babies have been preterm and my daughter stayed in the NICU for about a month."  MOB shared that she does not feel stressed or overwhelmed about infant's NICU admission.    CSW provided education regarding the baby blues period vs. perinatal mood disorders, discussed treatment and gave resources for mental health follow up if concerns arise.  CSW recommends self-evaluation during the postpartum time period using the New Mom Checklist from Postpartum Progress and encouraged MOB to contact a medical professional if symptoms are noted at any time. MOB acknowledged having PMAD symptoms for about 6 months after MOB's second child and reported not seeking help.  CSW encouraged MOB to seek help if symptoms present and  reviewed with MOB what signs and symptoms to look for. CSW also processed with MOB the barriers to MOB not seeking help and MOB reported, "I honestly didn't know what was going on with me.  It wasn't until afterwards that I realize it was postpartum depression/anxiety."  MOB assured CSW that MOB will seek help if help is warranted. MOB also reported having a good support team that consists of MOB's and FOB's immediate family members.   MOB denied all psychosocial stressors and reported having all essential items to care for infant.   MOB also reported having a good understanding of infant's health and shared feeling comfortable asking questions if needed to medical team.  CSW provided review  of Sudden Infant Death Syndrome (SIDS) precautions.    CSW will continue to provide resource and supports to family while infant remains in NICU.   CSW Plan/Description:  Psychosocial Support and Ongoing Assessment of Needs, Sudden Infant Death Syndrome (SIDS) Education, Perinatal Mood and Anxiety Disorder (PMADs) Education, Other Patient/Family Education, Other Information/Referral to [employer], MSW, [employer] Clinical Social Work [phone]  Dimple Nanas, LCSW 06/02/2019, 2:35 PM

## 2019-06-02 NOTE — Progress Notes (Signed)
Subjective: Postpartum Day #2: Cesarean Delivery Patient reports tolerating PO, + flatus and no problems voiding. Just voided 600cc. Minimal ambulation but no issues. Denies PreE symptoms. Baby is improving with latch, pumping coming slowly.     Objective: Vital signs in last 24 hours: Temp:  [98 F (36.7 C)-98.4 F (36.9 C)] 98.4 F (36.9 C) (10/27 0756) Pulse Rate:  [68-93] 93 (10/27 0756) Resp:  [16-18] 16 (10/27 0756) BP: (121-177)/(78-116) 138/88 (10/27 0756) SpO2:  [96 %-100 %] 97 % (10/27 0357)  Physical Exam:  General: alert Lochia: appropriate Uterine Fundus: firm Incision: pressure dressing C/D/I   Recent Labs    06/01/19 0515 06/01/19 1346  HGB 7.1* 7.8*  HCT 23.5* 25.9*    Assessment/Plan: 23yo now [id number] POD#2 s/p RLTCS+BTL for CHTN w/ SI-PreE w/ SF (Bps). Chronic anemia, h/o PTD. Meeting early postop requirement  1) CHTN w/ SI-PreE -PO meds to Procardia XL 60 mg bid and labetalol 400 mg po bid -MgSO4 to be completed 0830 this AM for 24hrs postop -BP last 12hrs 121-149/78-100  2) Chronic Anemia -Has chronic iron deficiency anemia with some acute blood loss anemia from c-section -10/26 1346 H/H 7.8/25.9, PO iron/colace while in-house; asx at this time   Baby in NICU, doing well for gestational age Remove pressure dressing this afternoon.   Eileen Miller 06/02/2019, 7:58 AM

## 2019-06-03 MED ORDER — OXYCODONE HCL 5 MG PO TABS: 5.0000 mg | ORAL_TABLET | Freq: Four times a day (QID) | ORAL | 0 refills | Status: DC | PRN

## 2019-06-03 MED ORDER — FLINSTONES GUMMIES OMEGA-3 DHA PO CHEW: 1.0000 | CHEWABLE_TABLET | Freq: Every day | ORAL | 3 refills | Status: AC

## 2019-06-03 MED ORDER — IBUPROFEN 800 MG PO TABS: 800.0000 mg | ORAL_TABLET | Freq: Three times a day (TID) | ORAL | 1 refills | Status: AC | PRN

## 2019-06-03 MED ORDER — NIFEDIPINE ER 60 MG PO TB24: 60.0000 mg | ORAL_TABLET | Freq: Two times a day (BID) | ORAL | 5 refills | Status: DC

## 2019-06-03 MED ORDER — LABETALOL HCL 200 MG PO TABS: 400.0000 mg | ORAL_TABLET | Freq: Two times a day (BID) | ORAL | 5 refills | Status: DC

## 2019-06-03 NOTE — Discharge Summary (Signed)
OB Discharge Summary     Patient Name: Eileen Miller DOB: [birth year] MRN: [id number]  Date of admission: 05/22/2019 Delivering MD: Pryor Ochoa [facility]   Date of discharge: 06/03/2019  Admitting diagnosis: 29WKS,ELEV BP Intrauterine pregnancy: [redacted]w[redacted]d     Secondary diagnosis:  Active Problems:   PIH (pregnancy induced hypertension)   Severe preeclampsia   Status post repeat low transverse cesarean section   History of bilateral tubal ligation  Additional problems: chronic hypertension, blood loss anemia     Discharge diagnosis: Preterm Pregnancy Delivered and CHTN with superimposed preeclampsia                                                                                                Post partum procedures:N/A  Augmentation: N/A  Complications: None  Hospital course:  Sceduled C/S   23 y.o. yo [phone] at [redacted]w[redacted]d was admitted to the hospital 05/22/2019 for scheduled cesarean section with the following indication:elevated BP despite medical therapy.  Membrane Rupture Time/Date: 2:59 PM ,05/31/2019   Patient delivered a Viable infant.05/31/2019  Details of operation can be found in separate operative note.  Pateint had an uncomplicated postpartum course.  She is ambulating, tolerating a regular diet, passing flatus, and urinating well. Patient is discharged home in stable condition on  06/03/19         Physical exam  Vitals:   06/02/19 1922 06/02/19 2305 06/03/19 0309 06/03/19 0740  BP: (!) 128/92 (!) 142/88 (!) 131/92 (!) 138/93  Pulse: 90 84 81 89  Resp: 17 18 18 18   Temp: 98.8 F (37.1 C) 98.4 F (36.9 C) 98 F (36.7 C) 98.4 F (36.9 C)  TempSrc: Oral Oral Oral Oral  SpO2: 100% 99% 100% 100%  Weight:      Height:       General: alert and no distress Lochia: appropriate Uterine Fundus: firm Incision: Healing well with no significant drainage DVT Evaluation: No evidence of DVT seen on physical exam. Labs: Lab Results  Component Value Date   WBC 11.7 (H)  06/01/2019   HGB 7.8 (L) 06/01/2019   HCT 25.9 (L) 06/01/2019   MCV 74.4 (L) 06/01/2019   PLT 261 06/01/2019   CMP Latest Ref Rng & Units 06/01/2019  Glucose 70 - 99 mg/dL 78  BUN 6 - 20 mg/dL 6  Creatinine 06/03/2019 - 9.73 mg/dL 5.32  Sodium 9.92 - 426 mmol/L 137  Potassium 3.5 - 5.1 mmol/L 3.2(L)  Chloride 98 - 111 mmol/L 103  CO2 22 - 32 mmol/L 24  Calcium 8.9 - 10.3 mg/dL 834)  Total Protein 6.5 - 8.1 g/dL 6.2(L)  Total Bilirubin 0.3 - 1.2 mg/dL 0.3  Alkaline Phos 38 - 126 U/L 71  AST 15 - 41 U/L 21  ALT 0 - 44 U/L 15    Discharge instruction: per After Visit Summary and "Baby and Me Booklet".  After visit meds:  Allergies as of 06/03/2019      Reactions   Food Swelling, Other (See Comments)   Peanut butter causes tongue swelling       Medication List    TAKE these  medications   ferrous sulfate 325 (65 FE) MG tablet Commonly known as: FerrouSul Take 1 tablet (325 mg total) by mouth 2 (two) times daily.   Flinstones Gummies Omega-3 DHA Chew Chew 1 tablet by mouth daily.   ibuprofen 800 MG tablet Commonly known as: ADVIL Take 1 tablet (800 mg total) by mouth every 8 (eight) hours as needed.   labetalol 200 MG tablet Commonly known as: NORMODYNE Take 2 tablets (400 mg total) by mouth 2 (two) times daily. What changed: how much to take   NIFEdipine 60 MG 24 hr tablet Commonly known as: ADALAT CC Take 1 tablet (60 mg total) by mouth 2 (two) times daily.   oxyCODONE 5 MG immediate release tablet Commonly known as: Oxy IR/ROXICODONE Take 1 tablet (5 mg total) by mouth every 6 (six) hours as needed for moderate pain.       Diet: routine diet  Activity: Advance as tolerated. Pelvic rest for 6 weeks.   Outpatient follow up:1,2, and 6 weeks Follow up Appt:No future appointments. Follow up Visit:No follow-ups on file.  Postpartum contraception: Tubal Ligation  Newborn Data: Live born female  Birth Weight: 3 lb 14.1 oz (1760 g) APGAR: 7, 8  Newborn  Delivery   Birth date/time: 05/31/2019 15:00:00 Delivery type: C-Section, Low Transverse Trial of labor: No C-section categorization: Repeat      Baby Feeding: Bottle and Breast Disposition:NICU   06/03/2019 Janyth Contes, MD

## 2019-06-03 NOTE — Progress Notes (Addendum)
Subjective: Postpartum Day 3: Cesarean Delivery Patient reports incisional pain and tolerating PO.  BP controlled with labetalol and procardia.  Objective: Vital signs in last 24 hours: Temp:  [98 F (36.7 C)-98.8 F (37.1 C)] 98.4 F (36.9 C) (10/28 0740) Pulse Rate:  [81-92] 89 (10/28 0740) Resp:  [16-18] 18 (10/28 0740) BP: (118-142)/(75-93) 138/93 (10/28 0740) SpO2:  [99 %-100 %] 100 % (10/28 0740)  Physical Exam:  General: alert and no distress Lochia: appropriate Uterine Fundus: firm Incision: healing well DVT Evaluation: No evidence of DVT seen on physical exam.  Recent Labs    06/01/19 0515 06/01/19 1346  HGB 7.1* 7.8*  HCT 23.5* 25.9*    Assessment/Plan: Status post Cesarean section. Doing well postoperatively.  Discharge home with standard precautions and return to clinic in 1,2 and 6 weeks.  D/C with Procardia bid, Labetalol bid, Motrin, oxycodone and PNV.  D/W pt normal pathology on BTL  Eileen Miller 06/03/2019, 8:43 AM

## 2019-06-03 NOTE — Lactation Note (Signed)
This note was copied from a baby's chart. Lactation Consultation Note  Patient Name: Eileen Miller Date: 06/03/2019 Reason for consult: Follow-up assessment;NICU baby Eileen Miller is 67 hours in the NICU.  Mom states she pumped a few times yesterday but did not obtain any milk.  Discussed milk coming to volume and the prevention and treatment of engorgement.  Instructed to pump every 3 hours to establish a good milk supply.  Mom plans on calling [facility] to arrange pick up of breast pump.  Reminded to pump with symphony pump in NICU when visiting baby.  No questions or concerns.  Maternal Data    Feeding    LATCH Score                   Interventions    Lactation Tools Discussed/Used     Consult Status Consult Status: PRN    Ave Filter 06/03/2019, 10:04 AM

## 2019-07-23 ENCOUNTER — Inpatient Hospital Stay (HOSPITAL_COMMUNITY): Admit: 2019-07-23 | Payer: Medicaid Other | Admitting: Obstetrics and Gynecology

## 2019-07-25 ENCOUNTER — Ambulatory Visit: Payer: Self-pay

## 2019-07-25 NOTE — Lactation Note (Signed)
This note was copied from a baby's chart. Lactation Consultation Note  Patient Name: Eileen Miller [id number] Date: 07/25/2019 Reason for consult: Follow-up assessment;NICU baby;Preterm <34wks  Spoke to NICU RN [facility] and she told LC that baby is being 100% formula fed, he's on Similac 30 calorie formula. Baby was on donor milk till Nov. 16th but then it was switch to formula. Mom has not been pumping or turning her EBM, LC services no longer needed.  Maternal Data    Feeding Feeding Type: Formula  LATCH Score                   Interventions    Lactation Tools Discussed/Used     Consult Status Consult Status: Complete Date: 07/25/19 Follow-up type: Call as needed    Eileen Miller [name] 07/25/2019, 11:45 AM

## 2019-08-04 ENCOUNTER — Ambulatory Visit: Payer: Medicaid Other | Attending: Internal Medicine

## 2019-08-04 DIAGNOSIS — U071 COVID-19: Secondary | ICD-10-CM

## 2019-08-05 LAB — NOVEL CORONAVIRUS, NAA: SARS-CoV-2, NAA: DETECTED — AB

## 2019-08-06 ENCOUNTER — Encounter: Payer: Self-pay | Admitting: *Deleted

## 2019-09-05 IMAGING — US US OB TRANSVAGINAL
1 series · 14 of 28 positions shown · non-contrast
Comparison: None.

CLINICAL DATA: Viability in pregnancy

EXAM:
OBSTETRIC <14 WK US AND TRANSVAGINAL OB US
TECHNIQUE: Both transabdominal and transvaginal ultrasound examinations were
performed for complete evaluation of the gestation as well as the
maternal uterus, adnexal regions, and pelvic cul-de-sac.
Transvaginal technique was performed to assess early pregnancy.

[Series 1: us ob transvaginal · 0.20mm/px · 14 of 53 slices shown]
[im 2/53]
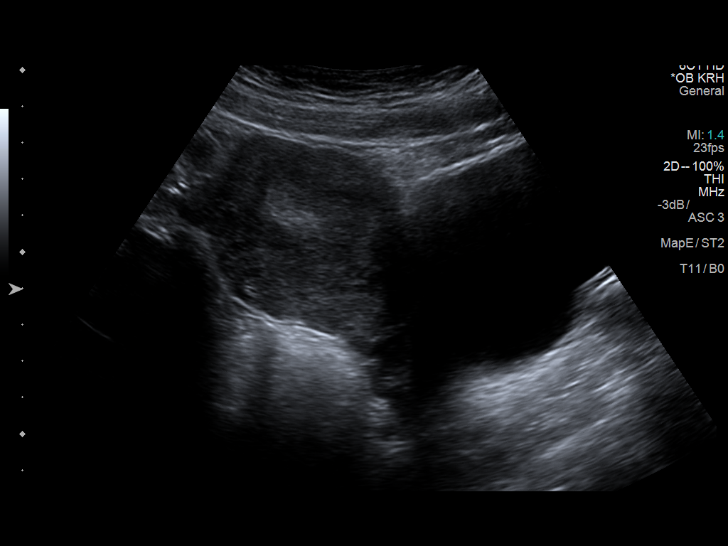
[im 6/53]
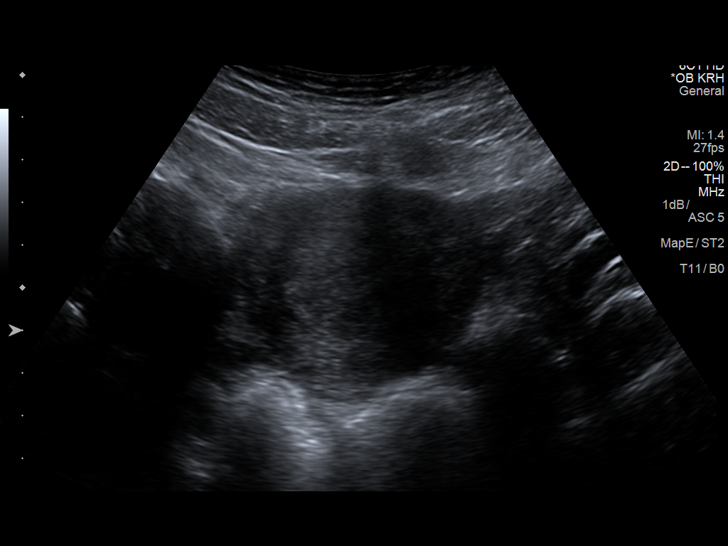
[im 10/53]
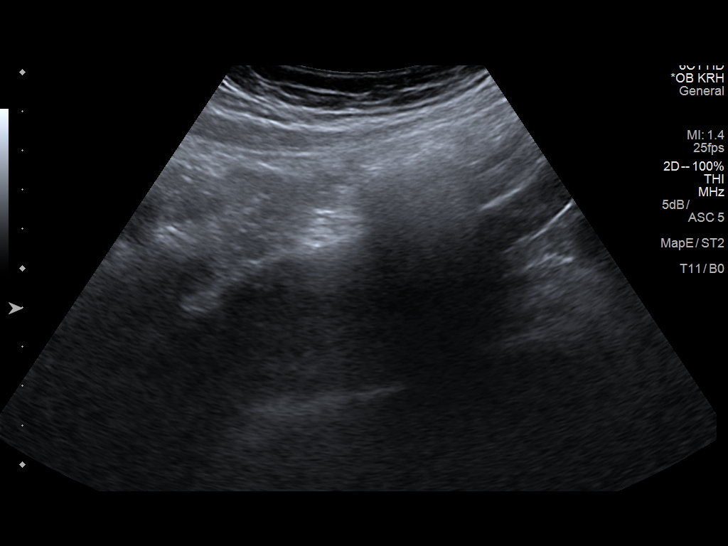
[im 14/53]
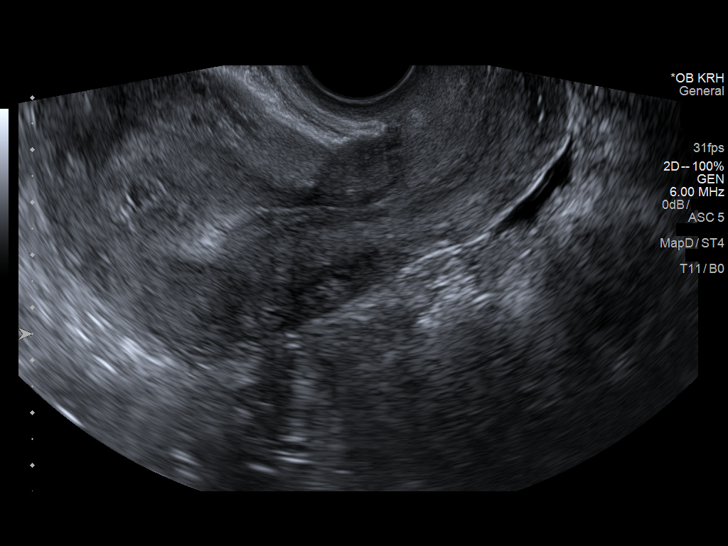
[im 18/53]
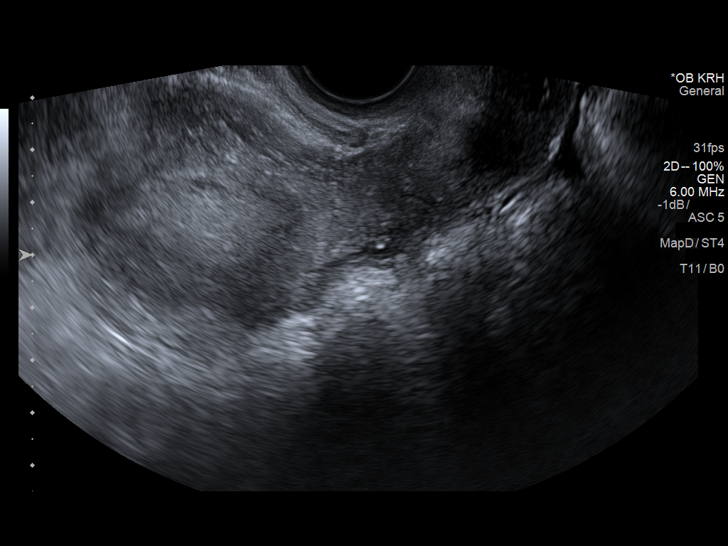
[im 22/53]
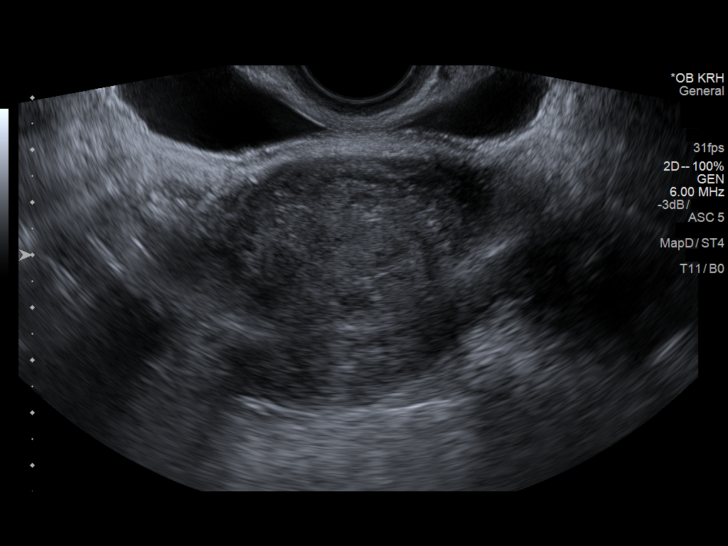
[im 26/53]
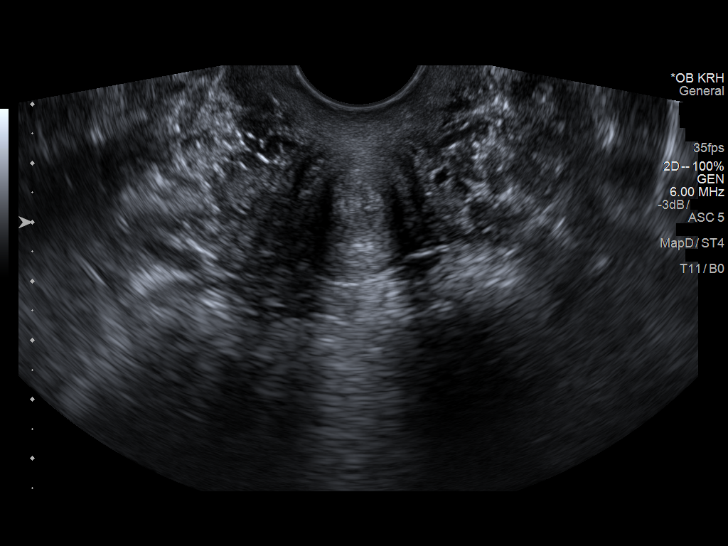
[im 29/53]
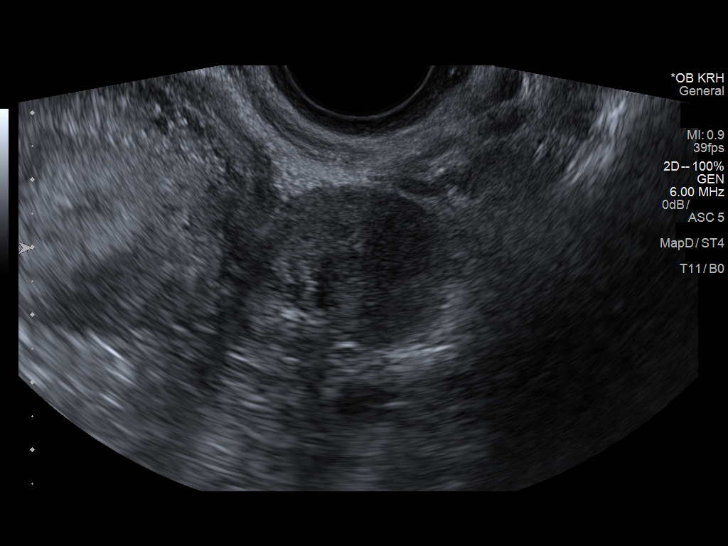
[im 33/53]
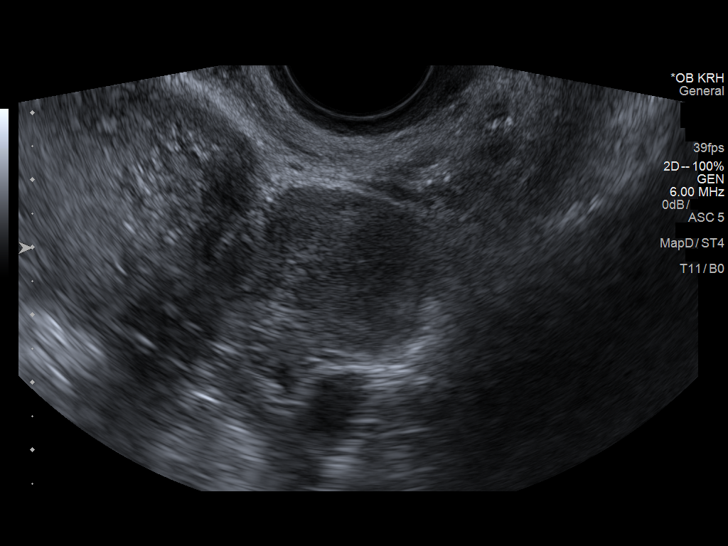
[im 37/53]
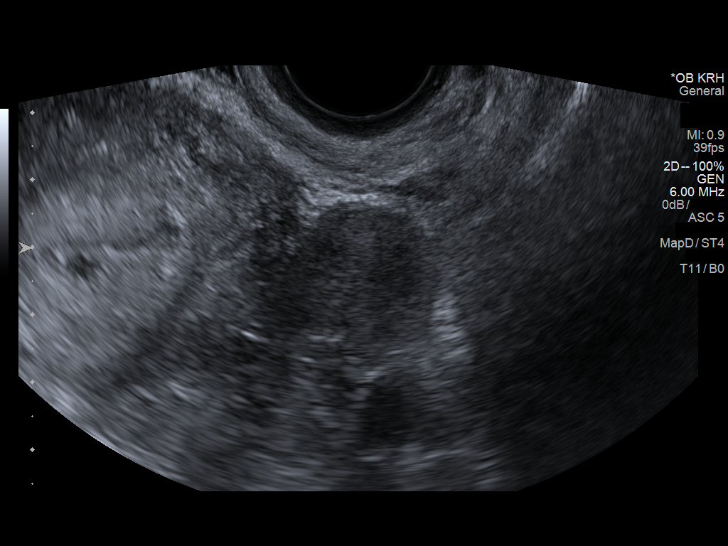
[im 41/53]
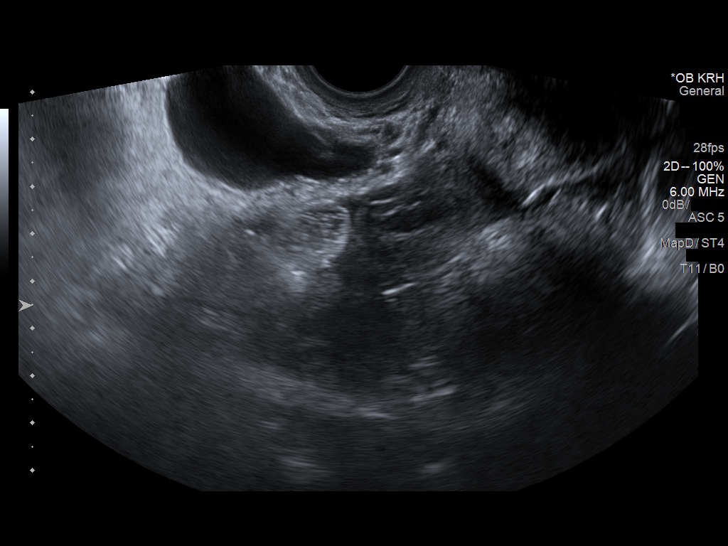
[im 45/53]
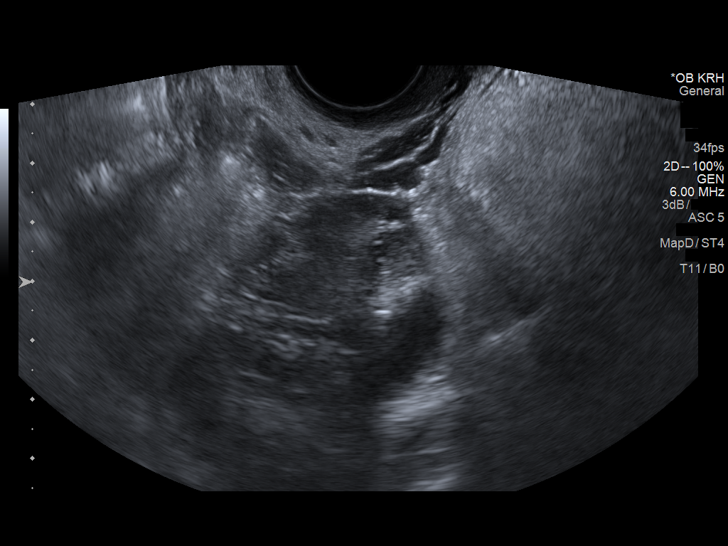
[im 49/53]
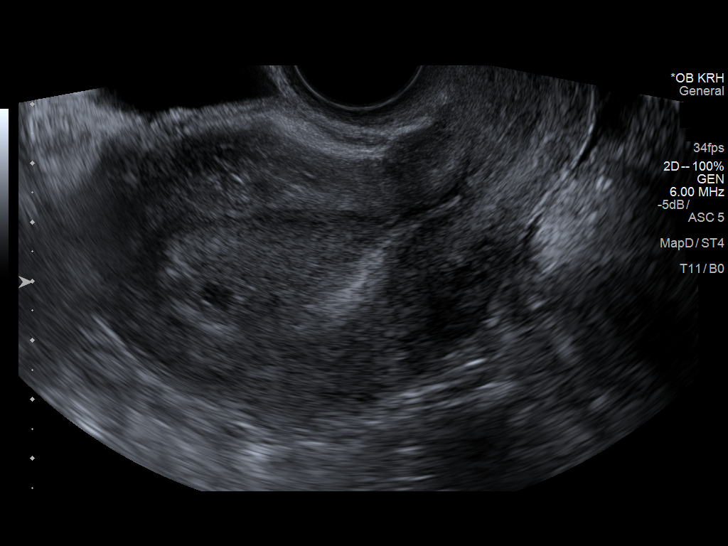
[im 53/53]
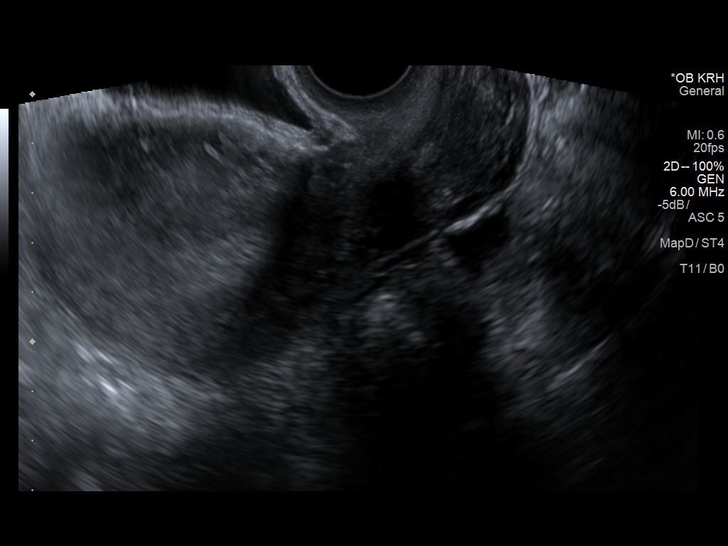

[14 of 28 positions shown; findings below may reference images not displayed]

FINDINGS: Intrauterine gestational sac: Bold tiny hypoechoic area within the
endometrium. Difficult to determine if this is a very early
gestational sac.

Yolk sac:  Not visualized

Embryo:  Not visualized

Cardiac Activity:

Heart Rate:   bpm

MSD:   mm    w     d

CRL:    mm    w    d                  US EDC:

Subchorionic hemorrhage:  N/A

Maternal uterus/adnexae: No adnexal mass. Trace free fluid in the
pelvis.
IMPRESSION: Small cystic area within the endometrium, too small to characterize
if this is a early intrauterine gestational sac. Recommend follow-up
ultrasound in 10-14 days.

## 2019-10-15 ENCOUNTER — Emergency Department (HOSPITAL_COMMUNITY)
Admission: EM | Admit: 2019-10-15 | Discharge: 2019-10-15 | Disposition: A | Discharge status: Home / Self Care | Payer: Medicaid Other | Attending: Emergency Medicine | Admitting: Emergency Medicine

## 2019-10-15 ENCOUNTER — Other Ambulatory Visit: Payer: Self-pay

## 2019-10-15 DIAGNOSIS — I10 Essential (primary) hypertension: Secondary | ICD-10-CM | POA: Insufficient documentation

## 2019-10-15 DIAGNOSIS — R42 Dizziness and giddiness: Secondary | ICD-10-CM | POA: Diagnosis not present

## 2019-10-15 DIAGNOSIS — Z79899 Other long term (current) drug therapy: Secondary | ICD-10-CM | POA: Diagnosis not present

## 2019-10-15 LAB — POC URINE PREG, ED: Preg Test, Ur: NEGATIVE

## 2019-10-15 MED ORDER — MECLIZINE HCL 25 MG PO TABS: 25.0000 mg | ORAL_TABLET | Freq: Three times a day (TID) | ORAL | 0 refills | Status: DC | PRN

## 2019-10-15 MED ORDER — MECLIZINE HCL 25 MG PO TABS
25.0000 mg | ORAL_TABLET | Freq: Once | ORAL | Status: AC
  Administered 2019-10-15: 25 mg via ORAL
  Filled 2019-10-15: qty 1

## 2019-10-15 NOTE — ED Triage Notes (Signed)
Pt arrives via [facility] c/o HTN. Pt is on labetalol 200mg  BID PO. C/O Hypertensive urgency with headache. VS: BP 198/124, HR 70, SPO2 98% RA, RR 20. A&Ox4, GCS 15.

## 2019-10-15 NOTE — Discharge Instructions (Signed)
As we discussed, it is very important you take your blood pressure medication.  He can take the meclizine as needed for dizziness.  Follow-up with your primary care doctor.  Return the emergency department for any high blood pressure, chest pain, difficulty breathing, numbness/weakness of arms or legs or any other worsening or concerning symptoms.

## 2019-10-15 NOTE — ED Provider Notes (Signed)
[facility] EMERGENCY DEPARTMENT Provider Note   CSN: [id number] Arrival date & time: 10/15/19  0020     History Chief Complaint  Patient presents with  . Hypertension    Eileen Miller is a 24 y.o. female brought in by EMS for evaluation of hypertension.  Patient reports that earlier this evening, she was doing homework.  Patient reports that she became very anxious since she thinks she may have had a panic attack.  She states she has had them before and symptoms felt similar.  Patient states that she got dizzy, felt hot, anxious.  Patient felt like everything was closing in.  She had some mild chest pain and shortness of breath at that time.  She checked her blood pressure noted to be elevated with systolic blood pressure in the 190s.  Patient states she became concerned and called EMS.  She states that when EMS got there, she developed a mild headache.  She states that since being here in the ED, she feels much better.  No chest pain, shortness of breath.  She states that the headache has improved on its own.  She did take 200 g of labetalol prior to coming to the ED.  She reports she still feels slightly dizzy.  She states when she lays still she feels fine but when she moves her head or gets up, she feels slightly dizzy like things are spinning.  She denies any vision changes, numbness/weakness of her arms or legs.   The history is provided by the patient.       Past Medical History:  Diagnosis Date  . History of bilateral tubal ligation 05/31/2019  . History of cesarean section 03/03/2016   For severe Preeclampsia. Did not labor.   . History of preterm delivery 03/03/2016   For severe Preeclampsia. Did not labor.   . Hypertension   . Iron deficiency anemia due to chronic blood loss 08/21/2018  . Severe preeclampsia 2015  . Severe preeclampsia 05/31/2019    Patient Active Problem List   Diagnosis Date Noted  . Severe preeclampsia 05/31/2019  . Status post  repeat low transverse cesarean section 05/31/2019  . History of bilateral tubal ligation 05/31/2019  . PIH (pregnancy induced hypertension) 05/22/2019  . Supervision of high risk pregnancy, antepartum 02/03/2019  . Chronic hypertension during pregnancy, antepartum 02/03/2019  . History of pre-eclampsia in prior pregnancy, currently pregnant 02/03/2019  . Iron deficiency anemia due to chronic blood loss 08/21/2018  . Symptomatic anemia 08/21/2018  . S/P C-section 10/20/2017  . Hypokalemia 10/17/2017  . Chronic hypertension with superimposed severe preeclampsia 10/16/2017  . Anemia 05/20/2017  . History of cesarean section 03/03/2016  . History of preterm delivery 03/03/2016  . HTN (hypertension) 05/19/2012    Past Surgical History:  Procedure Laterality Date  . CESAREAN SECTION  2015  . CESAREAN SECTION N/A 10/20/2017   Procedure: CESAREAN SECTION;  Surgeon: Florian Buff, MD;  Location: [facility];  Service: Obstetrics;  Laterality: N/A;  . CESAREAN SECTION N/A 05/31/2019   Procedure: REPEAT CESAREAN SECTION WITH BILATERAL TUBAL LIGATION;  Surgeon: Sherlyn Hay, DO;  Location: [facility];  Service: Obstetrics;  Laterality: N/A;  . EYE SURGERY    . HERNIA REPAIR       OB History    Gravida  4   Para  3   Term  0   Preterm  3   AB  1   Living  3  SAB  0   TAB  1   Ectopic  0   Multiple  0   Live Births  3           Family History  Problem Relation Age of Onset  . Hypertension Father   . Hypertension Maternal Grandmother   . Hypertension Paternal Grandmother     Social History   Tobacco Use  . Smoking status: Never Smoker  . Smokeless tobacco: Never Used  Substance Use Topics  . Alcohol use: Not Currently    Comment: not while pregnant  . Drug use: No    Home Medications Prior to Admission medications   Medication Sig Start Date End Date Taking? Authorizing Provider  ferrous sulfate (FERROUSUL) 325 (65 FE) MG tablet Take 1  tablet (325 mg total) by mouth 2 (two) times daily. Patient not taking: Reported on 02/03/2019 08/22/18   Purohit, Salli Quarry, MD  ibuprofen (ADVIL) 800 MG tablet Take 1 tablet (800 mg total) by mouth every 8 (eight) hours as needed. 06/03/19   Bovard-Stuckert, Augusto Gamble, MD  labetalol (NORMODYNE) 200 MG tablet Take 2 tablets (400 mg total) by mouth 2 (two) times daily. 06/03/19   Bovard-Stuckert, Augusto Gamble, MD  meclizine (ANTIVERT) 25 MG tablet Take 1 tablet (25 mg total) by mouth 3 (three) times daily as needed for dizziness. 10/15/19   Maxwell Caul, PA-C  NIFEdipine (ADALAT CC) 60 MG 24 hr tablet Take 1 tablet (60 mg total) by mouth 2 (two) times daily. 06/03/19   Bovard-Stuckert, Augusto Gamble, MD  oxyCODONE (OXY IR/ROXICODONE) 5 MG immediate release tablet Take 1 tablet (5 mg total) by mouth every 6 (six) hours as needed for moderate pain. 06/03/19   Bovard-Stuckert, Augusto Gamble, MD  Pediatric Multiple Vit-C-FA (FLINSTONES GUMMIES OMEGA-3 DHA) CHEW Chew 1 tablet by mouth daily. 06/03/19   Bovard-Stuckert, Augusto Gamble, MD    Allergies    Food  Review of Systems   Review of Systems  Constitutional: Negative for fever.  Eyes: Negative for visual disturbance.  Respiratory: Negative for cough and shortness of breath.   Cardiovascular: Negative for chest pain.  Gastrointestinal: Negative for abdominal pain, nausea and vomiting.  Genitourinary: Negative for dysuria and hematuria.  Neurological: Positive for dizziness and headaches (resolved). Negative for weakness and numbness.  Psychiatric/Behavioral: The patient is nervous/anxious.   All other systems reviewed and are negative.   Physical Exam Updated Vital Signs BP (!) 169/125 (BP Location: Left Arm)   Pulse 88   Temp 97.8 F (36.6 C) (Oral)   Resp 20   Ht 5\' 5"  (1.651 m)   Wt 82 kg   SpO2 100%   BMI 30.08 kg/m   Physical Exam Vitals and nursing note reviewed.  Constitutional:      Appearance: Normal appearance. She is well-developed.  HENT:     Head:  Normocephalic and atraumatic.  Eyes:     General: Lids are normal.     Conjunctiva/sclera: Conjunctivae normal.     Pupils: Pupils are equal, round, and reactive to light.     Comments: PERRL. EOMs intact. No nystagmus. No neglect.   Cardiovascular:     Rate and Rhythm: Normal rate and regular rhythm.     Pulses: Normal pulses.          Radial pulses are 2+ on the right side and 2+ on the left side.     Heart sounds: Normal heart sounds. No murmur. No friction rub. No gallop.   Pulmonary:     Effort: Pulmonary  effort is normal.     Breath sounds: Normal breath sounds.     Comments: Lungs clear to auscultation bilaterally.  Symmetric chest rise.  No wheezing, rales, rhonchi. Abdominal:     Palpations: Abdomen is soft. Abdomen is not rigid.     Tenderness: There is no abdominal tenderness. There is no guarding.  Musculoskeletal:        General: Normal range of motion.     Cervical back: Full passive range of motion without pain.  Skin:    General: Skin is warm and dry.     Capillary Refill: Capillary refill takes less than 2 seconds.  Neurological:     Mental Status: She is alert and oriented to person, place, and time.     Comments: Cranial nerves III-XII intact Follows commands, Moves all extremities  5/5 strength to BUE and BLE  Sensation intact throughout all major nerve distributions No slurred speech. No facial droop.   Psychiatric:        Speech: Speech normal.     ED Results / Procedures / Treatments   Labs (all labs ordered are listed, but only abnormal results are displayed) Labs Reviewed  POC URINE PREG, ED    EKG EKG Interpretation  Date/Time:  Thursday October 15 2019 03:03:52 EST Ventricular Rate:  73 PR Interval:    QRS Duration: 97 QT Interval:  395 QTC Calculation: 436 R Axis:   85 Text Interpretation: Sinus rhythm Probable left atrial enlargement Borderline T wave abnormalities No significant change since last tracing Confirmed by Gilda Crease [phone]) on 10/15/2019 3:34:51 AM   Radiology No results found.  Procedures Procedures (including critical care time)  Medications Ordered in ED Medications  meclizine (ANTIVERT) tablet 25 mg (25 mg Oral Given 10/15/19 0340)    ED Course  I have reviewed the triage vital signs and the nursing notes.  Pertinent labs & imaging results that were available during my care of the patient were reviewed by me and considered in my medical decision making (see chart for details).    MDM Rules/Calculators/A&P                      24 year old female who presents for evaluation of hypertension.  Reports she was at home doing her homework when all of a sudden she, felt flushed, dizzy.  She reports she had some chest pain and felt very anxious.  She states she felt like she had a panic attack.  She checked her blood pressure noted to be elevated.  She continued to check it and states that it was not going down even after taking her blood pressure medications, prompting ED visit.  On ED arrival, she reports symptoms have improved.  No chest pain, redness of breath, numbness/weakness of arms or legs, visual changes.  She states she still feel slightly dizzy, vertically when she gets up and moves around.  On initially arrival, she is afebrile, nontoxic-appearing.  Blood pressure is slightly elevated with systolic in the 160s.  Normal neuro exam.  History/physical exam concerning for hypertensive emergency.  Do not suspect ACS etiology.  Additionally, history/physical exam is not concerning for CVA, posterior circulation stroke.  We will plan to give meclizine for dizziness and observe patient here in the ED.  Reevaluation.  Patient's blood pressure has improved is 116/125.  Patient reports that she feels better.  Currently denies any symptoms at this time.  Reports dizziness is completely resolved.  Denies any chest pain,  difficulty breathing, headache, numbness/weakness of arms or legs.  I  personally ambulated patient in the ED with no signs of gait ataxia.  She had negative Romberg.  At this time, her exam is not concerning for CVA/posterior circulation stroke.  Patient states she is ready to go home.  I discussed with her that her exam is reassuring.  I will plan to send her home with some meclizine.  I discussed with Dr. Blinda Leatherwood who is in agreement to plan. At this time, patient exhibits no emergent life-threatening condition that require further evaluation in ED or admission. Patient had ample opportunity for questions and discussion. All patient's questions were answered with full understanding. Strict return precautions discussed. Patient expresses understanding and agreement to plan.   Portions of this note were generated with Scientist, clinical (histocompatibility and immunogenetics). Dictation errors may occur despite best attempts at proofreading.  Final Clinical Impression(s) / ED Diagnoses Final diagnoses:  Essential hypertension  Dizziness    Rx / DC Orders ED Discharge Orders         Ordered    meclizine (ANTIVERT) 25 MG tablet  3 times daily PRN     10/15/19 0616           Maxwell Caul, PA-C 10/15/19 0715    Gilda Crease, MD 10/15/19 0730

## 2019-10-15 NOTE — ED Notes (Signed)
CC with Pt now

## 2019-12-29 ENCOUNTER — Encounter (HOSPITAL_COMMUNITY): Payer: Self-pay | Admitting: *Deleted

## 2019-12-29 ENCOUNTER — Other Ambulatory Visit: Payer: Self-pay

## 2019-12-29 ENCOUNTER — Emergency Department (HOSPITAL_COMMUNITY): Payer: Medicaid Other

## 2019-12-29 DIAGNOSIS — R0789 Other chest pain: Secondary | ICD-10-CM | POA: Diagnosis present

## 2019-12-29 DIAGNOSIS — Z5321 Procedure and treatment not carried out due to patient leaving prior to being seen by health care provider: Secondary | ICD-10-CM | POA: Diagnosis not present

## 2019-12-29 DIAGNOSIS — R0602 Shortness of breath: Secondary | ICD-10-CM | POA: Diagnosis not present

## 2019-12-29 LAB — CBC
HCT: 31.6 % — ABNORMAL LOW (ref 36.0–46.0)
Hemoglobin: 9.7 g/dL — ABNORMAL LOW (ref 12.0–15.0)
MCH: 24.6 pg — ABNORMAL LOW (ref 26.0–34.0)
MCHC: 30.7 g/dL (ref 30.0–36.0)
MCV: 80 fL (ref 80.0–100.0)
Platelets: 392 10*3/uL (ref 150–400)
RBC: 3.95 MIL/uL (ref 3.87–5.11)
RDW: 13.9 % (ref 11.5–15.5)
WBC: 4.7 10*3/uL (ref 4.0–10.5)
nRBC: 0 % (ref 0.0–0.2)

## 2019-12-29 LAB — I-STAT BETA HCG BLOOD, ED (NOT ORDERABLE): I-stat hCG, quantitative: <5 m[IU]/mL (ref <5)

## 2019-12-29 MED ORDER — SODIUM CHLORIDE 0.9% FLUSH: 3.0000 mL | Freq: Once | INTRAVENOUS | Status: DC

## 2019-12-29 NOTE — ED Notes (Signed)
Save blue tube in main lab °

## 2019-12-29 NOTE — ED Triage Notes (Signed)
Chest pain and shortness breath, has had similar in the past. HTN in triage, states taking meds as prescribed.

## 2019-12-30 ENCOUNTER — Emergency Department (HOSPITAL_COMMUNITY)
Admission: EM | Admit: 2019-12-30 | Discharge: 2019-12-30 | Disposition: A | Discharge status: Home / Self Care | Payer: Medicaid Other | Attending: Emergency Medicine | Admitting: Emergency Medicine

## 2019-12-30 LAB — BASIC METABOLIC PANEL
Anion gap: 7 (ref 5–15)
BUN: 25 mg/dL — ABNORMAL HIGH (ref 6–20)
CO2: 27 mmol/L (ref 22–32)
Calcium: 9.6 mg/dL (ref 8.9–10.3)
Chloride: 104 mmol/L (ref 98–111)
Creatinine, Ser: 0.86 mg/dL (ref 0.44–1.00)
GFR calc Af Amer: >60 mL/min (ref >60)
GFR calc non Af Amer: >60 mL/min (ref >60)
Glucose, Bld: 97 mg/dL (ref 70–99)
Potassium: 3.5 mmol/L (ref 3.5–5.1)
Sodium: 138 mmol/L (ref 135–145)

## 2019-12-30 LAB — TROPONIN I (HIGH SENSITIVITY): Troponin I (High Sensitivity): <2 ng/L (ref <18)

## 2019-12-30 NOTE — ED Notes (Signed)
No answer to take to treatment room  

## 2020-01-29 ENCOUNTER — Telehealth: Payer: Self-pay | Admitting: *Deleted

## 2020-01-29 NOTE — Telephone Encounter (Signed)
Received refill request for Lisinopril-HCTZ which was declined due to patient per chart is not a patient of our office any longer, was delivered by Dr. Mindi Slicker.  Mihir Flanigan,RN

## 2020-02-29 ENCOUNTER — Ambulatory Visit: Payer: Medicaid Other | Attending: Internal Medicine

## 2020-02-29 DIAGNOSIS — Z23 Encounter for immunization: Secondary | ICD-10-CM

## 2020-02-29 NOTE — Progress Notes (Signed)
   Covid-19 Vaccination Clinic  Name:  Bradi Arbuthnot    MRN: [id number] DOB: 21-Apr-1996  02/29/2020  Ms. Wunschel was observed post Covid-19 immunization for 15 minutes without incident. She was provided with Vaccine Information Sheet and instruction to access the V-Safe system.   Ms. Vreeland was instructed to call 911 with any severe reactions post vaccine: Marland Kitchen Difficulty breathing  . Swelling of face and throat  . A fast heartbeat  . A bad rash all over body  . Dizziness and weakness   Immunizations Administered    Name Date Dose VIS Date Route   Pfizer COVID-19 Vaccine 02/29/2020  5:04 PM 0.3 mL 09/30/2018 Intramuscular   Manufacturer: [employer], [employer]   Lot: [id number]   NDC: [address]-1

## 2020-03-29 ENCOUNTER — Ambulatory Visit: Payer: Medicaid Other

## 2020-04-12 ENCOUNTER — Ambulatory Visit: Payer: Medicaid Other | Attending: Internal Medicine

## 2020-04-12 DIAGNOSIS — Z23 Encounter for immunization: Secondary | ICD-10-CM

## 2020-04-12 NOTE — Progress Notes (Signed)
   Covid-19 Vaccination Clinic  Name:  Eileen Miller    MRN: [id number] DOB: 07-21-1996  04/12/2020  [name] was observed post Covid-19 immunization for 15 minutes without incident. She was provided with Vaccine Information Sheet and instruction to access the V-Safe system.   [name] was instructed to call 911 with any severe reactions post vaccine: Marland Kitchen Difficulty breathing  . Swelling of face and throat  . A fast heartbeat  . A bad rash all over body  . Dizziness and weakness   Immunizations Administered    Name Date Dose VIS Date Route   Pfizer COVID-19 Vaccine 04/12/2020  3:29 PM 0.3 mL 09/30/2018 Intramuscular   Manufacturer: [employer], [employer]   Lot: [id number]   NDC: [id number]

## 2022-04-19 ENCOUNTER — Emergency Department (HOSPITAL_COMMUNITY): Payer: Medicaid Other

## 2022-04-19 ENCOUNTER — Emergency Department (HOSPITAL_COMMUNITY)
Admission: EM | Admit: 2022-04-19 | Discharge: 2022-04-19 | Disposition: A | Discharge status: Home / Self Care | Payer: Medicaid Other | Attending: Emergency Medicine | Admitting: Emergency Medicine

## 2022-04-19 ENCOUNTER — Encounter (HOSPITAL_COMMUNITY): Payer: Self-pay | Admitting: Emergency Medicine

## 2022-04-19 ENCOUNTER — Other Ambulatory Visit: Payer: Self-pay

## 2022-04-19 DIAGNOSIS — R519 Headache, unspecified: Secondary | ICD-10-CM | POA: Insufficient documentation

## 2022-04-19 DIAGNOSIS — I10 Essential (primary) hypertension: Secondary | ICD-10-CM | POA: Diagnosis not present

## 2022-04-19 DIAGNOSIS — E876 Hypokalemia: Secondary | ICD-10-CM | POA: Diagnosis not present

## 2022-04-19 DIAGNOSIS — R42 Dizziness and giddiness: Secondary | ICD-10-CM | POA: Diagnosis present

## 2022-04-19 DIAGNOSIS — Z79899 Other long term (current) drug therapy: Secondary | ICD-10-CM | POA: Diagnosis not present

## 2022-04-19 LAB — BASIC METABOLIC PANEL
Anion gap: 10 (ref 5–15)
BUN: 16 mg/dL (ref 6–20)
CO2: 28 mmol/L (ref 22–32)
Calcium: 10.2 mg/dL (ref 8.9–10.3)
Chloride: 102 mmol/L (ref 98–111)
Creatinine, Ser: 0.84 mg/dL (ref 0.44–1.00)
GFR, Estimated: >60 mL/min (ref >60)
Glucose, Bld: 95 mg/dL (ref 70–99)
Potassium: 2.4 mmol/L — CL (ref 3.5–5.1)
Sodium: 140 mmol/L (ref 135–145)

## 2022-04-19 LAB — CBC WITH DIFFERENTIAL/PLATELET
Abs Immature Granulocytes: 0.02 10*3/uL (ref 0.00–0.07)
Basophils Absolute: 0 10*3/uL (ref 0.0–0.1)
Basophils Relative: 1 %
Eosinophils Absolute: 0.1 10*3/uL (ref 0.0–0.5)
Eosinophils Relative: 1 %
HCT: 33.7 % — ABNORMAL LOW (ref 36.0–46.0)
Hemoglobin: 10.9 g/dL — ABNORMAL LOW (ref 12.0–15.0)
Immature Granulocytes: 1 %
Lymphocytes Relative: 38 %
Lymphs Abs: 1.7 10*3/uL (ref 0.7–4.0)
MCH: 24.8 pg — ABNORMAL LOW (ref 26.0–34.0)
MCHC: 32.3 g/dL (ref 30.0–36.0)
MCV: 76.6 fL — ABNORMAL LOW (ref 80.0–100.0)
Monocytes Absolute: 0.3 10*3/uL (ref 0.1–1.0)
Monocytes Relative: 6 %
Neutro Abs: 2.4 10*3/uL (ref 1.7–7.7)
Neutrophils Relative %: 53 %
Platelets: 354 10*3/uL (ref 150–400)
RBC: 4.4 MIL/uL (ref 3.87–5.11)
RDW: 15.5 % (ref 11.5–15.5)
WBC: 4.4 10*3/uL (ref 4.0–10.5)
nRBC: 0 % (ref 0.0–0.2)

## 2022-04-19 LAB — I-STAT BETA HCG BLOOD, ED (MC, WL, AP ONLY): I-stat hCG, quantitative: <5 m[IU]/mL (ref <5)

## 2022-04-19 LAB — MAGNESIUM: Magnesium: 1.9 mg/dL (ref 1.7–2.4)

## 2022-04-19 MED ORDER — MAGNESIUM SULFATE 2 GM/50ML IV SOLN
2.0000 g | Freq: Once | INTRAVENOUS | Status: AC
  Administered 2022-04-19: 2 g via INTRAVENOUS
  Filled 2022-04-19: qty 50

## 2022-04-19 MED ORDER — POTASSIUM CHLORIDE 10 MEQ/100ML IV SOLN
10.0000 meq | INTRAVENOUS | Status: DC
  Administered 2022-04-19: 10 meq via INTRAVENOUS
  Filled 2022-04-19: qty 100

## 2022-04-19 MED ORDER — POTASSIUM CHLORIDE CRYS ER 20 MEQ PO TBCR
80.0000 meq | EXTENDED_RELEASE_TABLET | Freq: Once | ORAL | Status: AC
  Administered 2022-04-19: 80 meq via ORAL
  Filled 2022-04-19: qty 4

## 2022-04-19 MED ORDER — POTASSIUM CHLORIDE CRYS ER 20 MEQ PO TBCR: 20.0000 meq | EXTENDED_RELEASE_TABLET | Freq: Two times a day (BID) | ORAL | 0 refills | Status: DC

## 2022-04-19 MED ORDER — POTASSIUM CHLORIDE 10 MEQ/100ML IV SOLN
10.0000 meq | Freq: Once | INTRAVENOUS | Status: AC
  Administered 2022-04-19: 10 meq via INTRAVENOUS
  Filled 2022-04-19: qty 100

## 2022-04-19 NOTE — ED Provider Triage Note (Signed)
Emergency Medicine Provider Triage Evaluation Note  Eileen Miller , a 26 y.o. female  was evaluated in triage.  Pt complains of dizziness and headache.  Patient reports she woke up with room spinning dizziness that woke her out of her sleep.  Dizziness lasted for about 3 hours and immediately his dizziness started to improve she started to develop a gradual pressure-like headache over the front of her head.  She reports that she has had similar headaches before but never associated with dizziness.  No visual changes, numbness or weakness.  Review of Systems  Positive: Headache, dizziness Negative: Visual changes, numbness, weakness, chest pain, shortness of breath  Physical Exam  BP (!) 133/105 (BP Location: Right Arm)   Pulse (!) 50   Temp 98 F (36.7 C)   Resp 16   SpO2 100%  Gen:   Awake, no distress   Resp:  Normal effort  MSK:   Moves extremities without difficulty  Other:  No focal neurologic deficits  Medical Decision Making  Medically screening exam initiated at 9:14 AM.  Appropriate orders placed.  [name] was informed that the remainder of the evaluation will be completed by another provider, this initial triage assessment does not replace that evaluation, and the importance of remaining in the ED until their evaluation is complete.  Dizziness resolving, continues to have headache, concern for potential complicated migraine.  We will get absent CT of the head.  Would benefit from migraine cocktail.   [name], [place] 04/19/22 [phone]

## 2022-04-19 NOTE — ED Provider Notes (Signed)
[facility] EMERGENCY DEPARTMENT Provider Note   CSN: [id number] Arrival date & time: 04/19/22  6734     History  Chief Complaint  Patient presents with   Dizziness   Headache    Eileen Miller is a 26 y.o. female with medical history of iron deficiency anemia, hypertension.  Patient presents to the ED for evaluation of dizziness and headache.  The patient reports that this morning she woke up at 6 AM with dizziness described as the room spinning.  The patient states this has occurred before in the past when she was having a vertigo spell.  The patient states that at this time she took her meclizine and then transported herself to the ED for evaluation.  The patient reports that upon arrival to the ED her headache and her dizziness subsided.  Patient currently complaint free.  The patient denies any fevers, nausea, vomiting, diarrhea, lightheadedness, dizziness, weakness.  Patient denies any chest pain or shortness of breath.   Dizziness Associated symptoms: no chest pain, no diarrhea, no headaches, no nausea, no shortness of breath, no vomiting and no weakness   Headache Associated symptoms: no diarrhea, no dizziness, no fever, no nausea, no vomiting and no weakness        Home Medications Prior to Admission medications   Medication Sig Start Date End Date Taking? Authorizing Provider  potassium chloride SA (KLOR-CON M) 20 MEQ tablet Take 1 tablet (20 mEq total) by mouth 2 (two) times daily. 04/19/22  Yes Al Decant, PA-C  ferrous sulfate (FERROUSUL) 325 (65 FE) MG tablet Take 1 tablet (325 mg total) by mouth 2 (two) times daily. Patient not taking: Reported on 02/03/2019 08/22/18   Purohit, Salli Quarry, MD  ibuprofen (ADVIL) 800 MG tablet Take 1 tablet (800 mg total) by mouth every 8 (eight) hours as needed. 06/03/19   Bovard-Stuckert, Augusto Gamble, MD  labetalol (NORMODYNE) 200 MG tablet Take 2 tablets (400 mg total) by mouth 2 (two) times daily. 06/03/19    Bovard-Stuckert, Augusto Gamble, MD  meclizine (ANTIVERT) 25 MG tablet Take 1 tablet (25 mg total) by mouth 3 (three) times daily as needed for dizziness. 10/15/19   Maxwell Caul, PA-C  NIFEdipine (ADALAT CC) 60 MG 24 hr tablet Take 1 tablet (60 mg total) by mouth 2 (two) times daily. 06/03/19   Bovard-Stuckert, Augusto Gamble, MD  oxyCODONE (OXY IR/ROXICODONE) 5 MG immediate release tablet Take 1 tablet (5 mg total) by mouth every 6 (six) hours as needed for moderate pain. 06/03/19   Bovard-Stuckert, Augusto Gamble, MD  Pediatric Multiple Vit-C-FA (FLINSTONES GUMMIES OMEGA-3 DHA) CHEW Chew 1 tablet by mouth daily. 06/03/19   Bovard-Stuckert, Augusto Gamble, MD      Allergies    Food    Review of Systems   Review of Systems  Constitutional:  Negative for fever.  Respiratory:  Negative for shortness of breath.   Cardiovascular:  Negative for chest pain.  Gastrointestinal:  Negative for diarrhea, nausea and vomiting.  Neurological:  Negative for dizziness, weakness, light-headedness and headaches.  All other systems reviewed and are negative.   Physical Exam Updated Vital Signs BP (!) 128/90 (BP Location: Left Arm)   Pulse (!) 51   Temp 98.9 F (37.2 C) (Oral)   Resp 16   Ht 5\' 7"  (1.702 m)   Wt 86.2 kg   LMP 03/29/2022   SpO2 100%   BMI 29.76 kg/m  Physical Exam Vitals and nursing note reviewed.  Constitutional:      General: She is  not in acute distress.    Appearance: Normal appearance. She is not ill-appearing, toxic-appearing or diaphoretic.  HENT:     Head: Normocephalic and atraumatic.     Nose: Nose normal. No congestion.     Mouth/Throat:     Mouth: Mucous membranes are moist.     Pharynx: Oropharynx is clear.  Eyes:     Extraocular Movements: Extraocular movements intact.     Conjunctiva/sclera: Conjunctivae normal.     Pupils: Pupils are equal, round, and reactive to light.  Cardiovascular:     Rate and Rhythm: Normal rate and regular rhythm.  Pulmonary:     Effort: Pulmonary effort is  normal.     Breath sounds: Normal breath sounds. No wheezing.  Abdominal:     General: Abdomen is flat. Bowel sounds are normal.     Palpations: Abdomen is soft.     Tenderness: There is no abdominal tenderness.  Musculoskeletal:     Cervical back: Normal range of motion and neck supple. No tenderness.  Skin:    Capillary Refill: Capillary refill takes less than 2 seconds.  Neurological:     General: No focal deficit present.     Mental Status: She is alert and oriented to person, place, and time.     GCS: GCS eye subscore is 4. GCS verbal subscore is 5. GCS motor subscore is 6.     Cranial Nerves: Cranial nerves 2-12 are intact. No cranial nerve deficit.     Sensory: Sensation is intact. No sensory deficit.     Motor: Motor function is intact. No weakness.     Coordination: Coordination is intact. Heel to [facility] Test normal.     ED Results / Procedures / Treatments   Labs (all labs ordered are listed, but only abnormal results are displayed) Labs Reviewed  CBC WITH DIFFERENTIAL/PLATELET - Abnormal; Notable for the following components:      Result Value   Hemoglobin 10.9 (*)    HCT 33.7 (*)    MCV 76.6 (*)    MCH 24.8 (*)    All other components within normal limits  BASIC METABOLIC PANEL - Abnormal; Notable for the following components:   Potassium 2.4 (*)    All other components within normal limits  MAGNESIUM  I-STAT BETA HCG BLOOD, ED ([facility], [facility], [facility] ONLY)    EKG None  Radiology CT Head Wo Contrast  Result Date: 04/19/2022 CLINICAL DATA:  Headache, new or worsening, neuro deficit (Age 37-49y) EXAM: CT HEAD WITHOUT CONTRAST TECHNIQUE: Contiguous axial images were obtained from the base of the skull through the vertex without intravenous contrast. RADIATION DOSE REDUCTION: This exam was performed according to the departmental dose-optimization program which includes automated exposure control, adjustment of the mA and/or kV according to patient size and/or use of iterative  reconstruction technique. COMPARISON:  None Available. FINDINGS: Brain: No evidence of acute infarction, hemorrhage, hydrocephalus, extra-axial collection or mass lesion/mass effect. Vascular: No hyperdense vessel identified. Skull: No acute fracture. Sinuses/Orbits: Visualized sinuses are clear. No acute orbital findings. Other: No mastoid effusions. IMPRESSION: No evidence of acute intracranial abnormality. Electronically Signed   By: Feliberto Harts M.D.   On: 04/19/2022 10:05    Procedures Procedures   Medications Ordered in ED Medications  potassium chloride SA (KLOR-CON M) CR tablet 80 mEq (80 mEq Oral Given 04/19/22 1126)  potassium chloride 10 mEq in 100 mL IVPB (10 mEq Intravenous New Bag/Given 04/19/22 1218)  magnesium sulfate IVPB 2 g 50 mL (2 g Intravenous  New Bag/Given 04/19/22 1236)    ED Course/ Medical Decision Making/ A&P                           Medical Decision Making Amount and/or Complexity of Data Reviewed Labs: ordered.  Risk Prescription drug management.   26 year old female presents to ED for evaluation.  Please see HPI for further details.  On examination, the patient is complaint free.  The patient reports that she did wake up with dizziness and a headache however upon arriving to the ED this morning her headache and dizziness resolved.  The patient is afebrile and nontachycardic.  The patient lung sounds are clear bilaterally, she is not hypoxic.  The patient abdomen is soft and compressible throughout.  The patient neurological examination shows no focal neurodeficits.  Patient nontoxic in appearance.  Patient work-up initiated in triage includes BMP, CBC, i-STAT beta-hCG, CT head, EKG.  CBC unremarkable, decreased hemoglobin however the patient has a baseline decrease hemoglobin.  Patient BMP is remarkable for a potassium of 2.4.  For this, the patient was treated with 10 mEq of IV potassium x3.  Patient was also given 80 mEq of oral potassium.  I advised the  patient that I wish to admit her because of her low potassium however the patient states that she does not wish to be admitted.  Patient reports that she has family obligations at home and admission will prevent her from feeling these allegations.  Advised the patient that I will replete her potassium here as best I can and then discharged home with oral potassium.  The patient is is agreeable to this plan.  The patient magnesium is unremarkable.  The patient CT head is unremarkable, there is no acute intracranial abnormality noted.  Patient EKG is nonischemic.  The patient was given 2 g mag sulfate, 10 mEq of IV potassium x3, 80 mEq of potassium orally.  Patient be discharged home on 40 mEq of potassium for 5 days.  The patient was advised to follow-up with her PCP in 1 to 2 days for potassium recheck.  The patient voiced understanding with my instructions.  The patient had all of her questions answered to her satisfaction prior to discharge.  The patient stable at this time.  Final Clinical Impression(s) / ED Diagnoses Final diagnoses:  Dizziness  Hypokalemia    Rx / DC Orders ED Discharge Orders          Ordered    potassium chloride SA (KLOR-CON M) 20 MEQ tablet  2 times daily        04/19/22 1535              Al Decant, [place] 04/19/22 1536    Mardene Sayer, MD 04/19/22 1941

## 2022-04-19 NOTE — Discharge Instructions (Addendum)
As we discussed earlier, your potassium is very low today.  We have advised that you be admitted for your low potassium however you are deciding to be discharged.  You are of sound mind and body to make your own medical decisions. Please begin taking 20 mEq of potassium orally 2 times daily for the next 5 days.  Please also begin taking electrolyte supplementation beverages to include Pedialyte, body armor, coconut water. Please read attached guide concerning hypokalemia. Please follow-up with your PCP tomorrow or the next day to have your labs rechecked. Please return to the ED with any new or worsening signs or symptoms.

## 2022-04-19 NOTE — ED Triage Notes (Signed)
Pt. Stated, Eileen Miller  been dizzy since this morning. It was like the room was spinning. I also have a headache.

## 2023-08-01 ENCOUNTER — Encounter (HOSPITAL_COMMUNITY): Payer: Self-pay | Admitting: Student

## 2023-08-01 ENCOUNTER — Other Ambulatory Visit: Payer: Self-pay

## 2023-08-01 ENCOUNTER — Observation Stay (HOSPITAL_COMMUNITY)
Admission: EM | Admit: 2023-08-01 | Discharge: 2023-08-03 | Disposition: A | Discharge status: Home / Self Care | Payer: Medicaid Other | Attending: Internal Medicine | Admitting: Internal Medicine

## 2023-08-01 DIAGNOSIS — I10 Essential (primary) hypertension: Secondary | ICD-10-CM | POA: Insufficient documentation

## 2023-08-01 DIAGNOSIS — R7989 Other specified abnormal findings of blood chemistry: Secondary | ICD-10-CM | POA: Diagnosis present

## 2023-08-01 DIAGNOSIS — E876 Hypokalemia: Principal | ICD-10-CM | POA: Insufficient documentation

## 2023-08-01 DIAGNOSIS — D500 Iron deficiency anemia secondary to blood loss (chronic): Secondary | ICD-10-CM | POA: Insufficient documentation

## 2023-08-01 DIAGNOSIS — Z79899 Other long term (current) drug therapy: Secondary | ICD-10-CM | POA: Diagnosis not present

## 2023-08-01 DIAGNOSIS — E559 Vitamin D deficiency, unspecified: Secondary | ICD-10-CM | POA: Insufficient documentation

## 2023-08-01 LAB — CBC
HCT: 29.8 % — ABNORMAL LOW (ref 36.0–46.0)
Hemoglobin: 8.3 g/dL — ABNORMAL LOW (ref 12.0–15.0)
MCH: 17.6 pg — ABNORMAL LOW (ref 26.0–34.0)
MCHC: 27.9 g/dL — ABNORMAL LOW (ref 30.0–36.0)
MCV: 63.3 fL — ABNORMAL LOW (ref 80.0–100.0)
Platelets: 432 10*3/uL — ABNORMAL HIGH (ref 150–400)
RBC: 4.71 MIL/uL (ref 3.87–5.11)
RDW: 20.8 % — ABNORMAL HIGH (ref 11.5–15.5)
WBC: 4 10*3/uL (ref 4.0–10.5)
nRBC: 0 % (ref 0.0–0.2)

## 2023-08-01 LAB — COMPREHENSIVE METABOLIC PANEL
ALT: 13 U/L (ref 0–44)
AST: 19 U/L (ref 15–41)
Albumin: 4 g/dL (ref 3.5–5.0)
Alkaline Phosphatase: 45 U/L (ref 38–126)
Anion gap: 11 (ref 5–15)
BUN: 10 mg/dL (ref 6–20)
CO2: 24 mmol/L (ref 22–32)
Calcium: 9.3 mg/dL (ref 8.9–10.3)
Chloride: 103 mmol/L (ref 98–111)
Creatinine, Ser: 0.77 mg/dL (ref 0.44–1.00)
GFR, Estimated: >60 mL/min (ref >60)
Glucose, Bld: 90 mg/dL (ref 70–99)
Potassium: 2.5 mmol/L — CL (ref 3.5–5.1)
Sodium: 138 mmol/L (ref 135–145)
Total Bilirubin: 0.7 mg/dL (ref <1.2)
Total Protein: 7.8 g/dL (ref 6.5–8.1)

## 2023-08-01 LAB — VITAMIN D 25 HYDROXY (VIT D DEFICIENCY, FRACTURES): Vit D, 25-Hydroxy: 11.87 ng/mL — ABNORMAL LOW (ref 30–100)

## 2023-08-01 LAB — VITAMIN B12: Vitamin B-12: 252 pg/mL (ref 180–914)

## 2023-08-01 LAB — TYPE AND SCREEN
ABO/RH(D): O POS
Antibody Screen: NEGATIVE

## 2023-08-01 LAB — HIV ANTIBODY (ROUTINE TESTING W REFLEX): HIV Screen 4th Generation wRfx: NONREACTIVE

## 2023-08-01 LAB — MAGNESIUM: Magnesium: 2 mg/dL (ref 1.7–2.4)

## 2023-08-01 LAB — TSH: TSH: 0.555 u[IU]/mL (ref 0.350–4.500)

## 2023-08-01 LAB — HCG, SERUM, QUALITATIVE: Preg, Serum: NEGATIVE

## 2023-08-01 MED ORDER — SODIUM CHLORIDE 0.9 % IV SOLN
125.0000 mg | Freq: Once | INTRAVENOUS | Status: AC
  Administered 2023-08-01: 125 mg via INTRAVENOUS
  Filled 2023-08-01: qty 10

## 2023-08-01 MED ORDER — POTASSIUM CHLORIDE CRYS ER 20 MEQ PO TBCR
40.0000 meq | EXTENDED_RELEASE_TABLET | Freq: Once | ORAL | Status: AC
  Administered 2023-08-01: 40 meq via ORAL
  Filled 2023-08-01: qty 2

## 2023-08-01 MED ORDER — ACETAMINOPHEN 325 MG PO TABS: 650.0000 mg | ORAL_TABLET | Freq: Four times a day (QID) | ORAL | Status: DC | PRN

## 2023-08-01 MED ORDER — POTASSIUM CHLORIDE 10 MEQ/100ML IV SOLN
10.0000 meq | INTRAVENOUS | Status: AC
  Administered 2023-08-01 (×3): 10 meq via INTRAVENOUS
  Filled 2023-08-01 (×3): qty 100

## 2023-08-01 MED ORDER — ONDANSETRON HCL 4 MG/2ML IJ SOLN: 4.0000 mg | Freq: Four times a day (QID) | INTRAMUSCULAR | Status: DC | PRN

## 2023-08-01 MED ORDER — LABETALOL HCL 5 MG/ML IV SOLN
20.0000 mg | INTRAVENOUS | Status: DC | PRN
  Administered 2023-08-02: 20 mg via INTRAVENOUS
  Filled 2023-08-01 (×2): qty 4

## 2023-08-01 MED ORDER — FERROUS SULFATE 325 (65 FE) MG PO TABS
325.0000 mg | ORAL_TABLET | Freq: Every day | ORAL | Status: DC
  Administered 2023-08-02 – 2023-08-03 (×2): 325 mg via ORAL
  Filled 2023-08-01 (×2): qty 1

## 2023-08-01 MED ORDER — POTASSIUM CHLORIDE 20 MEQ PO PACK
40.0000 meq | PACK | ORAL | Status: AC
  Administered 2023-08-01: 40 meq via ORAL
  Filled 2023-08-01: qty 2

## 2023-08-01 MED ORDER — ENOXAPARIN SODIUM 40 MG/0.4ML IJ SOSY
40.0000 mg | PREFILLED_SYRINGE | INTRAMUSCULAR | Status: DC
Start: 2023-08-01 — End: 2023-08-03
  Administered 2023-08-01 – 2023-08-02 (×2): 40 mg via SUBCUTANEOUS
  Filled 2023-08-01 (×2): qty 0.4

## 2023-08-01 MED ORDER — AMLODIPINE BESYLATE 10 MG PO TABS
10.0000 mg | ORAL_TABLET | Freq: Every day | ORAL | Status: DC
  Administered 2023-08-01 – 2023-08-03 (×3): 10 mg via ORAL
  Filled 2023-08-01 (×3): qty 1

## 2023-08-01 MED ORDER — ONDANSETRON HCL 4 MG PO TABS: 4.0000 mg | ORAL_TABLET | Freq: Four times a day (QID) | ORAL | Status: DC | PRN

## 2023-08-01 MED ORDER — POTASSIUM CHLORIDE 10 MEQ/100ML IV SOLN
10.0000 meq | INTRAVENOUS | Status: AC
  Administered 2023-08-01: 10 meq via INTRAVENOUS
  Filled 2023-08-01 (×2): qty 100

## 2023-08-01 MED ORDER — SENNOSIDES-DOCUSATE SODIUM 8.6-50 MG PO TABS: 1.0000 | ORAL_TABLET | Freq: Every evening | ORAL | Status: DC | PRN

## 2023-08-01 MED ORDER — ACETAMINOPHEN 650 MG RE SUPP: 650.0000 mg | Freq: Four times a day (QID) | RECTAL | Status: DC | PRN

## 2023-08-01 NOTE — ED Provider Notes (Cosign Needed)
[place] EMERGENCY DEPARTMENT AT [facility] Provider Note   CSN: [id number] Arrival date & time: 08/01/23  1159     History  Chief Complaint  Patient presents with   Abnormal Lab   Shortness of Breath    Eileen Miller is a 27 y.o. female with PMHx hypokalemia, HTN, IDA, who presents to ED concerned for abnormal labs. Patient stating that her PCP said that her hgb was low and referred her to ED. Patient does admit to vague intermittent fatigue/DOE over the past many weeks that she thought was normal for her given hx of IDA.  Denies fever, chest pain, cough, nausea, vomiting, diarrhea, abdominal pain, dysuria, hematuria, hematochezia, melena, blood thinners.    Abnormal Lab Shortness of Breath      Home Medications Prior to Admission medications   Medication Sig Start Date End Date Taking? Authorizing Provider  amLODipine (NORVASC) 2.5 MG tablet Take 2.5 mg by mouth daily. 07/16/23  Yes [provider]  ferrous sulfate (FERROUSUL) 325 (65 FE) MG tablet Take 1 tablet (325 mg total) by mouth 2 (two) times daily. 08/22/18  Yes Purohit, Salli Quarry, MD  ibuprofen (ADVIL) 800 MG tablet Take 1 tablet (800 mg total) by mouth every 8 (eight) hours as needed. 06/03/19  Yes Bovard-Stuckert, Augusto Gamble, MD  Pediatric Multiple Vit-C-FA (FLINSTONES GUMMIES OMEGA-3 DHA) CHEW Chew 1 tablet by mouth daily. 06/03/19  Yes Bovard-Stuckert, Jody, MD  meclizine (ANTIVERT) 25 MG tablet Take 1 tablet (25 mg total) by mouth 3 (three) times daily as needed for dizziness. Patient not taking: Reported on 08/01/2023 10/15/19   Maxwell Caul, PA-C      Allergies    Food    Review of Systems   Review of Systems  Respiratory:  Positive for shortness of breath.     Physical Exam Updated Vital Signs BP (!) 147/116   Pulse 84   Temp 98.6 F (37 C) (Oral)   Resp 17   Ht 5\' 7"  (1.702 m)   Wt 86.6 kg   SpO2 95%   BMI 29.91 kg/m  Physical Exam Vitals and nursing note reviewed.   Constitutional:      General: She is not in acute distress.    Appearance: She is not ill-appearing or toxic-appearing.  HENT:     Head: Normocephalic and atraumatic.     Mouth/Throat:     Mouth: Mucous membranes are moist.     Pharynx: No posterior oropharyngeal erythema.  Eyes:     General: No scleral icterus.       Right eye: No discharge.        Left eye: No discharge.     Conjunctiva/sclera: Conjunctivae normal.  Cardiovascular:     Rate and Rhythm: Normal rate and regular rhythm.     Pulses: Normal pulses.     Heart sounds: Normal heart sounds. No murmur heard. Pulmonary:     Effort: Pulmonary effort is normal. No respiratory distress.     Breath sounds: Normal breath sounds. No wheezing, rhonchi or rales.  Abdominal:     Tenderness: There is no abdominal tenderness.  Musculoskeletal:     Right lower leg: No edema.     Left lower leg: No edema.  Skin:    General: Skin is warm and dry.     Findings: No rash.  Neurological:     General: No focal deficit present.     Mental Status: She is alert. Mental status is at baseline.  Psychiatric:  Mood and Affect: Mood normal.        Behavior: Behavior normal.     ED Results / Procedures / Treatments   Labs (all labs ordered are listed, but only abnormal results are displayed) Labs Reviewed  COMPREHENSIVE METABOLIC PANEL - Abnormal; Notable for the following components:      Result Value   Potassium 2.5 (*)    All other components within normal limits  CBC - Abnormal; Notable for the following components:   Hemoglobin 8.3 (*)    HCT 29.8 (*)    MCV 63.3 (*)    MCH 17.6 (*)    MCHC 27.9 (*)    RDW 20.8 (*)    Platelets 432 (*)    All other components within normal limits  HCG, SERUM, QUALITATIVE  MAGNESIUM  HIV ANTIBODY (ROUTINE TESTING W REFLEX)  TYPE AND SCREEN    EKG None  Radiology No results found.  Procedures .Critical Care  Performed by: [name], PA-C Authorized by: [name], PA-C   Critical care provider statement:    Critical care time (minutes):  30   Critical care was necessary to treat or prevent imminent or life-threatening deterioration of the following conditions: critically low potassium.   Critical care was time spent personally by me on the following activities:  Development of treatment plan with patient or surrogate, discussions with consultants, evaluation of patient's response to treatment, examination of patient, ordering and review of laboratory studies, ordering and review of radiographic studies, ordering and performing treatments and interventions, pulse oximetry, re-evaluation of patient's condition and review of old charts   Care discussed with: admitting provider       Medications Ordered in ED Medications  enoxaparin (LOVENOX) injection 40 mg (has no administration in time range)  acetaminophen (TYLENOL) tablet 650 mg (has no administration in time range)    Or  acetaminophen (TYLENOL) suppository 650 mg (has no administration in time range)  senna-docusate (Senokot-S) tablet 1 tablet (has no administration in time range)  ondansetron (ZOFRAN) tablet 4 mg (has no administration in time range)    Or  ondansetron (ZOFRAN) injection 4 mg (has no administration in time range)  amLODipine (NORVASC) tablet 10 mg (has no administration in time range)  potassium chloride 10 mEq in 100 mL IVPB (has no administration in time range)  potassium chloride SA (KLOR-CON M) CR tablet 40 mEq (has no administration in time range)  potassium chloride 10 mEq in 100 mL IVPB (10 mEq Intravenous New Bag/Given 08/01/23 1615)  potassium chloride SA (KLOR-CON M) CR tablet 40 mEq (40 mEq Oral Given 08/01/23 1354)    ED Course/ Medical Decision Making/ A&P                                 Medical Decision Making Amount and/or Complexity of Data Reviewed Labs: ordered.  Risk Prescription drug management. Decision regarding  hospitalization.   This patient presents to the ED for concern of abnormal labs, this involves an extensive number of treatment options, and is a complaint that carries with it a high risk of complications and morbidity.  The differential diagnosis includes arrhythmias, anemia, electrolyte abnormalities   Co morbidities that complicate the patient evaluation  hypokalemia, HTN, IDA   Additional history obtained:  Additional history obtained from 04/2023 ED visit patient with hypokalemia at 2.4 but refused admission at this time.   Lab Tests:  I Ordered,  and personally interpreted labs.  The pertinent results include:   -CBC: anemia with hgb 8.3 -CMP: hypokalemia at 2.5 -hcg: negative -mag: 2.0   Cardiac Monitoring: / EKG:  The patient was maintained on a cardiac monitor.  I personally viewed and interpreted the cardiac monitored which showed an underlying rhythm of: no acute changes   Problem List / ED Course / Critical interventions / Medication management  Admitting patient for Hypokalemia. Patient presents to ED concerned for abnormal labs. Patient was under the impression that her hgb was 5. Patient admits to intermittent fatigue/DOE which is baseline for her and she believed to be d/t her anemia. Patient denies any other infectious complaint today. Patient afebrile with stable vitals.  CBC with anemia at 8.3. hcg negative. CMP with hypokalemia at 2.5. Mag within normal limits. Dr. Kirke Corin admitting provider. I have reviewed the patients home medicines and have made adjustments as needed   Social Determinants of Health:  none          Final Clinical Impression(s) / ED Diagnoses Final diagnoses:  Hypokalemia    Rx / DC Orders ED Discharge Orders     None         [name], [place] 08/01/23 1732

## 2023-08-01 NOTE — ED Triage Notes (Signed)
Pt referred to ED by PCP. Pt reports that she was having a checkup for her HTN approximately 2-3 weeks ago and had blood work drawn then. She has baseline anemia, but on re-evaluation by PCP today they noted her hgb to be at 5 at that time. Pt reports LMP 12/16. No hematochezia, melena per pt. No blood thinners per pt.

## 2023-08-01 NOTE — ED Notes (Signed)
ED TO INPATIENT HANDOFF REPORT  ED Nurse Name and Phone #:  Hansel Starling [phone]  S Name/Age/Gender Eileen Miller 27 y.o. female Room/Bed: 012C/012C  Code Status   Code Status: Full Code  Home/SNF/Other Home Patient oriented to: self, place, time, and situation Is this baseline? Yes   Triage Complete: Triage complete  Chief Complaint Hypokalemia [E87.6]  Triage Note Pt referred to ED by PCP. Pt reports that she was having a checkup for her HTN approximately 2-3 weeks ago and had blood work drawn then. She has baseline anemia, but on re-evaluation by PCP today they noted her hgb to be at 5 at that time. Pt reports LMP 12/16. No hematochezia, melena per pt. No blood thinners per pt.    Allergies Allergies  Allergen Reactions   Food Swelling and Other (See Comments)    Peanut butter causes tongue swelling     Level of Care/Admitting Diagnosis ED Disposition     ED Disposition  Admit   Condition  --   Comment  Hospital Area: [facility] [[id number]]  Level of Care: Telemetry Medical [104]  May place patient in observation at [facility] or [facility] if equivalent level of care is available:: No  Covid Evaluation: Asymptomatic - no recent exposure (last 10 days) testing not required  Diagnosis: Hypokalemia [172180]  Admitting Physician: Steffanie Rainwater [[id number]]  Attending Physician: Steffanie Rainwater [[id number]]          B Medical/Surgery History Past Medical History:  Diagnosis Date   History of bilateral tubal ligation 05/31/2019   History of cesarean section 03/03/2016   For severe Preeclampsia. Did not labor.    History of preterm delivery 03/03/2016   For severe Preeclampsia. Did not labor.    Hypertension    Iron deficiency anemia due to chronic blood loss 08/21/2018   Severe preeclampsia 2015   Severe preeclampsia 05/31/2019   Past Surgical History:  Procedure Laterality Date   CESAREAN SECTION  2015   CESAREAN SECTION N/A 10/20/2017    Procedure: CESAREAN SECTION;  Surgeon: Lazaro Arms, MD;  Location: [facility] BIRTHING SUITES;  Service: Obstetrics;  Laterality: N/A;   CESAREAN SECTION N/A 05/31/2019   Procedure: REPEAT CESAREAN SECTION WITH BILATERAL TUBAL LIGATION;  Surgeon: Edwinna Areola, DO;  Location: [facility] LD ORS;  Service: Obstetrics;  Laterality: N/A;   EYE SURGERY     HERNIA REPAIR       A IV Location/Drains/Wounds Patient Lines/Drains/Airways Status     Active Line/Drains/Airways     Name Placement date Placement time Site Days   Peripheral IV 08/01/23 22 G Posterior;Right Hand 08/01/23  1350  Hand  less than 1   Incision (Closed) 05/31/19 Abdomen Other (Comment) 05/31/19  1547  -- 1523   Incision (Closed) 05/31/19 Vagina Other (Comment) 05/31/19  1547  -- 1523            Intake/Output Last 24 hours  Intake/Output Summary (Last 24 hours) at 08/01/2023 1736 Last data filed at 08/01/2023 1452 Gross per 24 hour  Intake 100 ml  Output --  Net 100 ml    Labs/Imaging Results for orders placed or performed during the hospital encounter of 08/01/23 (from the past 48 hours)  Comprehensive metabolic panel     Status: Abnormal   Collection Time: 08/01/23 12:13 PM  Result Value Ref Range   Sodium 138 135 - 145 mmol/L   Potassium 2.5 (LL) 3.5 - 5.1 mmol/L    Comment: CRITICAL RESULT CALLED TO,  READ BACK BY AND VERIFIED WITH B.MONTEE,RN @1316  08/01/2023 VANG.J   Chloride 103 98 - 111 mmol/L   CO2 24 22 - 32 mmol/L   Glucose, Bld 90 70 - 99 mg/dL    Comment: Glucose reference range applies only to samples taken after fasting for at least 8 hours.   BUN 10 6 - 20 mg/dL   Creatinine, Ser 9.52 0.44 - 1.00 mg/dL   Calcium 9.3 8.9 - 84.1 mg/dL   Total Protein 7.8 6.5 - 8.1 g/dL   Albumin 4.0 3.5 - 5.0 g/dL   AST 19 15 - 41 U/L   ALT 13 0 - 44 U/L   Alkaline Phosphatase 45 38 - 126 U/L   Total Bilirubin 0.7 <1.2 mg/dL   GFR, Estimated >32 >44 mL/min    Comment: (NOTE) Calculated using the CKD-EPI  Creatinine Equation (2021)    Anion gap 11 5 - 15    Comment: Performed at [facility] Lab, [address] [place], [place] [address]  CBC     Status: Abnormal   Collection Time: 08/01/23 12:13 PM  Result Value Ref Range   WBC 4.0 4.0 - 10.5 K/uL   RBC 4.71 3.87 - 5.11 MIL/uL   Hemoglobin 8.3 (L) 12.0 - 15.0 g/dL    Comment: Reticulocyte Hemoglobin testing may be clinically indicated, consider ordering this additional test [id number]    HCT 29.8 (L) 36.0 - 46.0 %   MCV 63.3 (L) 80.0 - 100.0 fL   MCH 17.6 (L) 26.0 - 34.0 pg   MCHC 27.9 (L) 30.0 - 36.0 g/dL   RDW 34.7 (H) 42.5 - 95.6 %   Platelets 432 (H) 150 - 400 K/uL    Comment: REPEATED TO VERIFY   nRBC 0.0 0.0 - 0.2 %    Comment: Performed at [facility] Lab, [address], [place], [place] [address]  Type and screen [facility]     Status: None   Collection Time: 08/01/23 12:13 PM  Result Value Ref Range   ABO/RH(D) O POS    Antibody Screen NEG    Sample Expiration      08/04/2023,2359 Performed at [facility] Lab, [address] [place], [place] [address]   hCG, serum, qualitative     Status: None   Collection Time: 08/01/23 12:13 PM  Result Value Ref Range   Preg, Serum NEGATIVE NEGATIVE    Comment:        THE SENSITIVITY OF THIS METHODOLOGY IS >10 mIU/mL. Performed at [facility] Lab, [address], [place], [place] [address]   Magnesium     Status: None   Collection Time: 08/01/23 12:13 PM  Result Value Ref Range   Magnesium 2.0 1.7 - 2.4 mg/dL    Comment: Performed at [facility] Lab, [address] [place], [place] [address]   No results found.  Pending Labs Unresulted Labs (From admission, onward)     Start     Ordered   08/01/23 1719  HIV Antibody (routine testing w rflx)  (HIV Antibody (Routine testing w reflex) panel)  Once,   R        08/01/23 1720            Vitals/Pain Today's Vitals   08/01/23 1456 08/01/23 1500 08/01/23 1610 08/01/23 1645  BP: (!)  151/119 (!) 164/105  (!) 147/116  Pulse: 99 (!) 113  84  Resp: 16 19  17   Temp:   98.6 F (  37 C)   TempSrc:   Oral   SpO2:  95%  95%  Weight:      Height:      PainSc:        Isolation Precautions No active isolations  Medications Medications  enoxaparin (LOVENOX) injection 40 mg (has no administration in time range)  acetaminophen (TYLENOL) tablet 650 mg (has no administration in time range)    Or  acetaminophen (TYLENOL) suppository 650 mg (has no administration in time range)  senna-docusate (Senokot-S) tablet 1 tablet (has no administration in time range)  ondansetron (ZOFRAN) tablet 4 mg (has no administration in time range)    Or  ondansetron (ZOFRAN) injection 4 mg (has no administration in time range)  amLODipine (NORVASC) tablet 10 mg (has no administration in time range)  potassium chloride 10 mEq in 100 mL IVPB (has no administration in time range)  potassium chloride SA (KLOR-CON M) CR tablet 40 mEq (has no administration in time range)  potassium chloride 10 mEq in 100 mL IVPB (10 mEq Intravenous New Bag/Given 08/01/23 1615)  potassium chloride SA (KLOR-CON M) CR tablet 40 mEq (40 mEq Oral Given 08/01/23 1354)    Mobility walks     Focused Assessments Cardiac Assessment Handoff:  Cardiac Rhythm: Normal sinus rhythm No results found for: "CKTOTAL", "CKMB", "CKMBINDEX", "TROPONINI" Lab Results  Component Value Date   DDIMER 2.14 (H) 08/21/2018   Does the Patient currently have chest pain? No    R Recommendations: See Admitting Provider Note  Report given to:   Additional Notes:  Spouse at the bedside.

## 2023-08-01 NOTE — H&P (Signed)
History and Physical    Patient: Eileen Miller PXT:[id number] DOB: 1995-09-24 DOA: 08/01/2023 DOS: the patient was seen and examined on 08/01/2023 PCP: Lavina Hamman, MD  Patient coming from: Home  Chief Complaint:  Chief Complaint  Patient presents with   Abnormal Lab   Shortness of Breath   HPI: Eileen Miller is a 27 y.o. female with medical history significant of uncontrolled hypertension, iron deficiency anemia, microcytic anemia and hypokalemia who presented to the ED for evaluation of lab abnormalities and elevated BP. Patient states she has had high blood pressure since she was young as well as diagnosis of severe preeclampsia during her 3 deliveries. She had blood work 2 weeks ago and followed up today at her PCP office. She was advised to come to the ED due to persistently elevated BP and hypokalemia. Patient reports a history of chronic fatigue but denies any dizziness, headaches, chest pain, shortness of breath, bloody stools, hematuria, back pain, abdominal pain, fevers or chills.  She does have heavy menstrual bleeding with LMP on December 16.  ED course: Initial vitals were temp 98.2, RR 18, HR 75, BP 182/32, SpO2 100% on room air Labs show sodium 138, K+ 2.5, creatinine 0.77, WBC 4.0, Hgb 8.3, HCT 29.8, platelet 432, negative beta-hCG, mag 2.0 EKG shows sinus rhythm with HR 65 Patient was started on IV KCl 10 mEq x 3 hours and oral KCl 40 mEq x 1 TRH was consulted for admission   Review of Systems: As mentioned in the history of present illness. All other systems reviewed and are negative. Past Medical History:  Diagnosis Date   History of bilateral tubal ligation 05/31/2019   History of cesarean section 03/03/2016   For severe Preeclampsia. Did not labor.    History of preterm delivery 03/03/2016   For severe Preeclampsia. Did not labor.    Hypertension    Iron deficiency anemia due to chronic blood loss 08/21/2018   Severe preeclampsia 2015   Severe preeclampsia  05/31/2019   Past Surgical History:  Procedure Laterality Date   CESAREAN SECTION  2015   CESAREAN SECTION N/A 10/20/2017   Procedure: CESAREAN SECTION;  Surgeon: Lazaro Arms, MD;  Location: [facility] BIRTHING SUITES;  Service: Obstetrics;  Laterality: N/A;   CESAREAN SECTION N/A 05/31/2019   Procedure: REPEAT CESAREAN SECTION WITH BILATERAL TUBAL LIGATION;  Surgeon: Edwinna Areola, DO;  Location: [facility] LD ORS;  Service: Obstetrics;  Laterality: N/A;   EYE SURGERY     HERNIA REPAIR     Social History:  reports that she has never smoked. She has never used smokeless tobacco. She reports that she does not currently use alcohol. She reports that she does not use drugs.  Allergies  Allergen Reactions   Food Swelling and Other (See Comments)    Peanut butter causes tongue swelling     Family History  Problem Relation Age of Onset   Hypertension Father    Hypertension Maternal Grandmother    Hypertension Paternal Grandmother     Prior to Admission medications   Medication Sig Start Date End Date Taking? Authorizing Provider  amLODipine (NORVASC) 2.5 MG tablet Take 2.5 mg by mouth daily. 07/16/23  Yes [provider]  ferrous sulfate (FERROUSUL) 325 (65 FE) MG tablet Take 1 tablet (325 mg total) by mouth 2 (two) times daily. 08/22/18  Yes Purohit, Salli Quarry, MD  ibuprofen (ADVIL) 800 MG tablet Take 1 tablet (800 mg total) by mouth every 8 (eight) hours as needed. 06/03/19  Yes  Bovard-Stuckert, Augusto Gamble, MD  Pediatric Multiple Vit-C-FA (FLINSTONES GUMMIES OMEGA-3 DHA) CHEW Chew 1 tablet by mouth daily. 06/03/19  Yes Bovard-Stuckert, Jody, MD  meclizine (ANTIVERT) 25 MG tablet Take 1 tablet (25 mg total) by mouth 3 (three) times daily as needed for dizziness. Patient not taking: Reported on 08/01/2023 10/15/19   Maxwell Caul, PA-C    Physical Exam: Vitals:   08/01/23 1500 08/01/23 1610 08/01/23 1645 08/01/23 1730  BP: (!) 164/105  (!) 147/116 (!) 164/122  Pulse: (!) 113  84 91   Resp: 19  17 (!) 21  Temp:  98.6 F (37 C)    TempSrc:  Oral    SpO2: 95%  95% 100%  Weight:      Height:       General: Pleasant, well-appearing young woman laying in bed. No acute distress. HEENT: [place]/AT. Anicteric sclera. Pale conjunctiva. CV: RRR. No murmurs, rubs, or gallops. No LE edema Pulmonary: Lungs CTAB. Normal effort. No wheezing or rales. Abdominal: Soft, nontender, nondistended. Normal bowel sounds. Extremities: Palpable radial and DP pulses. Normal ROM. Skin: Warm and dry. No obvious rash or lesions. Neuro: A&Ox3. Moves all extremities. Normal sensation to light touch. No focal deficit. Psych: Normal mood and affect  Data Reviewed: Labs show sodium 138, K+ 2.5, creatinine 0.77, WBC 4.0, Hgb 8.3, HCT 29.8, platelet 432, negative beta-hCG, mag 2.0 EKG shows sinus rhythm with HR 65 and flattening of the T waves   Assessment and Plan: Eileen Miller is a 27 y.o. female with medical history significant of uncontrolled hypertension, iron deficiency anemia, microcytic anemia and hypokalemia who presented to the ED for evaluation of lab abnormalities and elevated BP elevated for severe hypokalemia.  # Hypokalemia Patient with history of hypokalemia dating back to 2017 with K+ as low as 2.4.  She reports chronic fatigue that is mostly attributed to her iron deficiency anemia.  K+ 2.5 on admission.  In the setting of her uncontrolled hypertension, this is most likely hyperaldosteronism. EKG showing flattening of the T waves. S/p 70 mEq of KCl in the ED. -Admit to telemetry bed for observation -Give additional 70 mEq through the IV and oral route -Follow-up morning BMP, mag and Phos  # Severe asymptomatic hypertension Patient with a history of uncontrolled hypertension and preeclampsia presented with severe hypertension with SBP in the 140s to 180s. No endorgan damage. Home regimen includes a low-dose of amlodipine 2.5 mg.  Hyperaldosteronism high on the differential in the  setting of her hypokalemia.  -Increase amlodipine from 2.5 mg to 10 mg daily -Check aldosterone, renin, ARR -Will initiate spironolactone therapy if labs are consistent with hyperaldosteronism  # Iron deficiency anemia # Microcytic anemia History of iron deficiency in the setting of heavy menstrual bleeding. This is being managed by her PCP with oral iron supplementation. Patient recently referred to heme-onc. She reports chronic fatigue secondary to this. Recent labs from her PCP on 12/10 shows Hgb 7.7, iron 20, iron sat 4 and ferritin 3 (see media tab).  Hgb improved to 8.3 on admission. No evidence of acute GI bleed. -Give IV Ferrlecit -Resume oral iron supplementation, changed to daily for better absorption -Trend CBC  # Chronic fatigue Secondary to chronic iron deficiency and hypokalemia.  -Check TSH, vitamin D, vitamin B12   Advance Care Planning:   Code Status: Full Code   Consults: None  Family Communication: No family at bedside  Severity of Illness: The appropriate patient status for this patient is OBSERVATION. Observation status is judged  to be reasonable and necessary in order to provide the required intensity of service to ensure the patient's safety. The patient's presenting symptoms, physical exam findings, and initial radiographic and laboratory data in the context of their medical condition is felt to place them at decreased risk for further clinical deterioration. Furthermore, it is anticipated that the patient will be medically stable for discharge from the hospital within 2 midnights of admission.   Author: Steffanie Rainwater, MD 08/01/2023 7:29 PM  For on call review [email or web address].

## 2023-08-01 NOTE — ED Provider Triage Note (Cosign Needed)
Emergency Medicine Provider Triage Evaluation Note  Eileen Miller , a 27 y.o. female  was evaluated in triage.  Pt complains of symptomatic anemia.  Review of Systems  Positive:  Negative:   Physical Exam  BP (!) 182/132 (BP Location: Right Arm)   Pulse 75   Temp 98.2 F (36.8 C) (Oral)   Resp 18   SpO2 100%  Gen:   Awake, no distress   Resp:  Normal effort  MSK:   Moves extremities without difficulty  Other:    Medical Decision Making  Medically screening exam initiated at 12:17 PM.  Appropriate orders placed.  Eileen Miller was informed that the remainder of the evaluation will be completed by another provider, this initial triage assessment does not replace that evaluation, and the importance of remaining in the ED until their evaluation is complete.  Concerned for symptomatic anemia. Hx of chronic anemia. Patient had a follow up appointment with PCP today and hgb was apparently 5.5. PCP told patient to come to ED. Patient NAD. Patient stating that she has been having SOB and fatigue recently.   Denies chest pain, cough, nausea, vomiting, diarrhea, hematuria, hematochezia. Denies melena.   [name], [place] 08/01/23 1223

## 2023-08-02 DIAGNOSIS — E876 Hypokalemia: Secondary | ICD-10-CM | POA: Diagnosis not present

## 2023-08-02 LAB — CBC
HCT: 27.9 % — ABNORMAL LOW (ref 36.0–46.0)
Hemoglobin: 7.8 g/dL — ABNORMAL LOW (ref 12.0–15.0)
MCH: 17.7 pg — ABNORMAL LOW (ref 26.0–34.0)
MCHC: 28 g/dL — ABNORMAL LOW (ref 30.0–36.0)
MCV: 63.3 fL — ABNORMAL LOW (ref 80.0–100.0)
Platelets: 484 10*3/uL — ABNORMAL HIGH (ref 150–400)
RBC: 4.41 MIL/uL (ref 3.87–5.11)
RDW: 20.5 % — ABNORMAL HIGH (ref 11.5–15.5)
WBC: 4 10*3/uL (ref 4.0–10.5)
nRBC: 0 % (ref 0.0–0.2)

## 2023-08-02 LAB — BASIC METABOLIC PANEL
Anion gap: 6 (ref 5–15)
BUN: 8 mg/dL (ref 6–20)
CO2: 24 mmol/L (ref 22–32)
Calcium: 8.9 mg/dL (ref 8.9–10.3)
Chloride: 107 mmol/L (ref 98–111)
Creatinine, Ser: 0.66 mg/dL (ref 0.44–1.00)
GFR, Estimated: >60 mL/min (ref >60)
Glucose, Bld: 105 mg/dL — ABNORMAL HIGH (ref 70–99)
Potassium: 3.3 mmol/L — ABNORMAL LOW (ref 3.5–5.1)
Sodium: 137 mmol/L (ref 135–145)

## 2023-08-02 LAB — PHOSPHORUS: Phosphorus: 2.3 mg/dL — ABNORMAL LOW (ref 2.5–4.6)

## 2023-08-02 LAB — MAGNESIUM: Magnesium: 1.9 mg/dL (ref 1.7–2.4)

## 2023-08-02 MED ORDER — MAGNESIUM SULFATE 2 GM/50ML IV SOLN
2.0000 g | Freq: Once | INTRAVENOUS | Status: AC
  Administered 2023-08-02: 2 g via INTRAVENOUS
  Filled 2023-08-02: qty 50

## 2023-08-02 MED ORDER — POTASSIUM PHOSPHATES 15 MMOLE/5ML IV SOLN
30.0000 mmol | Freq: Once | INTRAVENOUS | Status: DC
  Filled 2023-08-02: qty 10

## 2023-08-02 MED ORDER — VITAMIN D (ERGOCALCIFEROL) 1.25 MG (50000 UNIT) PO CAPS
50000.0000 [IU] | ORAL_CAPSULE | ORAL | Status: DC
  Administered 2023-08-02: 50000 [IU] via ORAL
  Filled 2023-08-02: qty 1

## 2023-08-02 MED ORDER — SPIRONOLACTONE 25 MG PO TABS
25.0000 mg | ORAL_TABLET | Freq: Every day | ORAL | Status: DC
  Administered 2023-08-03: 25 mg via ORAL
  Filled 2023-08-02: qty 1

## 2023-08-02 MED ORDER — POTASSIUM CHLORIDE CRYS ER 20 MEQ PO TBCR
40.0000 meq | EXTENDED_RELEASE_TABLET | Freq: Once | ORAL | Status: AC
  Administered 2023-08-02: 40 meq via ORAL
  Filled 2023-08-02: qty 2

## 2023-08-02 MED ORDER — SPIRONOLACTONE 12.5 MG HALF TABLET
12.5000 mg | ORAL_TABLET | Freq: Every day | ORAL | Status: DC
  Administered 2023-08-02: 12.5 mg via ORAL
  Filled 2023-08-02 (×2): qty 1

## 2023-08-02 MED ORDER — POTASSIUM PHOSPHATES 15 MMOLE/5ML IV SOLN
30.0000 mmol | Freq: Once | INTRAVENOUS | Status: AC
  Administered 2023-08-02: 30 mmol via INTRAVENOUS
  Filled 2023-08-02: qty 10

## 2023-08-02 MED ORDER — VITAMIN D 25 MCG (1000 UNIT) PO TABS
1000.0000 [IU] | ORAL_TABLET | Freq: Every day | ORAL | Status: DC
  Administered 2023-08-02 – 2023-08-03 (×2): 1000 [IU] via ORAL
  Filled 2023-08-02 (×2): qty 1

## 2023-08-02 NOTE — Progress Notes (Signed)
Progress Note    Eileen Miller  RUE:[id number] DOB: Dec 16, 1995  DOA: 08/01/2023 PCP: Lavina Hamman, MD    Brief Narrative:    Medical records reviewed and are as summarized below:  Eileen Miller is a 27 y.o. female with medical history significant of uncontrolled hypertension, iron deficiency anemia, microcytic anemia and hypokalemia who presented to the ED for evaluation of lab abnormalities and elevated BP elevated for severe hypokalemia.   Assessment/Plan:   Principal Problem:   Hypokalemia Active Problems:   Severe hypertension    Hypokalemia Patient with history of hypokalemia dating back to 2017 with K+ as low as 2.4.  She reports chronic fatigue that is mostly attributed to her iron deficiency anemia.  K+ 2.5 on admission.  In the setting of her uncontrolled hypertension, this is most likely hyperaldosteronism. EKG showing flattening of the T waves.  -replete -add spironolactone -await renin/aldosterone   Severe asymptomatic hypertension -norvasc and spironolactone   Iron deficiency anemia/ Microcytic anemia History of iron deficiency in the setting of heavy menstrual bleeding. This is being managed by her PCP with oral iron supplementation. Patient recently referred to heme-onc. She reports chronic fatigue secondary to this. Recent labs from her PCP on 12/10 shows Hgb 7.7, iron 20, iron sat 4 and ferritin 3 (see media tab).  Hgb improved to 8.3 on admission. No evidence of acute GI bleed. -Give IV Ferrlecit -hematology follow up   Vit D def -replete      Family Communication/Anticipated D/C date and plan/Code Status   DVT prophylaxis: Lovenox ordered. Code Status: Full Code.  Disposition Plan: Status is: Observation Home in AM?     Medical Consultants:   None.     Subjective:   No SOB, no CP  Objective:    Vitals:   08/01/23 2158 08/01/23 2345 08/02/23 0451 08/02/23 0729  BP: (!) 149/97 (!) 137/95 127/87 (!) 141/99  Pulse: 66 72 74  77  Resp: 18 20 20 18   Temp: 97.9 F (36.6 C) 98.5 F (36.9 C) 98.3 F (36.8 C) 98 F (36.7 C)  TempSrc: Oral Oral Oral   SpO2: 100% 100% 100% 100%  Weight:      Height:        Intake/Output Summary (Last 24 hours) at 08/02/2023 1248 Last data filed at 08/02/2023 1478 Gross per 24 hour  Intake 601.64 ml  Output --  Net 601.64 ml   Filed Weights   08/01/23 1356  Weight: 86.6 kg    Exam:  General: Appearance:     Overweight female in no acute distress     Lungs:    respirations unlabored  Heart:    Normal heart rate..   MS:   All extremities are intact.   Neurologic:   Awake, alert     Data Reviewed:   I have personally reviewed following labs and imaging studies:  Labs: Labs show the following:   Basic Metabolic Panel: Recent Labs  Lab 08/01/23 1213 08/02/23 0826  NA 138 137  K 2.5* 3.3*  CL 103 107  CO2 24 24  GLUCOSE 90 105*  BUN 10 8  CREATININE 0.77 0.66  CALCIUM 9.3 8.9  MG 2.0 1.9  PHOS  --  2.3*   GFR Estimated Creatinine Clearance: 117.3 mL/min (by C-G formula based on SCr of 0.66 mg/dL). Liver Function Tests: Recent Labs  Lab 08/01/23 1213  AST 19  ALT 13  ALKPHOS 45  BILITOT 0.7  PROT 7.8  ALBUMIN 4.0  No results for input(s): "LIPASE", "AMYLASE" in the last 168 hours. No results for input(s): "AMMONIA" in the last 168 hours. Coagulation profile No results for input(s): "INR", "PROTIME" in the last 168 hours.  CBC: Recent Labs  Lab 08/01/23 1213 08/02/23 0826  WBC 4.0 4.0  HGB 8.3* 7.8*  HCT 29.8* 27.9*  MCV 63.3* 63.3*  PLT 432* 484*   Cardiac Enzymes: No results for input(s): "CKTOTAL", "CKMB", "CKMBINDEX", "TROPONINI" in the last 168 hours. BNP (last 3 results) No results for input(s): "PROBNP" in the last 8760 hours. CBG: No results for input(s): "GLUCAP" in the last 168 hours. D-Dimer: No results for input(s): "DDIMER" in the last 72 hours. Hgb A1c: No results for input(s): "HGBA1C" in the last 72  hours. Lipid Profile: No results for input(s): "CHOL", "HDL", "LDLCALC", "TRIG", "CHOLHDL", "LDLDIRECT" in the last 72 hours. Thyroid function studies: Recent Labs    08/01/23 1930  TSH 0.555   Anemia work up: Recent Labs    08/01/23 1930  VITAMINB12 252   Sepsis Labs: Recent Labs  Lab 08/01/23 1213 08/02/23 0826  WBC 4.0 4.0    Microbiology No results found for this or any previous visit (from the past 240 hours).  Procedures and diagnostic studies:  No results found.  Medications:    amLODipine  10 mg Oral Daily   cholecalciferol  1,000 Units Oral Daily   enoxaparin (LOVENOX) injection  40 mg Subcutaneous Q24H   ferrous sulfate  325 mg Oral Q breakfast   spironolactone  12.5 mg Oral Daily   Vitamin D (Ergocalciferol)  50,000 Units Oral Q7 days   Continuous Infusions:  magnesium sulfate bolus IVPB     potassium PHOSPHATE IVPB (in mmol)       LOS: 0 days   Joseph Art  [facility]   How to contact the [facility] Attending or Consulting provider 7A - 7P or covering provider during after hours 7P -7A, for this patient?  Check the care team in [facility] and look for a) attending/consulting [facility] provider listed and b) the [facility] team listed Log into [email or web address] and use [facility]'s universal password to access. If you do not have the password, please contact the hospital operator. Locate the [facility] provider you are looking for under [facility] and page to a number that you can be directly reached. If you still have difficulty reaching the provider, please page the [facility] (Director on Call) for the Hospitalists listed on amion for assistance.  08/02/2023, 12:48 PM

## 2023-08-03 DIAGNOSIS — E876 Hypokalemia: Secondary | ICD-10-CM | POA: Diagnosis not present

## 2023-08-03 LAB — CBC
HCT: 27.6 % — ABNORMAL LOW (ref 36.0–46.0)
Hemoglobin: 7.5 g/dL — ABNORMAL LOW (ref 12.0–15.0)
MCH: 17.5 pg — ABNORMAL LOW (ref 26.0–34.0)
MCHC: 27.2 g/dL — ABNORMAL LOW (ref 30.0–36.0)
MCV: 64.3 fL — ABNORMAL LOW (ref 80.0–100.0)
Platelets: 461 10*3/uL — ABNORMAL HIGH (ref 150–400)
RBC: 4.29 MIL/uL (ref 3.87–5.11)
RDW: 20.9 % — ABNORMAL HIGH (ref 11.5–15.5)
WBC: 5.1 10*3/uL (ref 4.0–10.5)
nRBC: 0.4 % — ABNORMAL HIGH (ref 0.0–0.2)

## 2023-08-03 LAB — BASIC METABOLIC PANEL
Anion gap: 6 (ref 5–15)
BUN: 10 mg/dL (ref 6–20)
CO2: 24 mmol/L (ref 22–32)
Calcium: 9.2 mg/dL (ref 8.9–10.3)
Chloride: 110 mmol/L (ref 98–111)
Creatinine, Ser: 0.69 mg/dL (ref 0.44–1.00)
GFR, Estimated: >60 mL/min (ref >60)
Glucose, Bld: 101 mg/dL — ABNORMAL HIGH (ref 70–99)
Potassium: 3.5 mmol/L (ref 3.5–5.1)
Sodium: 140 mmol/L (ref 135–145)

## 2023-08-03 LAB — HEMOGLOBIN AND HEMATOCRIT, BLOOD
HCT: 27.3 % — ABNORMAL LOW (ref 36.0–46.0)
Hemoglobin: 7.7 g/dL — ABNORMAL LOW (ref 12.0–15.0)

## 2023-08-03 MED ORDER — FERROUS SULFATE 325 (65 FE) MG PO TABS: 325.0000 mg | ORAL_TABLET | Freq: Every day | ORAL | 3 refills | Status: AC

## 2023-08-03 MED ORDER — POTASSIUM CHLORIDE CRYS ER 20 MEQ PO TBCR
40.0000 meq | EXTENDED_RELEASE_TABLET | Freq: Once | ORAL | Status: AC
  Administered 2023-08-03: 40 meq via ORAL
  Filled 2023-08-03: qty 2

## 2023-08-03 MED ORDER — SPIRONOLACTONE 25 MG PO TABS: 25.0000 mg | ORAL_TABLET | Freq: Every day | ORAL | 1 refills | Status: AC

## 2023-08-03 MED ORDER — VITAMIN D (ERGOCALCIFEROL) 1.25 MG (50000 UNIT) PO CAPS: 50000.0000 [IU] | ORAL_CAPSULE | ORAL | 1 refills | Status: AC

## 2023-08-03 MED ORDER — AMLODIPINE BESYLATE 5 MG PO TABS: 5.0000 mg | ORAL_TABLET | Freq: Every day | ORAL | 1 refills | Status: AC

## 2023-08-03 NOTE — Plan of Care (Signed)

## 2023-08-03 NOTE — Plan of Care (Signed)

## 2023-08-03 NOTE — Plan of Care (Signed)
  Problem: Education: Goal: Knowledge of General Education information will improve Description: Including pain rating scale, medication(s)/side effects and non-pharmacologic comfort measures 08/03/2023 0619 by Vaunda Gutterman, RN Outcome: Progressing 08/03/2023 0540 by Lilliam Chamblee, RN Outcome: Progressing   Problem: Health Behavior/Discharge Planning: Goal: Ability to manage health-related needs will improve 08/03/2023 0619 by Jamontae Thwaites, RN Outcome: Progressing 08/03/2023 0540 by Baby Stairs, RN Outcome: Progressing   Problem: Clinical Measurements: Goal: Ability to maintain clinical measurements within normal limits will improve 08/03/2023 0619 by Zian Delair, RN Outcome: Progressing 08/03/2023 0540 by Anvi Mangal, RN Outcome: Progressing Goal: Will remain free from infection 08/03/2023 0619 by Maysun Meditz, RN Outcome: Progressing 08/03/2023 0540 by Mylan Schwarz, RN Outcome: Progressing Goal: Diagnostic test results will improve 08/03/2023 0619 by Alara Daniel, RN Outcome: Progressing 08/03/2023 0540 by Anders Hohmann, RN Outcome: Progressing Goal: Respiratory complications will improve 08/03/2023 0619 by Aeneas Longsworth, RN Outcome: Progressing 08/03/2023 0540 by Isa Kohlenberg, RN Outcome: Progressing Goal: Cardiovascular complication will be avoided 08/03/2023 0619 by Shaylee Stanislawski, RN Outcome: Progressing 08/03/2023 0540 by Ryenn Howeth, RN Outcome: Progressing   Problem: Activity: Goal: Risk for activity intolerance will decrease 08/03/2023 0619 by Chrishaun Sasso, RN Outcome: Progressing 08/03/2023 0540 by Kamrin Sibley, RN Outcome: Progressing   Problem: Nutrition: Goal: Adequate nutrition will be maintained 08/03/2023 0619 by Kyonna Frier, RN Outcome: Progressing 08/03/2023 0540 by Munachimso Rigdon, RN Outcome: Progressing   Problem: Coping: Goal: Level of anxiety will decrease 08/03/2023 0619 by Camara Rosander, RN Outcome: Progressing 08/03/2023 0540 by Ladajah Soltys, RN Outcome: Progressing    Problem: Elimination: Goal: Will not experience complications related to bowel motility 08/03/2023 0619 by Shayan Bramhall, RN Outcome: Progressing 08/03/2023 0540 by Robbie Rideaux, RN Outcome: Progressing Goal: Will not experience complications related to urinary retention 08/03/2023 0619 by Shardae Kleinman, RN Outcome: Progressing 08/03/2023 0540 by Adekunle Rohrbach, RN Outcome: Progressing   Problem: Pain Management: Goal: General experience of comfort will improve 08/03/2023 0619 by Michelle Vanhise, RN Outcome: Progressing 08/03/2023 0540 by Kavir Savoca, RN Outcome: Progressing   Problem: Safety: Goal: Ability to remain free from injury will improve 08/03/2023 0619 by Taeja Debellis, RN Outcome: Progressing 08/03/2023 0540 by Takiya Belmares, RN Outcome: Progressing   Problem: Skin Integrity: Goal: Risk for impaired skin integrity will decrease 08/03/2023 0619 by Reilley Valentine, RN Outcome: Progressing 08/03/2023 0540 by Scarlett Portlock, RN Outcome: Progressing

## 2023-08-03 NOTE — Discharge Summary (Signed)
Physician Discharge Summary  Eileen Miller UXL:[id number] DOB: 1996/05/04 DOA: 08/01/2023  PCP: Lavina Hamman, MD  Admit date: 08/01/2023 Discharge date: 08/03/2023  Admitted From: home Discharge disposition: home   Recommendations for Outpatient Follow-Up:   Cbc, bmp 1 week Follow vit D level Pending: renin-aldosterone levels Continue to adjust BP meds   Discharge Diagnosis:   Principal Problem:   Hypokalemia Active Problems:   Severe hypertension    Discharge Condition: Improved.  Diet recommendation:   Regular.  Wound care: None.  Code status: Full.   History of Present Illness:   Eileen Miller is a 27 y.o. female with medical history significant of uncontrolled hypertension, iron deficiency anemia, microcytic anemia and hypokalemia who presented to the ED for evaluation of lab abnormalities and elevated BP. Patient states she has had high blood pressure since she was young as well as diagnosis of severe preeclampsia during her 3 deliveries. She had blood work 2 weeks ago and followed up today at her PCP office. She was advised to come to the ED due to persistently elevated BP and hypokalemia. Patient reports a history of chronic fatigue but denies any dizziness, headaches, chest pain, shortness of breath, bloody stools, hematuria, back pain, abdominal pain, fevers or chills.  She does have heavy menstrual bleeding with LMP on December 16.    Hospital Course by Problem:   Hypokalemia Patient with history of hypokalemia dating back to 2017 with K+ as low as 2.4.  She reports chronic fatigue that is mostly attributed to her iron deficiency anemia.  K+ 2.5 on admission.  In the setting of her uncontrolled hypertension, this is most likely hyperaldosteronism. EKG showing flattening of the T waves.  -repleted -added spironolactone with improvement -await renin/aldosterone   Severe asymptomatic hypertension -norvasc and spironolactone    Iron deficiency  anemia/ Microcytic anemia History of iron deficiency in the setting of heavy menstrual bleeding. This is being managed by her PCP with oral iron supplementation. Patient recently referred to heme-onc. She reports chronic fatigue secondary to this. Recent labs from her PCP on 12/10 shows Hgb 7.7, iron 20, iron sat 4 and ferritin 3 (see media tab).  Hgb improved to 8.3 on admission. No evidence of acute GI bleed. -Given IV Ferrlecit -had hematology follow up   Vit D def -repleted    Medical Consultants:      Discharge Exam:   Vitals:   08/03/23 0617 08/03/23 0822  BP: (!) 136/99 (!) 138/98  Pulse: 70 73  Resp: 18 18  Temp: 97.8 F (36.6 C) 98.3 F (36.8 C)  SpO2: 100% 100%   Vitals:   08/02/23 1931 08/03/23 0144 08/03/23 0617 08/03/23 0822  BP: (!) 136/94 (!) 130/94 (!) 136/99 (!) 138/98  Pulse: 77 65 70 73  Resp: 18 14 18 18   Temp: 98.3 F (36.8 C) 98.2 F (36.8 C) 97.8 F (36.6 C) 98.3 F (36.8 C)  TempSrc: Oral Oral Oral Oral  SpO2: 99% 100% 100% 100%  Weight:      Height:        General exam: Appears calm and comfortable.    The results of significant diagnostics from this hospitalization (including imaging, microbiology, ancillary and laboratory) are listed below for reference.     Procedures and Diagnostic Studies:   No results found.   Labs:   Basic Metabolic Panel: Recent Labs  Lab 08/01/23 1213 08/02/23 0826 08/03/23 0518  NA 138 137 140  K 2.5* 3.3* 3.5  CL 103  107 110  CO2 24 24 24   GLUCOSE 90 105* 101*  BUN 10 8 10   CREATININE 0.77 0.66 0.69  CALCIUM 9.3 8.9 9.2  MG 2.0 1.9  --   PHOS  --  2.3*  --    GFR Estimated Creatinine Clearance: 117.3 mL/min (by C-G formula based on SCr of 0.69 mg/dL). Liver Function Tests: Recent Labs  Lab 08/01/23 1213  AST 19  ALT 13  ALKPHOS 45  BILITOT 0.7  PROT 7.8  ALBUMIN 4.0   No results for input(s): "LIPASE", "AMYLASE" in the last 168 hours. No results for input(s): "AMMONIA" in the  last 168 hours. Coagulation profile No results for input(s): "INR", "PROTIME" in the last 168 hours.  CBC: Recent Labs  Lab 08/01/23 1213 08/02/23 0826 08/03/23 0518 08/03/23 1026  WBC 4.0 4.0 5.1  --   HGB 8.3* 7.8* 7.5* 7.7*  HCT 29.8* 27.9* 27.6* 27.3*  MCV 63.3* 63.3* 64.3*  --   PLT 432* 484* 461*  --    Cardiac Enzymes: No results for input(s): "CKTOTAL", "CKMB", "CKMBINDEX", "TROPONINI" in the last 168 hours. BNP: Invalid input(s): "POCBNP" CBG: No results for input(s): "GLUCAP" in the last 168 hours. D-Dimer No results for input(s): "DDIMER" in the last 72 hours. Hgb A1c No results for input(s): "HGBA1C" in the last 72 hours. Lipid Profile No results for input(s): "CHOL", "HDL", "LDLCALC", "TRIG", "CHOLHDL", "LDLDIRECT" in the last 72 hours. Thyroid function studies Recent Labs    08/01/23 1930  TSH 0.555   Anemia work up Recent Labs    08/01/23 1930  VITAMINB12 252   Microbiology No results found for this or any previous visit (from the past 240 hours).   Discharge Instructions:   Discharge Instructions     Diet general   Complete by: As directed    Discharge instructions   Complete by: As directed    Cbc, bmp at next week's doctors appointment Renin-aldosterone pending   Increase activity slowly   Complete by: As directed       Allergies as of 08/03/2023       Reactions   Food Swelling, Other (See Comments)   Peanut butter causes tongue swelling         Medication List     STOP taking these medications    meclizine 25 MG tablet Commonly known as: ANTIVERT       TAKE these medications    amLODipine 5 MG tablet Commonly known as: NORVASC Take 1 tablet (5 mg total) by mouth daily. Start taking on: August 04, 2023 What changed:  medication strength how much to take   ferrous sulfate 325 (65 FE) MG tablet Commonly known as: FerrouSul Take 1 tablet (325 mg total) by mouth daily with breakfast. Start taking on: August 04, 2023 What changed: when to take this   Flinstones Gummies Omega-3 DHA Chew Chew 1 tablet by mouth daily.   ibuprofen 800 MG tablet Commonly known as: ADVIL Take 1 tablet (800 mg total) by mouth every 8 (eight) hours as needed.   spironolactone 25 MG tablet Commonly known as: ALDACTONE Take 1 tablet (25 mg total) by mouth daily. Start taking on: August 04, 2023   Vitamin D (Ergocalciferol) 1.25 MG (50000 UNIT) Caps capsule Commonly known as: DRISDOL Take 1 capsule (50,000 Units total) by mouth every 7 (seven) days. Start taking on: August 09, 2023          Time coordinating discharge: 45 min  Signed:  Selinda Orion  Mordecai Tindol DO  [facility] 08/03/2023, 12:08 PM

## 2023-08-08 ENCOUNTER — Other Ambulatory Visit: Payer: Self-pay | Admitting: Oncology

## 2023-08-08 ENCOUNTER — Inpatient Hospital Stay: Payer: Medicaid Other

## 2023-08-08 ENCOUNTER — Encounter: Payer: Self-pay | Admitting: Oncology

## 2023-08-08 ENCOUNTER — Inpatient Hospital Stay: Payer: Medicaid Other | Attending: Oncology | Admitting: Oncology

## 2023-08-08 VITALS — BP 163/107 | HR 65 | Temp 97.5°F | Resp 17 | Wt 193.6 lb

## 2023-08-08 DIAGNOSIS — D500 Iron deficiency anemia secondary to blood loss (chronic): Secondary | ICD-10-CM | POA: Diagnosis not present

## 2023-08-08 DIAGNOSIS — Z79899 Other long term (current) drug therapy: Secondary | ICD-10-CM | POA: Diagnosis not present

## 2023-08-08 DIAGNOSIS — N920 Excessive and frequent menstruation with regular cycle: Secondary | ICD-10-CM | POA: Diagnosis not present

## 2023-08-08 DIAGNOSIS — I10 Essential (primary) hypertension: Secondary | ICD-10-CM | POA: Diagnosis not present

## 2023-08-08 LAB — CBC WITH DIFFERENTIAL/PLATELET
Abs Immature Granulocytes: 0.01 10*3/uL (ref 0.00–0.07)
Basophils Absolute: 0 10*3/uL (ref 0.0–0.1)
Basophils Relative: 1 %
Eosinophils Absolute: 0.1 10*3/uL (ref 0.0–0.5)
Eosinophils Relative: 3 %
HCT: 29.3 % — ABNORMAL LOW (ref 36.0–46.0)
Hemoglobin: 8.6 g/dL — ABNORMAL LOW (ref 12.0–15.0)
Immature Granulocytes: 0 %
Lymphocytes Relative: 42 %
Lymphs Abs: 1.5 10*3/uL (ref 0.7–4.0)
MCH: 18.8 pg — ABNORMAL LOW (ref 26.0–34.0)
MCHC: 29.4 g/dL — ABNORMAL LOW (ref 30.0–36.0)
MCV: 64 fL — ABNORMAL LOW (ref 80.0–100.0)
Monocytes Absolute: 0.2 10*3/uL (ref 0.1–1.0)
Monocytes Relative: 7 %
Neutro Abs: 1.7 10*3/uL (ref 1.7–7.7)
Neutrophils Relative %: 47 %
Platelets: 460 10*3/uL — ABNORMAL HIGH (ref 150–400)
RBC: 4.58 MIL/uL (ref 3.87–5.11)
RDW: 22.9 % — ABNORMAL HIGH (ref 11.5–15.5)
Smear Review: NORMAL
WBC: 3.6 10*3/uL — ABNORMAL LOW (ref 4.0–10.5)
nRBC: 0 % (ref 0.0–0.2)

## 2023-08-08 LAB — COMPREHENSIVE METABOLIC PANEL
ALT: 10 U/L (ref 0–44)
AST: 13 U/L — ABNORMAL LOW (ref 15–41)
Albumin: 4.5 g/dL (ref 3.5–5.0)
Alkaline Phosphatase: 49 U/L (ref 38–126)
Anion gap: 4 — ABNORMAL LOW (ref 5–15)
BUN: 13 mg/dL (ref 6–20)
CO2: 28 mmol/L (ref 22–32)
Calcium: 9.6 mg/dL (ref 8.9–10.3)
Chloride: 107 mmol/L (ref 98–111)
Creatinine, Ser: 0.74 mg/dL (ref 0.44–1.00)
GFR, Estimated: >60 mL/min (ref >60)
Glucose, Bld: 92 mg/dL (ref 70–99)
Potassium: 3.8 mmol/L (ref 3.5–5.1)
Sodium: 139 mmol/L (ref 135–145)
Total Bilirubin: 0.4 mg/dL (ref 0.0–1.2)
Total Protein: 8.2 g/dL — ABNORMAL HIGH (ref 6.5–8.1)

## 2023-08-08 LAB — IRON AND IRON BINDING CAPACITY (CC-WL,HP ONLY)
Iron: 29 ug/dL (ref 28–170)
Saturation Ratios: 6 % — ABNORMAL LOW (ref 10.4–31.8)
TIBC: 511 ug/dL — ABNORMAL HIGH (ref 250–450)
UIBC: 482 ug/dL — ABNORMAL HIGH (ref 148–442)

## 2023-08-08 LAB — LACTATE DEHYDROGENASE: LDH: 156 U/L (ref 98–192)

## 2023-08-08 LAB — FERRITIN: Ferritin: 16 ng/mL (ref 11–307)

## 2023-08-08 NOTE — Assessment & Plan Note (Signed)
 Mentioned picking up medication today, suggesting possible recent lapse in medication adherence. -Advise patient to maintain consistent use of antihypertensive medication.

## 2023-08-08 NOTE — Assessment & Plan Note (Addendum)
 Longstanding history of anemia since 2015, likely secondary to heavy menstrual bleeding and gum bleeding. Currently on oral iron  supplements with no reported side effects. Recent labs from 07/20/2023 showed hemoglobin of 7.7 and low iron  stores, indicating ongoing iron  deficiency despite oral supplementation.  -Labs today showed hemoglobin of 8.6, MCV 64.  White count 3600, platelet count 460,000.  Iron  studies continue to show evidence of iron  deficiency with decreased iron  saturation of 6%, increased in medical history.  -Will proceed with IV iron  using Feraheme x 2 doses, 1 week apart, to be given at our infusion center on [address].  -Continue oral iron  supplementation. Advise patient to take iron  supplement with vitamin C to enhance absorption and to increase intake of iron -rich foods.  -Follow-up in approximately three months to reassess anemia status.

## 2023-08-08 NOTE — Progress Notes (Signed)
 [facility]  HEMATOLOGY CLINIC CONSULTATION NOTE   PATIENT NAME: Eileen Miller   MR#: [id number] DOB: 1995-09-22  DATE OF SERVICE: 08/08/2023  Patient Care Team: Horacio Boas, MD as PCP - General (Obstetrics and Gynecology)  REASON FOR CONSULTATION/ CHIEF COMPLAINT:  Evaluation of anemia.  ASSESSMENT & PLAN:   Korbyn Miller is a 28 y.o. lady with a past medical history of essential hypertension, iron  deficiency anemia, was referred to our service for evaluation of iron  deficiency anemia.    Iron  deficiency anemia due to chronic blood loss Longstanding history of anemia since 2015, likely secondary to heavy menstrual bleeding and gum bleeding. Currently on oral iron  supplements with no reported side effects. Recent labs from 07/20/2023 showed hemoglobin of 7.7 and low iron  stores, indicating ongoing iron  deficiency despite oral supplementation.  -Labs today showed hemoglobin of 8.6, MCV 64.  White count 3600, platelet count 460,000.  Iron  studies continue to show evidence of iron  deficiency with decreased iron  saturation of 6%, increased in medical history.  -Will proceed with IV iron  using Feraheme x 2 doses, 1 week apart, to be given at our infusion center on [address].  -Continue oral iron  supplementation. Advise patient to take iron  supplement with vitamin C to enhance absorption and to increase intake of iron -rich foods.  -Follow-up in approximately three months to reassess anemia status.  Severe hypertension Mentioned picking up medication today, suggesting possible recent lapse in medication adherence. -Advise patient to maintain consistent use of antihypertensive medication.   Since the cause of anemia seems to be obvious from iron  deficiency, I am not pursuing extensive workup at this time.  If inadequate response to IV iron  is noted, we will pursue workup to rule out other etiologies.  I reviewed lab results and outside records for this visit  and discussed relevant results with the patient. Diagnosis, plan of care and treatment options were also discussed in detail with the patient. Opportunity provided to ask questions and answers provided to her apparent satisfaction. Provided instructions to call our clinic with any problems, questions or concerns prior to return visit. I recommended to continue follow-up with PCP and sub-specialists. She verbalized understanding and agreed with the plan. No barriers to learning was detected.  Eileen Andis, MD  CANC[facility] CENTER [facility] - A DEPT OF [facility] [address] [place] [address] Dept: [phone] Dept Fax: [phone]  08/08/2023 5:12 PM  HISTORY OF PRESENT ILLNESS:  Discussed the use of AI scribe software for clinical note transcription with the patient, who gave verbal consent to proceed.   On 07/20/2023, labs at her PCPs office showed hemoglobin of 7.7, hematocrit 28.4, MCV 64.  White count 4300 with normal differential.  Platelet count 410,000.  Iron  studies showed ferritin of 3, iron  saturation decreased at 4%, iron  decreased at 20.  She was referred to us  for further evaluation and management of iron  deficiency anemia.  Her anemia became more problematic after her first pregnancy in 2015. She has been on iron  supplements, which she tolerates well, and is due to pick up a new supply. She experiences heavy menstrual cycles with significant bleeding and clotting in the first two days. She also reports gum bleeding when brushing her teeth, but no other obvious bleeding or blood loss. She has had IV iron  infusions in the past during a hospital stay. Despite taking oral iron  supplements, her iron  levels remain low, suggesting that her anemia  may be due to blood loss from her menstrual cycles and gum bleeding.  MEDICAL HISTORY:  Past Medical History:  Diagnosis Date   History of bilateral tubal ligation 05/31/2019   History of  cesarean section 03/03/2016   For severe Preeclampsia. Did not labor.    History of preterm delivery 03/03/2016   For severe Preeclampsia. Did not labor.    Hypertension    Iron  deficiency anemia due to chronic blood loss 08/21/2018   Severe preeclampsia 2015   Severe preeclampsia 05/31/2019    SURGICAL HISTORY: Past Surgical History:  Procedure Laterality Date   CESAREAN SECTION  2015   CESAREAN SECTION N/A 10/20/2017   Procedure: CESAREAN SECTION;  Surgeon: Jayne Vonn DEL, MD;  Location: [facility] BIRTHING SUIT[facility];  Service: Obstetrics;  Laterality: N/A;   CESAREAN SECTION N/A 05/31/2019   Procedure: REPEAT CESAREAN SECTION WITH BILATERAL TUBAL LIGATION;  Surgeon: Delana Ted Morrison, DO;  Location: [facility];  Service: Obstetrics;  Laterality: N/A;   EYE SURGERY     HERNIA REPAIR      SOCIAL HISTORY: She reports that she has never smoked. She has never used smokeless tobacco. She reports that she does not currently use alcohol. She reports that she does not use drugs. Social History   Socioeconomic History   Marital status: Significant Other    Spouse name: Not on file   Number of children: Not on file   Years of education: Not on file   Highest education level: Not on file  Occupational History    Comment: retail store - [employer]  Tobacco Use   Smoking status: Never   Smokeless tobacco: Never  Vaping Use   Vaping status: Never Used  Substance and Sexual Activity   Alcohol use: Not Currently    Comment: not while pregnant   Drug use: No   Sexual activity: Yes    Birth control/protection: None  Other Topics Concern   Not on file  Social History Narrative   Not on file   Social Drivers of Health   Financial Resource Strain: Low Risk  (08/21/2018)   Overall Financial Resource Strain (CARDIA)    Difficulty of Paying Living Expenses: Not hard at all  Food Insecurity: No Food Insecurity (08/01/2023)   Hunger Vital Sign    Worried About Running Out of Food in the  Last Year: Never true    Ran Out of Food in the Last Year: Never true  Transportation Needs: No Transportation Needs (08/01/2023)   PRAPARE - Administrator, Civil Service (Medical): No    Lack of Transportation (Non-Medical): No  Physical Activity: Sufficiently Active (08/21/2018)   Exercise Vital Sign    Days of Exercise per Week: 7 days    Minutes of Exercise per Session: 30 min  Stress: No Stress Concern Present (08/21/2018)   [employer] of Occupational Health - Occupational Stress Questionnaire    Feeling of Stress : Not at all  Social Connections: Unknown (12/17/2021)   Received from [facility]   Social Network    Social Network: Not on file  Intimate Partner Violence: Not At Risk (08/01/2023)   Humiliation, Afraid, Rape, and Kick questionnaire    Fear of Current or Ex-Partner: No    Emotionally Abused: No    Physically Abused: No    Sexually Abused: No    FAMILY HISTORY: Family History  Problem Relation Age of Onset   Hypertension Father    Hypertension Maternal Grandmother  Hypertension Paternal Grandmother     ALLERGIES:  She is allergic to food.  MEDICATIONS:  Current Outpatient Medications  Medication Sig Dispense Refill   amLODipine  (NORVASC ) 5 MG tablet Take 1 tablet (5 mg total) by mouth daily. 30 tablet 1   ferrous sulfate  (FERROUSUL) 325 (65 FE) MG tablet Take 1 tablet (325 mg total) by mouth daily with breakfast. 30 tablet 3   ibuprofen  (ADVIL ) 800 MG tablet Take 1 tablet (800 mg total) by mouth every 8 (eight) hours as needed. 45 tablet 1   Pediatric Multiple Vit-C-FA (FLINSTONES GUMMIES OMEGA-3 DHA) CHEW Chew 1 tablet by mouth daily. 100 tablet 3   spironolactone  (ALDACTONE ) 25 MG tablet Take 1 tablet (25 mg total) by mouth daily. 30 tablet 1   [START ON 08/09/2023] Vitamin D , Ergocalciferol , (DRISDOL ) 1.25 MG (50000 UNIT) CAPS capsule Take 1 capsule (50,000 Units total) by mouth every 7 (seven) days. (Patient not taking:  Reported on 08/08/2023) 4 capsule 1   No current facility-administered medications for this visit.    REVIEW OF SYSTEMS:    Review of Systems - Oncology  All other pertinent systems were reviewed and were negative except as mentioned above.  PHYSICAL EXAMINATION:  ECOG PERFORMANCE STATUS: 1 - Symptomatic but completely ambulatory  Vitals:   08/08/23 1017  BP: (!) 163/107  Pulse: 65  Resp: 17  Temp: (!) 97.5 F (36.4 C)  SpO2: 100%   Filed Weights   08/08/23 1017  Weight: 193 lb 9.6 oz (87.8 kg)    Physical Exam Constitutional:      General: She is not in acute distress.    Appearance: Normal appearance.  HENT:     Head: Normocephalic and atraumatic.  Eyes:     General: No scleral icterus.    Conjunctiva/sclera: Conjunctivae normal.  Cardiovascular:     Rate and Rhythm: Normal rate and regular rhythm.     Heart sounds: Normal heart sounds.  Pulmonary:     Effort: Pulmonary effort is normal.     Breath sounds: Normal breath sounds.  Abdominal:     General: There is no distension.  Musculoskeletal:     Right lower leg: No edema.     Left lower leg: No edema.  Neurological:     General: No focal deficit present.     Mental Status: She is alert and oriented to person, place, and time.  Psychiatric:        Mood and Affect: Mood normal.        Behavior: Behavior normal.        Thought Content: Thought content normal.      LABORATORY DATA:   I have reviewed the data as listed.  Results for orders placed or performed in visit on 08/08/23  Lactate dehydrogenase  Result Value Ref Range   LDH 156 98 - 192 U/L  Ferritin  Result Value Ref Range   Ferritin 16 11 - 307 ng/mL  Iron  and Iron  Binding Capacity ([facility] only)  Result Value Ref Range   Iron  29 28 - 170 ug/dL   TIBC 488 (H) 749 - 549 ug/dL   Saturation Ratios 6 (L) 10.4 - 31.8 %   UIBC 482 (H) 148 - 442 ug/dL  Comprehensive metabolic panel  Result Value Ref Range   Sodium 139 135 - 145 mmol/L    Potassium 3.8 3.5 - 5.1 mmol/L   Chloride 107 98 - 111 mmol/L   CO2 28 22 - 32 mmol/L   Glucose, Bld 92  70 - 99 mg/dL   BUN 13 6 - 20 mg/dL   Creatinine, Ser 9.25 0.44 - 1.00 mg/dL   Calcium  9.6 8.9 - 10.3 mg/dL   Total Protein 8.2 (H) 6.5 - 8.1 g/dL   Albumin 4.5 3.5 - 5.0 g/dL   AST 13 (L) 15 - 41 U/L   ALT 10 0 - 44 U/L   Alkaline Phosphatase 49 38 - 126 U/L   Total Bilirubin 0.4 0.0 - 1.2 mg/dL   GFR, Estimated >39 >39 mL/min   Anion gap 4 (L) 5 - 15  CBC with Differential/Platelet  Result Value Ref Range   WBC 3.6 (L) 4.0 - 10.5 K/uL   RBC 4.58 3.87 - 5.11 MIL/uL   Hemoglobin 8.6 (L) 12.0 - 15.0 g/dL   HCT 70.6 (L) 63.9 - 53.9 %   MCV 64.0 (L) 80.0 - 100.0 fL   MCH 18.8 (L) 26.0 - 34.0 pg   MCHC 29.4 (L) 30.0 - 36.0 g/dL   RDW 77.0 (H) 88.4 - 84.4 %   Platelets 460 (H) 150 - 400 K/uL   nRBC 0.0 0.0 - 0.2 %   Neutrophils Relative % 47 %   Neutro Abs 1.7 1.7 - 7.7 K/uL   Lymphocytes Relative 42 %   Lymphs Abs 1.5 0.7 - 4.0 K/uL   Monocytes Relative 7 %   Monocytes Absolute 0.2 0.1 - 1.0 K/uL   Eosinophils Relative 3 %   Eosinophils Absolute 0.1 0.0 - 0.5 K/uL   Basophils Relative 1 %   Basophils Absolute 0.0 0.0 - 0.1 K/uL   WBC Morphology MORPHOLOGY UNREMARKABLE    Smear Review Normal platelet morphology    Immature Granulocytes 0 %   Abs Immature Granulocytes 0.01 0.00 - 0.07 K/uL   Target Cells PRESENT    Ovalocytes PRESENT       RADIOGRAPHIC STUDIES:  No recent pertinent imaging studies available to review.  Orders Placed This Encounter  Procedures   CBC with Differential/Platelet    Standing Status:   Future    Number of Occurrences:   1    Expiration Date:   08/07/2024   Comprehensive metabolic panel    Standing Status:   Future    Number of Occurrences:   1    Expiration Date:   08/07/2024   Iron  and Iron  Binding Capacity ([facility] only)    Standing Status:   Future    Number of Occurrences:   1    Expiration Date:   08/07/2024   Ferritin     Standing Status:   Future    Number of Occurrences:   1    Expiration Date:   08/07/2024   Lactate dehydrogenase    Standing Status:   Future    Number of Occurrences:   1    Expiration Date:   08/07/2024       I spent a total of 55 minutes during this encounter with the patient including review of chart and various tests results, discussions about plan of care and coordination of care plan.  This document was completed utilizing speech recognition software. Grammatical errors, random word insertions, pronoun errors, and incomplete sentences are an occasional consequence of this system due to software limitations, ambient noise, and hardware issues. Any formal questions or concerns about the content, text or information contained within the body of this dictation should be directly addressed to the provider for clarification.

## 2023-08-09 ENCOUNTER — Telehealth: Payer: Self-pay | Admitting: Oncology

## 2023-08-09 ENCOUNTER — Telehealth: Payer: Self-pay

## 2023-08-09 NOTE — Telephone Encounter (Signed)
 Called and spoke with patient to schedule 3 month follow up visit. Appts confirmed.

## 2023-08-09 NOTE — Telephone Encounter (Signed)
 Dr. Pasam, patient will be scheduled as soon as possible.  Auth Submission: NO AUTH NEEDED Site of care: Site of care: [facility] Payer: [employer] Medicaid Medication & CPT/J Code(s) submitted: Feraheme (ferumoxytol ) [phone] Route of submission (phone, fax, portal):  Phone # Fax # Auth type: Buy/Bill PB Units/visits requested: 510mg  x 2 doses Reference number:  Approval from: 08/09/23 to 08/05/24

## 2023-08-14 LAB — ALDOSTERONE + RENIN ACTIVITY W/ RATIO
ALDO / PRA Ratio: UNDETERMINED
Aldosterone: <1 ng/dL (ref 0.0–30.0)
PRA LC/MS/MS: <0.167 ng/mL/h — ABNORMAL LOW (ref 0.167–5.380)

## 2023-08-31 ENCOUNTER — Emergency Department (HOSPITAL_COMMUNITY): Payer: Medicaid Other

## 2023-08-31 ENCOUNTER — Other Ambulatory Visit: Payer: Self-pay

## 2023-08-31 ENCOUNTER — Emergency Department (HOSPITAL_COMMUNITY)
Admission: EM | Admit: 2023-08-31 | Discharge: 2023-08-31 | Disposition: A | Discharge status: Home / Self Care | Payer: Medicaid Other | Attending: Emergency Medicine | Admitting: Emergency Medicine

## 2023-08-31 DIAGNOSIS — S42491A Other displaced fracture of lower end of right humerus, initial encounter for closed fracture: Secondary | ICD-10-CM | POA: Diagnosis not present

## 2023-08-31 DIAGNOSIS — X509XXA Other and unspecified overexertion or strenuous movements or postures, initial encounter: Secondary | ICD-10-CM | POA: Diagnosis not present

## 2023-08-31 DIAGNOSIS — S4990XA Unspecified injury of shoulder and upper arm, unspecified arm, initial encounter: Secondary | ICD-10-CM | POA: Diagnosis present

## 2023-08-31 MED ORDER — HYDROCODONE-ACETAMINOPHEN 5-325 MG PO TABS
1.0000 | ORAL_TABLET | Freq: Once | ORAL | Status: AC
  Administered 2023-08-31: 1 via ORAL
  Filled 2023-08-31: qty 1

## 2023-08-31 MED ORDER — OXYCODONE-ACETAMINOPHEN 5-325 MG PO TABS: 1.0000 | ORAL_TABLET | Freq: Three times a day (TID) | ORAL | 0 refills | Status: DC | PRN

## 2023-08-31 MED ORDER — ACETAMINOPHEN 500 MG PO TABS
500.0000 mg | ORAL_TABLET | Freq: Four times a day (QID) | ORAL | 0 refills | Status: AC | PRN
Start: 2023-08-31 — End: ?

## 2023-08-31 NOTE — ED Provider Triage Note (Signed)
Emergency Medicine Provider Triage Evaluation Note  Eileen Miller , a 28 y.o. female  was evaluated in triage.  Pt complains of right arm pain.  Review of Systems  Positive: pain Negative: Fall, deformity  Physical Exam  BP (!) 149/93 (BP Location: Left Arm)   Pulse 66   Temp 98.5 F (36.9 C) (Oral)   Resp 16   Ht 5\' 6"  (1.676 m)   Wt 87.5 kg   SpO2 100%   BMI 31.15 kg/m  Gen:   Awake, no distress   Resp:  Normal effort  MSK:   Right arm pain with movement - involves shoulder and elbow.  Other:  Pulses normal, neurovascularly intact  Medical Decision Making  Medically screening exam initiated at 10:22 AM.  Appropriate orders placed.  Payton Moder was informed that the remainder of the evaluation will be completed by another provider, this initial triage assessment does not replace that evaluation, and the importance of remaining in the ED until their evaluation is complete.  Reports she jerked or pulled the arm this morning and now has significant shoulder and elbow pain.    Elpidio Anis, PA-C 08/31/23 1024

## 2023-08-31 NOTE — Progress Notes (Signed)
Orthopedic Tech Progress Note Patient Details:  Carlisha Wisler Aug 01, 1996 [id number]  Ortho Devices Type of Ortho Device: Coapt, Arm sling Ortho Device/Splint Location: RUE Ortho Device/Splint Interventions: Ordered, Application   Post Interventions Patient Tolerated: Lenna Sciara Tali Cleaves 08/31/2023, 12:48 PM

## 2023-08-31 NOTE — ED Triage Notes (Signed)
Patient threw a pocketbook at home this morning and feels as if her R elbow is dislocated. EMS reports deformity to same.

## 2023-08-31 NOTE — ED Provider Notes (Signed)
[place] EMERGENCY DEPARTMENT AT [facility] Provider Note   CSN: [id number] Arrival date & time: 08/31/23  5284     History  Chief Complaint  Patient presents with   Arm Injury    Eileen Miller is a 28 y.o. female.  HPI    28 year old female comes in with chief complaint of arm injury. Patient states that she was trying to scratch her pocketbook that was on a doorknob.  Upon pulling the pocketbook, she had immediate pain and deformity to the extremity.  She heard a snap and her arm was just tingling.  Pain initially was severe, but improved now.  No numbness or tingling.  Denies any fall/FOOSH mechanism.  Denies known history of osteoporosis or osteopenia.  Home Medications Prior to Admission medications   Medication Sig Start Date End Date Taking? Authorizing Provider  acetaminophen (TYLENOL) 500 MG tablet Take 1 tablet (500 mg total) by mouth every 6 (six) hours as needed. 08/31/23  Yes Ota Ebersole, MD  amLODipine (NORVASC) 5 MG tablet Take 1 tablet (5 mg total) by mouth daily. 08/04/23   Joseph Art, DO  ferrous sulfate (FERROUSUL) 325 (65 FE) MG tablet Take 1 tablet (325 mg total) by mouth daily with breakfast. 08/04/23   Joseph Art, DO  ibuprofen (ADVIL) 800 MG tablet Take 1 tablet (800 mg total) by mouth every 8 (eight) hours as needed. 06/03/19   Bovard-Stuckert, Augusto Gamble, MD  oxyCODONE-acetaminophen (PERCOCET/ROXICET) 5-325 MG tablet Take 1 tablet by mouth every 8 (eight) hours as needed for severe pain (pain score 7-10). 08/31/23   Derwood Kaplan, MD  Pediatric Multiple Vit-C-FA (FLINSTONES GUMMIES OMEGA-3 DHA) CHEW Chew 1 tablet by mouth daily. 06/03/19   Bovard-Stuckert, Augusto Gamble, MD  spironolactone (ALDACTONE) 25 MG tablet Take 1 tablet (25 mg total) by mouth daily. 08/04/23   Joseph Art, DO  Vitamin D, Ergocalciferol, (DRISDOL) 1.25 MG (50000 UNIT) CAPS capsule Take 1 capsule (50,000 Units total) by mouth every 7 (seven) days. Patient not taking:  Reported on 08/08/2023 08/09/23   Joseph Art, DO      Allergies    Food    Review of Systems   Review of Systems  All other systems reviewed and are negative.   Physical Exam Updated Vital Signs BP (!) 148/104   Pulse 67   Temp (!) 97.5 F (36.4 C) (Oral)   Resp 18   Ht 5\' 6"  (1.676 m)   Wt 87.5 kg   SpO2 100%   BMI 31.15 kg/m  Physical Exam Vitals and nursing note reviewed.  Constitutional:      Appearance: She is well-developed.  HENT:     Head: Atraumatic.  Cardiovascular:     Rate and Rhythm: Normal rate.  Pulmonary:     Effort: Pulmonary effort is normal.  Musculoskeletal:        General: Swelling, tenderness and deformity present.     Cervical back: Normal range of motion and neck supple.     Comments: Swelling noted over the distal humerus region  Skin:    General: Skin is warm and dry.  Neurological:     Mental Status: She is alert and oriented to person, place, and time.     ED Results / Procedures / Treatments   Labs (all labs ordered are listed, but only abnormal results are displayed) Labs Reviewed - No data to display  EKG None  Radiology DG Shoulder Right Result Date: 08/31/2023 CLINICAL DATA:  Pain, right arm injury.  EXAM: RIGHT HUMERUS - 2+ VIEW; RIGHT SHOULDER - 2+ VIEW COMPARISON:  None Available. FINDINGS: Humerus: Comminuted displaced fracture of the distal humeral shaft. The distal fracture fragment is displaced 9 mm laterally with 11 mm osseous overriding. There is medial displacement of a butterfly fragment. No intra-articular involvement of the elbow joint which remains congruent. No elbow joint effusion. The proximal humerus is intact. Shoulder: No fracture or dislocation of the shoulder. The alignment and joint spaces are preserved. No erosive change or focal bone abnormality. Unremarkable soft tissues. IMPRESSION: 1. Comminuted displaced distal humeral shaft fracture. 2. Normal shoulder alignment without additional fracture.  Electronically Signed   By: Narda Rutherford M.D.   On: 08/31/2023 12:31   DG Humerus Right Result Date: 08/31/2023 CLINICAL DATA:  Pain, right arm injury. EXAM: RIGHT HUMERUS - 2+ VIEW; RIGHT SHOULDER - 2+ VIEW COMPARISON:  None Available. FINDINGS: Humerus: Comminuted displaced fracture of the distal humeral shaft. The distal fracture fragment is displaced 9 mm laterally with 11 mm osseous overriding. There is medial displacement of a butterfly fragment. No intra-articular involvement of the elbow joint which remains congruent. No elbow joint effusion. The proximal humerus is intact. Shoulder: No fracture or dislocation of the shoulder. The alignment and joint spaces are preserved. No erosive change or focal bone abnormality. Unremarkable soft tissues. IMPRESSION: 1. Comminuted displaced distal humeral shaft fracture. 2. Normal shoulder alignment without additional fracture. Electronically Signed   By: Narda Rutherford M.D.   On: 08/31/2023 12:31    Procedures Procedures    Medications Ordered in ED Medications  HYDROcodone-acetaminophen (NORCO/VICODIN) 5-325 MG per tablet 1 tablet (1 tablet Oral Given 08/31/23 1040)  HYDROcodone-acetaminophen (NORCO/VICODIN) 5-325 MG per tablet 1 tablet (1 tablet Oral Given 08/31/23 1229)    ED Course/ Medical Decision Making/ A&P                                 Medical Decision Making Amount and/or Complexity of Data Reviewed Radiology: ordered.  Risk OTC drugs. Prescription drug management.   28 year old female comes in with chief complaint of arm injury. Mechanism of injury is pulling her pocketbook, which was hanging off of a doorknob and having immediate deformity with pain.  No FOOSH mechanism.  Patient is neurovascularly intact.  Differential includes elbow avulsion, elbow dislocation, elbow fracture, shoulder dislocation.  X-rays ordered and independently interpreted by me.  Patient has comminuted, displaced distal humerus fracture, no  elbow involvement, no shoulder involvement.  X-ray findings not congruent with the mechanism of injury.  I informed patient that the fracture is quite significant for the mechanism she describes.  Her boyfriend is at the bedside.  She maintains the mechanism being pulling her pocketbook from a doorknob.  No signs of trauma elsewhere.  I reviewed patient's previous records, no red flags seen.  Will advise her to follow-up with orthopedic surgery.  Informed the patient that the break is significant, that she will need to follow-up with orthopedic surgery in 7 days and that she might need surgical intervention.  Final Clinical Impression(s) / ED Diagnoses Final diagnoses:  Other closed displaced fracture of distal end of right humerus, initial encounter    Rx / DC Orders ED Discharge Orders          Ordered    acetaminophen (TYLENOL) 500 MG tablet  Every 6 hours PRN        08/31/23 1249    oxyCODONE-acetaminophen (PERCOCET/ROXICET) 5-325  MG tablet  Every 8 hours PRN,   Status:  Discontinued        08/31/23 1249    oxyCODONE-acetaminophen (PERCOCET/ROXICET) 5-325 MG tablet  Every 8 hours PRN        08/31/23 1251              Derwood Kaplan, MD 08/31/23 1259

## 2023-08-31 NOTE — Discharge Instructions (Signed)
You were seen in the emergency room for arm injury.  X-ray confirms a fracture right above your elbow.  The fracture is displaced and could potentially need surgical intervention.  We recommend that you call the orthopedist on Monday for an appointment in 3 to 7 days.  Take Tylenol every 6 hours.  Take the stronger narcotic pain medicine in between if needed.

## 2023-09-06 ENCOUNTER — Ambulatory Visit: Payer: Medicaid Other

## 2023-09-06 MED ORDER — DIPHENHYDRAMINE HCL 25 MG PO CAPS: 25.0000 mg | ORAL_CAPSULE | Freq: Once | ORAL | Status: AC

## 2023-09-06 MED ORDER — ACETAMINOPHEN 325 MG PO TABS: 650.0000 mg | ORAL_TABLET | Freq: Once | ORAL | Status: AC

## 2023-09-09 ENCOUNTER — Other Ambulatory Visit: Payer: Self-pay

## 2023-09-09 ENCOUNTER — Encounter (HOSPITAL_COMMUNITY): Payer: Self-pay | Admitting: Orthopedic Surgery

## 2023-09-09 NOTE — Progress Notes (Signed)
SDW call  Patient was given pre-op instructions over the phone. Patient verbalized understanding of instructions provided.     PCP - Dr. Lavina Hamman Cardiologist -  Pulmonary:    PPM/ICD - denies Device Orders - na Rep Notified - na   Chest x-ray - na EKG -  08/02/2023 Stress Test - ECHO -  Cardiac Cath -   Sleep Study/sleep apnea/CPAP: denies  Non-diabetic  Blood Thinner Instructions: denies Aspirin Instructions:denies   ERAS Protcol - Clears until 1245   Anesthesia review: No   Patient denies shortness of breath, fever, cough and chest pain over the phone call  Your procedure is scheduled on Wednesday September 11, 2023  Report to [facility] Main Entrance "A" at  1315 PM  then check in with the Admitting office.  Call this number if you have problems the morning of surgery:  [phone]   If you have any questions prior to your surgery date call [phone]: Open Monday-Friday 8am-4pm If you experience any cold or flu symptoms such as cough, fever, chills, shortness of breath, etc. between now and your scheduled surgery, please notify us at the above number    Remember:  Do not eat after midnight the night before your surgery  You may drink clear liquids until  1245 the morning of your surgery.   Clear liquids allowed are: Water, Non-Citrus Juices (without pulp), Carbonated Beverages, Clear Tea, Black Coffee ONLY (NO MILK, CREAM OR POWDERED CREAMER of any kind), and Gatorade   Take these medicines the morning of surgery with A SIP OF WATER:  Amlodipine, metoprolol  As needed: Tylenol, percocet  As of today, STOP taking any Aspirin (unless otherwise instructed by your surgeon) Aleve, Naproxen, Ibuprofen, Motrin, Advil, Goody's, BC's, all herbal medications, fish oil, and all vitamins.

## 2023-09-11 ENCOUNTER — Ambulatory Visit (HOSPITAL_COMMUNITY)
Admission: RE | Admit: 2023-09-11 | Discharge: 2023-09-11 | Disposition: A | Discharge status: Home / Self Care | Payer: Medicaid Other | Attending: Orthopedic Surgery | Admitting: Orthopedic Surgery

## 2023-09-11 ENCOUNTER — Ambulatory Visit (HOSPITAL_COMMUNITY): Payer: Medicaid Other | Admitting: Anesthesiology

## 2023-09-11 ENCOUNTER — Encounter (HOSPITAL_COMMUNITY)
Admission: RE | Disposition: A | Discharge status: Home / Self Care | Payer: Self-pay | Source: Home / Self Care | Attending: Orthopedic Surgery

## 2023-09-11 ENCOUNTER — Encounter (HOSPITAL_COMMUNITY): Payer: Self-pay | Admitting: Orthopedic Surgery

## 2023-09-11 ENCOUNTER — Other Ambulatory Visit: Payer: Self-pay

## 2023-09-11 ENCOUNTER — Ambulatory Visit (HOSPITAL_COMMUNITY): Payer: Medicaid Other

## 2023-09-11 DIAGNOSIS — S42301A Unspecified fracture of shaft of humerus, right arm, initial encounter for closed fracture: Secondary | ICD-10-CM | POA: Diagnosis present

## 2023-09-11 DIAGNOSIS — S42401A Unspecified fracture of lower end of right humerus, initial encounter for closed fracture: Secondary | ICD-10-CM

## 2023-09-11 DIAGNOSIS — I10 Essential (primary) hypertension: Secondary | ICD-10-CM | POA: Insufficient documentation

## 2023-09-11 DIAGNOSIS — X509XXA Other and unspecified overexertion or strenuous movements or postures, initial encounter: Secondary | ICD-10-CM | POA: Insufficient documentation

## 2023-09-11 HISTORY — PX: ORIF HUMERUS FRACTURE: SHX2126

## 2023-09-11 LAB — BASIC METABOLIC PANEL
Anion gap: 11 (ref 5–15)
BUN: 12 mg/dL (ref 6–20)
CO2: 20 mmol/L — ABNORMAL LOW (ref 22–32)
Calcium: 8.9 mg/dL (ref 8.9–10.3)
Chloride: 107 mmol/L (ref 98–111)
Creatinine, Ser: 0.71 mg/dL (ref 0.44–1.00)
GFR, Estimated: >60 mL/min (ref >60)
Glucose, Bld: 77 mg/dL (ref 70–99)
Potassium: 3 mmol/L — ABNORMAL LOW (ref 3.5–5.1)
Sodium: 138 mmol/L (ref 135–145)

## 2023-09-11 LAB — CBC
HCT: 29.7 % — ABNORMAL LOW (ref 36.0–46.0)
Hemoglobin: 8.8 g/dL — ABNORMAL LOW (ref 12.0–15.0)
MCH: 21.3 pg — ABNORMAL LOW (ref 26.0–34.0)
MCHC: 29.6 g/dL — ABNORMAL LOW (ref 30.0–36.0)
MCV: 71.9 fL — ABNORMAL LOW (ref 80.0–100.0)
Platelets: 455 10*3/uL — ABNORMAL HIGH (ref 150–400)
RBC: 4.13 MIL/uL (ref 3.87–5.11)
RDW: 23.6 % — ABNORMAL HIGH (ref 11.5–15.5)
WBC: 6.3 10*3/uL (ref 4.0–10.5)
nRBC: 0 % (ref 0.0–0.2)

## 2023-09-11 LAB — POCT PREGNANCY, URINE: Preg Test, Ur: NEGATIVE

## 2023-09-11 SURGERY — OPEN REDUCTION INTERNAL FIXATION (ORIF) DISTAL HUMERUS FRACTURE
Anesthesia: Monitor Anesthesia Care | Laterality: Right

## 2023-09-11 MED ORDER — ACETAMINOPHEN 500 MG PO TABS
1000.0000 mg | ORAL_TABLET | Freq: Once | ORAL | Status: AC
  Administered 2023-09-11: 1000 mg via ORAL
  Filled 2023-09-11: qty 2

## 2023-09-11 MED ORDER — FENTANYL CITRATE (PF) 250 MCG/5ML IJ SOLN
INTRAMUSCULAR | Status: DC | PRN
  Administered 2023-09-11: 100 ug via INTRAVENOUS

## 2023-09-11 MED ORDER — ACETAMINOPHEN 500 MG PO TABS: 1000.0000 mg | ORAL_TABLET | Freq: Once | ORAL | Status: DC | PRN

## 2023-09-11 MED ORDER — LACTATED RINGERS IV SOLN: Freq: Once | INTRAVENOUS | Status: AC

## 2023-09-11 MED ORDER — ACETAMINOPHEN 10 MG/ML IV SOLN: 1000.0000 mg | Freq: Once | INTRAVENOUS | Status: DC | PRN

## 2023-09-11 MED ORDER — OXYCODONE HCL 5 MG PO TABS: 5.0000 mg | ORAL_TABLET | Freq: Once | ORAL | Status: DC | PRN

## 2023-09-11 MED ORDER — PROPOFOL 10 MG/ML IV BOLUS
INTRAVENOUS | Status: DC | PRN
  Administered 2023-09-11: 200 mg via INTRAVENOUS

## 2023-09-11 MED ORDER — ONDANSETRON HCL 4 MG/2ML IJ SOLN
INTRAMUSCULAR | Status: DC | PRN
  Administered 2023-09-11: 4 mg via INTRAVENOUS

## 2023-09-11 MED ORDER — BUPIVACAINE-EPINEPHRINE (PF) 0.5% -1:200000 IJ SOLN
INTRAMUSCULAR | Status: DC | PRN
  Administered 2023-09-11: 25 mL via PERINEURAL

## 2023-09-11 MED ORDER — OXYCODONE HCL 5 MG/5ML PO SOLN: 5.0000 mg | Freq: Once | ORAL | Status: DC | PRN

## 2023-09-11 MED ORDER — METOPROLOL TARTRATE 5 MG/5ML IV SOLN
INTRAVENOUS | Status: DC | PRN
  Administered 2023-09-11: 2.5 mg via INTRAVENOUS

## 2023-09-11 MED ORDER — LACTATED RINGERS IV SOLN: INTRAVENOUS | Status: DC | PRN

## 2023-09-11 MED ORDER — DEXAMETHASONE SODIUM PHOSPHATE 10 MG/ML IJ SOLN
INTRAMUSCULAR | Status: AC
  Filled 2023-09-11: qty 1

## 2023-09-11 MED ORDER — FENTANYL CITRATE (PF) 100 MCG/2ML IJ SOLN: 25.0000 ug | INTRAMUSCULAR | Status: DC | PRN

## 2023-09-11 MED ORDER — CEFAZOLIN SODIUM-DEXTROSE 2-4 GM/100ML-% IV SOLN
2.0000 g | INTRAVENOUS | Status: AC
  Administered 2023-09-11: 2 g via INTRAVENOUS
  Filled 2023-09-11: qty 100

## 2023-09-11 MED ORDER — LIDOCAINE 2% (20 MG/ML) 5 ML SYRINGE
INTRAMUSCULAR | Status: AC
  Filled 2023-09-11: qty 5

## 2023-09-11 MED ORDER — PHENYLEPHRINE HCL-NACL 20-0.9 MG/250ML-% IV SOLN
INTRAVENOUS | Status: DC | PRN
  Administered 2023-09-11: 15 ug/min via INTRAVENOUS

## 2023-09-11 MED ORDER — FENTANYL CITRATE (PF) 100 MCG/2ML IJ SOLN
INTRAMUSCULAR | Status: AC
  Filled 2023-09-11: qty 2

## 2023-09-11 MED ORDER — ROCURONIUM BROMIDE 100 MG/10ML IV SOLN
INTRAVENOUS | Status: DC | PRN
  Administered 2023-09-11: 70 mg via INTRAVENOUS
  Administered 2023-09-11 (×2): 10 mg via INTRAVENOUS

## 2023-09-11 MED ORDER — PHENYLEPHRINE 80 MCG/ML (10ML) SYRINGE FOR IV PUSH (FOR BLOOD PRESSURE SUPPORT)
PREFILLED_SYRINGE | INTRAVENOUS | Status: AC
  Filled 2023-09-11: qty 10

## 2023-09-11 MED ORDER — LIDOCAINE-EPINEPHRINE (PF) 1.5 %-1:200000 IJ SOLN
INTRAMUSCULAR | Status: DC | PRN
  Administered 2023-09-11: 5 mL via PERINEURAL

## 2023-09-11 MED ORDER — 0.9 % SODIUM CHLORIDE (POUR BTL) OPTIME
TOPICAL | Status: DC | PRN
  Administered 2023-09-11: 1000 mL

## 2023-09-11 MED ORDER — PHENYLEPHRINE 80 MCG/ML (10ML) SYRINGE FOR IV PUSH (FOR BLOOD PRESSURE SUPPORT)
PREFILLED_SYRINGE | INTRAVENOUS | Status: DC | PRN
  Administered 2023-09-11: 80 ug via INTRAVENOUS
  Administered 2023-09-11: 160 ug via INTRAVENOUS
  Administered 2023-09-11: 80 ug via INTRAVENOUS
  Administered 2023-09-11: 160 ug via INTRAVENOUS
  Administered 2023-09-11 (×2): 80 ug via INTRAVENOUS

## 2023-09-11 MED ORDER — CHLORHEXIDINE GLUCONATE 0.12 % MT SOLN
OROMUCOSAL | Status: AC
  Administered 2023-09-11: 15 mL via OROMUCOSAL
  Filled 2023-09-11: qty 15

## 2023-09-11 MED ORDER — PROPOFOL 10 MG/ML IV BOLUS
INTRAVENOUS | Status: AC
  Filled 2023-09-11: qty 20

## 2023-09-11 MED ORDER — OXYCODONE HCL 5 MG PO TABS: 5.0000 mg | ORAL_TABLET | Freq: Four times a day (QID) | ORAL | 0 refills | Status: AC | PRN

## 2023-09-11 MED ORDER — CHLORHEXIDINE GLUCONATE 0.12 % MT SOLN
15.0000 mL | OROMUCOSAL | Status: AC
  Filled 2023-09-11: qty 15

## 2023-09-11 MED ORDER — DEXAMETHASONE SODIUM PHOSPHATE 10 MG/ML IJ SOLN
INTRAMUSCULAR | Status: DC | PRN
  Administered 2023-09-11: 5 mg via INTRAVENOUS

## 2023-09-11 MED ORDER — FENTANYL CITRATE (PF) 250 MCG/5ML IJ SOLN
INTRAMUSCULAR | Status: AC
  Filled 2023-09-11: qty 5

## 2023-09-11 MED ORDER — MIDAZOLAM HCL 2 MG/2ML IJ SOLN
2.0000 mg | Freq: Once | INTRAMUSCULAR | Status: AC
  Filled 2023-09-11: qty 2

## 2023-09-11 MED ORDER — BUPIVACAINE HCL (PF) 0.25 % IJ SOLN
INTRAMUSCULAR | Status: AC
  Filled 2023-09-11: qty 30

## 2023-09-11 MED ORDER — FENTANYL CITRATE (PF) 100 MCG/2ML IJ SOLN
100.0000 ug | Freq: Once | INTRAMUSCULAR | Status: DC
  Filled 2023-09-11: qty 2

## 2023-09-11 MED ORDER — ROCURONIUM BROMIDE 10 MG/ML (PF) SYRINGE
PREFILLED_SYRINGE | INTRAVENOUS | Status: AC
  Filled 2023-09-11: qty 10

## 2023-09-11 MED ORDER — ONDANSETRON HCL 4 MG/2ML IJ SOLN
INTRAMUSCULAR | Status: AC
  Filled 2023-09-11: qty 2

## 2023-09-11 MED ORDER — ACETAMINOPHEN 160 MG/5ML PO SOLN: 1000.0000 mg | Freq: Once | ORAL | Status: DC | PRN

## 2023-09-11 MED ORDER — MIDAZOLAM HCL 2 MG/2ML IJ SOLN
INTRAMUSCULAR | Status: AC
  Administered 2023-09-11: 2 mg via INTRAVENOUS
  Filled 2023-09-11: qty 2

## 2023-09-11 MED ORDER — SUGAMMADEX SODIUM 200 MG/2ML IV SOLN
INTRAVENOUS | Status: DC | PRN
  Administered 2023-09-11: 200 mg via INTRAVENOUS

## 2023-09-11 SURGICAL SUPPLY — 64 items
BAG COUNTER SPONGE SURGICOUNT (BAG) ×1 IMPLANT
BIT DRILL CALIBRATED 2.7 (BIT) IMPLANT
BLADE CLIPPER SURG (BLADE) IMPLANT
BNDG ELASTIC 3INX 5YD STR LF (GAUZE/BANDAGES/DRESSINGS) ×3 IMPLANT
BNDG ELASTIC 4INX 5YD STR LF (GAUZE/BANDAGES/DRESSINGS) IMPLANT
BNDG ELASTIC 6INX 5YD STR LF (GAUZE/BANDAGES/DRESSINGS) IMPLANT
BNDG ESMARK 4X9 LF (GAUZE/BANDAGES/DRESSINGS) ×1 IMPLANT
BNDG GAUZE DERMACEA FLUFF 4 (GAUZE/BANDAGES/DRESSINGS) ×3 IMPLANT
CORD BIPOLAR FORCEPS 12FT (ELECTRODE) ×1 IMPLANT
COVER MAYO STAND STRL (DRAPES) IMPLANT
COVER SURGICAL LIGHT HANDLE (MISCELLANEOUS) ×1 IMPLANT
CUFF TOURN SGL QUICK 18X4 (TOURNIQUET CUFF) ×1 IMPLANT
CUFF TRNQT CYL 24X4X16.5-23 (TOURNIQUET CUFF) IMPLANT
DRAPE INCISE IOBAN 66X45 STRL (DRAPES) IMPLANT
DRAPE OEC MINIVIEW 54X84 (DRAPES) IMPLANT
DRAPE U-SHAPE 47X51 STRL (DRAPES) ×1 IMPLANT
DRSG ADAPTIC 3X8 NADH LF (GAUZE/BANDAGES/DRESSINGS) ×1 IMPLANT
GAUZE SPONGE 4X4 12PLY STRL (GAUZE/BANDAGES/DRESSINGS) ×1 IMPLANT
GAUZE XEROFORM 5X9 LF (GAUZE/BANDAGES/DRESSINGS) ×1 IMPLANT
GLOVE BIO SURGEON STRL SZ7 (GLOVE) IMPLANT
GLOVE BIOGEL M 8.0 STRL (GLOVE) ×1 IMPLANT
GLOVE BIOGEL PI IND STRL 8 (GLOVE) IMPLANT
GLOVE SS BIOGEL STRL SZ 8 (GLOVE) ×1 IMPLANT
GOWN STRL REUS W/ TWL LRG LVL3 (GOWN DISPOSABLE) ×3 IMPLANT
GOWN STRL REUS W/ TWL XL LVL3 (GOWN DISPOSABLE) ×3 IMPLANT
KIT BASIN OR (CUSTOM PROCEDURE TRAY) ×1 IMPLANT
KIT TURNOVER KIT B (KITS) ×1 IMPLANT
LOOP VASCLR MAXI BLUE 18IN ST (MISCELLANEOUS) IMPLANT
MANIFOLD NEPTUNE II (INSTRUMENTS) ×1 IMPLANT
NDL 22X1.5 STRL (OR ONLY) (MISCELLANEOUS) IMPLANT
NEEDLE 22X1.5 STRL (OR ONLY) (MISCELLANEOUS)
NS IRRIG 1000ML POUR BTL (IV SOLUTION) ×1 IMPLANT
PACK ORTHO EXTREMITY (CUSTOM PROCEDURE TRAY) ×1 IMPLANT
PAD ARMBOARD 7.5X6 YLW CONV (MISCELLANEOUS) ×2 IMPLANT
PAD CAST 4YDX4 CTTN HI CHSV (CAST SUPPLIES) ×3 IMPLANT
PLATE LOCK ALPS 250 25H RT (Plate) IMPLANT
SCREW CORT T15 24X3.5XST LCK (Screw) IMPLANT
SCREW CORT T15 26X3.5XST LCK (Screw) IMPLANT
SCREW CORTICAL LOW PROF 3.5X20 (Screw) IMPLANT
SCREW LOCK CORT STAR 3.5X16 (Screw) IMPLANT
SCREW LOCK CORT STAR 3.5X18 (Screw) IMPLANT
SCREW LOW PROFILE 18MMX3.5MM (Screw) IMPLANT
SCREW LOW PROFILE 22MMX3.5MM (Screw) IMPLANT
SCREW NON LOCKING LP 3.5 16MM (Screw) IMPLANT
SLING ARM FOAM STRAP XLG (SOFTGOODS) IMPLANT
SOL PREP POV-IOD 4OZ 10% (MISCELLANEOUS) ×2 IMPLANT
SPIKE FLUID TRANSFER (MISCELLANEOUS) IMPLANT
SPONGE T-LAP 18X18 ~~LOC~~+RFID (SPONGE) IMPLANT
SPONGE T-LAP 4X18 ~~LOC~~+RFID (SPONGE) IMPLANT
SUT ETHILON 4 0 PS 2 18 (SUTURE) IMPLANT
SUT MNCRL AB 4-0 PS2 18 (SUTURE) ×1 IMPLANT
SUT PROLENE 3 0 PS 2 (SUTURE) IMPLANT
SUT PROLENE 4 0 PS 2 18 (SUTURE) IMPLANT
SUT VIC AB 2-0 CT1 TAPERPNT 27 (SUTURE) IMPLANT
SUT VIC AB 3-0 FS2 27 (SUTURE) IMPLANT
SYR CONTROL 10ML LL (SYRINGE) IMPLANT
TOWEL GREEN STERILE (TOWEL DISPOSABLE) ×1 IMPLANT
TOWEL GREEN STERILE FF (TOWEL DISPOSABLE) ×1 IMPLANT
TUBE CONNECTING 12X1/4 (SUCTIONS) ×1 IMPLANT
UNDERPAD 30X36 HEAVY ABSORB (UNDERPADS AND DIAPERS) ×1 IMPLANT
VASCULAR TIE MAXI BLUE 18IN ST (MISCELLANEOUS) ×1
WASHER 3.5MM (Orthopedic Implant) IMPLANT
WATER STERILE IRR 1000ML POUR (IV SOLUTION) ×1 IMPLANT
YANKAUER SUCT BULB TIP NO VENT (SUCTIONS) IMPLANT

## 2023-09-11 NOTE — Interval H&P Note (Signed)
 History and Physical Interval Note:  09/11/2023 3:29 PM  Eileen Miller  has presented today for surgery, with the diagnosis of Right distal humerus fracture.  The various methods of treatment have been discussed with the patient and family. After consideration of risks, benefits and other options for treatment, the patient has consented to  Procedure(s) with comments: OPEN REDUCTION INTERNAL FIXATION (ORIF)DISTAL  HUMERUS FRACTURE (Right) - with regional block, he wants this case at 3:45 and then irrigation to follow as a surgical intervention.  The patient's history has been reviewed, patient examined, no change in status, stable for surgery.  I have reviewed the patient's chart and labs.  Questions were answered to the patient's satisfaction.     [name]

## 2023-09-11 NOTE — Anesthesia Postprocedure Evaluation (Signed)
 Anesthesia Post Note  Patient: Eileen Miller  Procedure(s) Performed: OPEN REDUCTION INTERNAL FIXATION (ORIF)DISTAL  HUMERUS FRACTURE (Right)     Patient location during evaluation: PACU Anesthesia Type: Regional Level of consciousness: awake and alert, patient cooperative and oriented Pain management: pain level controlled Vital Signs Assessment: post-procedure vital signs reviewed and stable Respiratory status: spontaneous breathing, nonlabored ventilation and respiratory function stable Cardiovascular status: blood pressure returned to baseline and stable Postop Assessment: no apparent nausea or vomiting and able to ambulate Anesthetic complications: no   No notable events documented.  Last Vitals:  Vitals:   09/11/23 1945 09/11/23 2000  BP: (!) 148/97 (!) 151/99  Pulse: 74 84  Resp: (!) 24 (!) 27  Temp:  36.4 C  SpO2: 98% 98%    Last Pain:  Vitals:   09/11/23 2000  TempSrc:   PainSc: 0-No pain                 Arles Rumbold,E. Adarsh Mundorf

## 2023-09-11 NOTE — Anesthesia Procedure Notes (Addendum)
 Anesthesia Regional Block: Supraclavicular block   Pre-Anesthetic Checklist: , timeout performed,  Correct Patient, Correct Site, Correct Laterality,  Correct Procedure, Correct Position, site marked,  Risks and benefits discussed,  Surgical consent,  Pre-op evaluation,  At surgeon's request and post-op pain management  Laterality: Right and Upper  Prep: chloraprep       Needles:  Injection technique: Single-shot      Needle Length: 5cm  Needle Gauge: 22     Additional Needles: Arrow StimuQuik ECHO Echogenic Stimulating PNB Needle  Procedures:,,,, ultrasound used (permanent image in chart),,    Narrative:  Start time: 09/11/2023 2:21 PM End time: 09/11/2023 2:30 PM Injection made incrementally with aspirations every 5 mL.  Performed by: Personally  Anesthesiologist: Leopoldo Bruckner, MD

## 2023-09-11 NOTE — Anesthesia Procedure Notes (Signed)
 Procedure Name: Intubation Date/Time: 09/11/2023 3:55 PM  Performed by: Keelynn Furgerson J, CRNAPre-anesthesia Checklist: Patient identified, Emergency Drugs available, Suction available and Patient being monitored Patient Re-evaluated:Patient Re-evaluated prior to induction Oxygen Delivery Method: Circle System Utilized Preoxygenation: Pre-oxygenation with 100% oxygen Induction Type: IV induction Ventilation: Mask ventilation without difficulty Laryngoscope Size: Miller and 3 Grade View: Grade I Tube type: Oral Tube size: 7.0 mm Number of attempts: 1 Airway Equipment and Method: Stylet and Oral airway Placement Confirmation: ETT inserted through vocal cords under direct vision, positive ETCO2 and breath sounds checked- equal and bilateral Secured at: 22 cm Tube secured with: Tape Dental Injury: Teeth and Oropharynx as per pre-operative assessment

## 2023-09-11 NOTE — Op Note (Signed)
 Date of Surgery: 09/11/2023  INDICATIONS: Patient is a 28 y.o.-year-old female with a closed, right, comminuted distal third humerus shaft fracture.  He was seen in the office where we reviewed the nature of her injury and discussed both conservative and surgical treatment.  She presents today for open reduction and internal fixation of the fracture.  Risks, benefits, and alternatives to surgery were again discussed with the patient in the preoperative area. The patient wishes to proceed with surgery.  Informed consent was signed after our discussion.   PREOPERATIVE DIAGNOSIS:  Right distal humerus fracture, extra-articular  POSTOPERATIVE DIAGNOSIS: Same.  PROCEDURE:  Open reduction and internal fixation of right, extra-articular distal humerus fracture   SURGEON: Carlin Galla, M.D.  ASSIST: Prentice Pagan, M.D.  ANESTHESIA:  general, regional  IV FLUIDS AND URINE: See anesthesia.  ESTIMATED BLOOD LOSS: 50 mL.  IMPLANTS:  Implant Name Type Inv. Item Serial No. Manufacturer Lot No. LRB No. Used Action  LOG [id number] -  BIOMET DVR HAND INNOVATIONS SET - 1          SCREW CORT T15 24X3.5XST LCK - [id number] Screw SCREW CORT T15 24X3.5XST LCK  [employer](ORTH,TRAU,BIO,SG)  Right 1 Implanted  SCREW CORTICAL LOW PROF 3.5X20 - [id number] Screw SCREW CORTICAL LOW PROF 3.5X20  [employer](ORTH,TRAU,BIO,SG)  Right 4 Implanted  SCREW NON LOCKING LP 3.5 - [id number] Screw SCREW NON LOCKING LP 3.5  [employer]ON(ORTH,TRAU,BIO,SG)  Right 1 Implanted  SCREW CORT T15 26X3.5XST LCK - [id number] Screw SCREW CORT T15 26X3.5XST LCK  [employer]MER [employer](ORTH,TRAU,BIO,SG)  Right 1 Implanted  SCREW LOW PROFILE 22MMX3. - [id number] Screw SCREW LOW PROFILE 22MMX3.  [employer]IMMER [employer]ON(ORTH,TRAU,BIO,SG)  Right 5 Implanted  SCREW LOW PROFILE 18MMX3. - [id number] Screw SCREW LOW PROFILE 18MMX3.  [employer]MER [employer]ON(ORTH,TRAU,BIO,SG)  Right 1 Implanted  SCREW LOCK CORT STAR 3.5X16 - [id number] Screw SCREW  LOCK CORT STAR 3.5X16  [employer]MER [employer](ORTH,TRAU,BIO,SG)  Right 1 Implanted  SCREW LOCK CORT STAR 3.5X18 - [id number] Screw SCREW LOCK CORT STAR 3.5X18  [employer](ORTH,TRAU,BIO,SG)  Right 2 Implanted  SCREW CORT T15 24X3.5XST LCK - [id number] Screw SCREW CORT T15 24X3.5XST LCK  [employer]MER [employer]ON(ORTH,TRAU,BIO,SG)  Right 1 Implanted  WASHER 3.5MM - [id number] Orthopedic Implant WASHER 3.5MM  Z[employer]MER [employer]ON(ORTH,TRAU,BIO,SG)  Right 5 Implanted  Posterior Lateral Plate 25 hole right      Right 1 Implanted     DRAINS: None  COMPLICATIONS: None  DESCRIPTION OF PROCEDURE: The patient was met in the preoperative holding area where the surgical site was marked and the consent form was signed.  The patient was then taken to the operating room and transferred to the operating table.  All bony prominences were well padded.  General endotracheal anesthesia  was induced.  The patient was positioned in the lateral decubitus position with the right side up.  An axillary roll was carefully placed.  The left elbow was padded appropriately.  The right upper extremity was positioned on a SchureFoot positioner.  The operative extremity was prepped and draped in the usual and sterile fashion.  A formal time-out was performed to confirm that this was the correct patient, surgery, side, and site.   Following formal timeout, a longitudinal incision was made along the posterior aspect of the humerus curving laterally around the tip of the olecranon.  The skin was divided.  Bovie electrocautery was used to dissect through the subcutaneous tissue to level the triceps fascia.  The triceps fascia was divided.  Dissection was began distally to identify the bare  area at the lateral aspect of the distal humerus.  Blunt dissection was used to identify the lower lateral cutaneous nerve branch adjacent to the intermuscular septum.  This nerve branch was followed proximally to the radial nerve proper.  The radial nerve proper was carefully  identified and protected.  It was also dissected distally through the intermuscular septum to allow for tension-free retraction.  A small vessel loop was wrapped around the nerve and tied.  A Cobb elevator was then used to elevate the posterior aspect of the tricep off of the posterior aspect of the humeral shaft.  The fracture site was easily identified.  The fracture was debrided of all hematoma and early callus using a combination of curette and rondure.  The fracture was reduced and clamped proximally initially using several reduction clamps.  A 3.5 mm lag screw was then placed oblique to the fracture line.  The distal aspect of the butterfly fragment was then reduced to the distal segment of the humerus.  It was again fixed using several 3.5 mm lag screws.  With the fracture adequately reduced, a 3.5 mm posterolateral plate was selected.  The only available plate was quite long and was cut to an appropriate length.  The cut end of the plate were smoothed using a file.  It was then slid proximally underneath the radial nerve.  The radial nerve was sitting over the plate at the level of the proximalmost screw.  The plate was then fixed to the bone distally using a bicortical nonlocking screw.  The plate was then fixed proximally using nonlocking screws with excellent purchase.  There were 8 cortices of fixation proximal to the fracture  The appropriate holes in the distal aspect of the plate were then filled with a combination of locking and nonlocking screws.  AP and lateral x-rays showed that the fracture appeared to be appropriately reduced.  The plate was in appropriate position and all screws were of the appropriate lengths.  The wound was then thoroughly irrigated with copious sterile saline.  It was closed in a layered fashion.  I began with a 2-0 Vicryl suture in simple fashion to close the triceps fascia.  This was followed by a 4-0 Vicryl for the subcutaneous tissue and a 4-0 nylon suture in running  horizontal mattress fashion.  The wound was then cleaned and dressed with Xeroform, folded Kerlix, cast padding, and a well-padded long-arm posterior splint was applied.  The patient was reversed from anesthesia and extubated uneventfully.  They were transferred from the operating table to the postoperative bed.  All counts were correct x 2 at the end of the procedure.  The patient was then taken to the PACU in stable condition.   POSTOPERATIVE PLAN: She will be discharged home with appropriate pain medication and discharge instructions.  I will see her back in the office in 10 to 14 days for her first postop visit.  Carlin Galla, MD 7:11 PM

## 2023-09-11 NOTE — Transfer of Care (Signed)
 Immediate Anesthesia Transfer of Care Note  Patient: Eileen Miller  Procedure(s) Performed: OPEN REDUCTION INTERNAL FIXATION (ORIF)DISTAL  HUMERUS FRACTURE (Right)  Patient Location: PACU  Anesthesia Type:General  Level of Consciousness: drowsy  Airway & Oxygen Therapy: Patient Spontanous Breathing and Patient connected to face mask oxygen  Post-op Assessment: Report given to RN and Post -op Vital signs reviewed and stable  Post vital signs: Reviewed and stable  Last Vitals:  Vitals Value Taken Time  BP 151/99 09/11/23 2000  Temp 36.4 C 09/11/23 2000  Pulse 84 09/11/23 2000  Resp 26 09/11/23 2000  SpO2 98 % 09/11/23 2000  Vitals shown include unfiled device data.  Last Pain:  Vitals:   09/11/23 2000  TempSrc:   PainSc: 0-No pain         Complications: No notable events documented.

## 2023-09-11 NOTE — H&P (Signed)
 HAND SURGERY   HPI: Patient is a 28 y.o. female who presents with a closed, right, distal third humeral shaft fracture after pulling on her purse that was stuck on a doorknob.  She was initially seen in the ER and subsequently referred to my office.  She presents today for surgical management of this fracture.  Patient denies any changes to their medical history or new systemic symptoms today.    Past Medical History:  Diagnosis Date   History of bilateral tubal ligation 05/31/2019   History of cesarean section 03/03/2016   For severe Preeclampsia. Did not labor.    History of preterm delivery 03/03/2016   For severe Preeclampsia. Did not labor.    Hypertension    Iron  deficiency anemia due to chronic blood loss 08/21/2018   Severe preeclampsia 2015   Severe preeclampsia 05/31/2019   Past Surgical History:  Procedure Laterality Date   CESAREAN SECTION  2015   CESAREAN SECTION N/A 10/20/2017   Procedure: CESAREAN SECTION;  Surgeon: Jayne Vonn DEL, MD;  Location: [facility];  Service: Obstetrics;  Laterality: N/A;   CESAREAN SECTION N/A 05/31/2019   Procedure: REPEAT CESAREAN SECTION WITH BILATERAL TUBAL LIGATION;  Surgeon: Delana Ted Morrison, DO;  Location: [facility] OR[facility];  Service: Obstetrics;  Laterality: N/A;   EYE SURGERY     HERNIA REPAIR     Social History   Socioeconomic History   Marital status: Significant Other    Spouse name: Not on file   Number of children: Not on file   Years of education: Not on file   Highest education level: Not on file  Occupational History    Comment: retail store - [employer]  Tobacco Use   Smoking status: Never   Smokeless tobacco: Never  Vaping Use   Vaping status: Never Used  Substance and Sexual Activity   Alcohol use: Not Currently    Comment: not while pregnant   Drug use: No   Sexual activity: Yes    Birth control/protection: None  Other Topics Concern   Not on file  Social History Narrative   Not on file    Social Drivers of Health   Financial Resource Strain: Low Risk  (08/21/2018)   Overall Financial Resource Strain (CARDIA)    Difficulty of Paying Living Expenses: Not hard at all  Food Insecurity: No Food Insecurity (08/01/2023)   Hunger Vital Sign    Worried About Running Out of Food in the Last Year: Never true    Ran Out of Food in the Last Year: Never true  Transportation Needs: No Transportation Needs (08/01/2023)   PRAPARE - Administrator, Civil Service (Medical): No    Lack of Transportation (Non-Medical): No  Physical Activity: Sufficiently Active (08/21/2018)   Exercise Vital Sign    Days of Exercise per Week: 7 days    Minutes of Exercise per Session: 30 min  Stress: No Stress Concern Present (08/21/2018)   [employer] of Occupational Health - Occupational Stress Questionnaire    Feeling of Stress : Not at all  Social Connections: Unknown (12/17/2021)   Received from [facility]   Social Network    Social Network: Not on file   Family History  Problem Relation Age of Onset   Hypertension Father    Hypertension Maternal Grandmother    Hypertension Paternal Grandmother    - negative except otherwise stated in the family history section Allergies  Allergen Reactions   Food Swelling and  Other (See Comments)    Peanut butter causes tongue swelling    Prior to Admission medications   Medication Sig Start Date End Date Taking? Authorizing Provider  acetaminophen  (TYLENOL ) 500 MG tablet Take 1 tablet (500 mg total) by mouth every 6 (six) hours as needed. 08/31/23  Yes Charlyn Sora, MD  amLODipine  (NORVASC ) 5 MG tablet Take 1 tablet (5 mg total) by mouth daily. 08/04/23  Yes Vann, Jessica U, DO  ferrous sulfate  (FERROUSUL) 325 (65 FE) MG tablet Take 1 tablet (325 mg total) by mouth daily with breakfast. 08/04/23  Yes Vann, Jessica U, DO  ibuprofen  (ADVIL ) 800 MG tablet Take 1 tablet (800 mg total) by mouth every 8 (eight) hours as  needed. 06/03/19  Yes Bovard-Stuckert, Jody, MD  metoprolol  tartrate (LOPRESSOR ) 25 MG tablet Take 25 mg by mouth 2 (two) times daily. 08/21/23  Yes [provider]  oxyCODONE -acetaminophen  (PERCOCET/ROXICET) 5-325 MG tablet Take 1 tablet by mouth every 8 (eight) hours as needed for severe pain (pain score 7-10). 08/31/23  Yes Charlyn Sora, MD  Pediatric Multiple Vit-C-FA (FLINSTONES GUMMIES OMEGA-3 DHA) CHEW Chew 1 tablet by mouth daily. 06/03/19  Yes Bovard-Stuckert, Jody, MD  spironolactone  (ALDACTONE ) 25 MG tablet Take 1 tablet (25 mg total) by mouth daily. 08/04/23  Yes Vann, Jessica U, DO  Vitamin D , Ergocalciferol , (DRISDOL ) 1.25 MG (50000 UNIT) CAPS capsule Take 1 capsule (50,000 Units total) by mouth every 7 (seven) days. 08/09/23  Yes Vann, Jessica U, DO   DG MINI C-ARM IMAGE ONLY Result Date: 09/11/2023 There is no interpretation for this exam.  This order is for images obtained during a surgical procedure.  Please See Surgeries Tab for more information regarding the procedure.   - Positive ROS: All other systems have been reviewed and were otherwise negative with the exception of those mentioned in the HPI and as above.  Physical Exam: General: No acute distress, resting comfortably Cardiovascular: BUE warm and well perfused, normal rate Respiratory: Normal WOB on RA Skin: Warm and dry Neurologic: Sensation intact distally Psychiatric: Patient is at baseline mood and affect  Right upper Extremity  Coaptation splint is clean and dry.  She has flexion range of motion of her wrist and fingers.  Sensation is intact light touch in the median, ulnar, and radial nerve distributions.  Her hand is warm and well-perfused with brisk capillary refill.  Assessment: 28 year old female with a closed, right, distal third humeral shaft fracture.  Plan: OR today for open reduction and internal fixation of the right distal humerus fracture. We again reviewed the risks of surgery which  include bleeding, infection, damage to the radial nerve or other neurovascular structures, persistent symptoms, nonunion, malunion, elbow stiffness, delayed wound healing, need for additional surgery.  Informed consent was signed.  All questions were answered.   Bebe Galla, M.D. [facility] 3:26 PM

## 2023-09-11 NOTE — Discharge Instructions (Signed)
 Eileen Miller, M.D. Hand Surgery  POST-OPERATIVE DISCHARGE INSTRUCTIONS   PRESCRIPTIONS: You may have been given a prescription to be taken as directed for post-operative pain control.  You may also take over the counter ibuprofen /aleve and tylenol  for pain. Take this as directed on the packaging. Do not exceed 3000 mg tylenol /acetaminophen  in 24 hours.  Ibuprofen  600-800 mg (3-4) tablets by mouth every 6 hours as needed for pain.  OR Aleve 2 tablets by mouth every 12 hours (twice daily) as needed for pain.  AND/OR Tylenol  1000 mg (2 tablets) every 8 hours as needed for pain.  Please use your pain medication carefully, as refills are limited and you may not be provided with one.  As stated above, please use over the counter pain medicine - it will also be helpful with decreasing your swelling.    ANESTHESIA: After your surgery, post-surgical discomfort or pain is likely. This discomfort can last several days to a few weeks. At certain times of the day your discomfort may be more intense.   Did you receive a nerve block?  A nerve block can provide pain relief for one hour to two days after your surgery. As long as the nerve block is working, you will experience little or no sensation in the area the surgeon operated on.  As the nerve block wears off, you will begin to experience pain or discomfort. It is very important that you begin taking your prescribed pain medication before the nerve block fully wears off. Treating your pain at the first sign of the block wearing off will ensure your pain is better controlled and more tolerable when full-sensation returns. Do not wait until the pain is intolerable, as the medicine will be less effective. It is better to treat pain in advance than to try and catch up.   General Anesthesia:  If you did not receive a nerve block during your surgery, you will need to start taking your pain medication shortly after your surgery and should continue  to do so as prescribed by your surgeon.     ICE AND ELEVATION: You may use ice for the first 48-72 hours, but it is not critical.   Motion of your fingers is very important to decrease the swelling.  Elevation, as much as possible for the next 48 hours, is critical for decreasing swelling as well as for pain relief. Elevation means when you are seated or lying down, you hand should be at or above your heart. When walking, the hand needs to be at or above the level of your elbow.  If the bandage gets too tight, it may need to be loosened. Please contact our office and we will instruct you in how to do this.    SURGICAL BANDAGES:  Keep your dressing and/or splint clean and dry at all times.  Do not remove until you are seen again in the office.  If careful, you may place a plastic bag over your bandage and tape the end to shower, but be careful, do not get your bandages wet.     HAND THERAPY:  You will be contacted regarding the date and time of your first therapy visit.    ACTIVITY AND WORK: You are encouraged to move any fingers which are not in the bandage.  Light use of the fingers is allowed to assist the other hand with daily hygiene and eating, but strong gripping or lifting is often uncomfortable and should be avoided.  You might  miss a variable period of time from work and hopefully this issue has been discussed prior to surgery. You may not do any heavy work with your affected hand for about 2 weeks.    [facility] Second Floor, [address] [place], [place] [address] [phone]

## 2023-09-11 NOTE — Anesthesia Preprocedure Evaluation (Addendum)
 Anesthesia Evaluation  Patient identified by MRN, date of birth, ID band Patient awake    Reviewed: Allergy & Precautions, NPO status , Patient's Chart, lab work & pertinent test results  Airway Mallampati: III  TM Distance: >3 FB Neck ROM: Full    Dental  (+) Dental Advisory Given   Pulmonary    breath sounds clear to auscultation       Cardiovascular hypertension,  Rhythm:Regular     Neuro/Psych negative neurological ROS  negative psych ROS   GI/Hepatic negative GI ROS, Neg liver ROS,,,  Endo/Other  negative endocrine ROS    Renal/GU Lab Results      Component                Value               Date                      NA                       139                 08/08/2023                K                        3.8                 08/08/2023                CO2                      28                  08/08/2023                GLUCOSE                  92                  08/08/2023                BUN                      13                  08/08/2023                CREATININE               0.74                08/08/2023                CALCIUM                   9.6                 08/08/2023                GFRNONAA                 >60                 08/08/2023                Musculoskeletal : Right distal humerus fracture   Abdominal  Peds  Hematology  (+) Blood dyscrasia, anemia Lab Results      Component                Value               Date                      WBC                      6.3                 09/11/2023                HGB                      8.8 (L)             09/11/2023                HCT                      29.7 (L)            09/11/2023                MCV                      71.9 (L)            09/11/2023                PLT                      455 (H)             09/11/2023              Anesthesia Other Findings   Reproductive/Obstetrics Lab Results      Component                 Value               Date                      PREGTESTUR               NEGATIVE            09/11/2023                PREGSERUM                NEGATIVE            08/01/2023                HCG                      <5.0                04/19/2022                                        Anesthesia Physical Anesthesia Plan  ASA: 2  Anesthesia Plan: Regional and General   Post-op Pain Management: Regional block*   Induction: Intravenous  PONV Risk Score and Plan: 2 and Propofol  infusion, Treatment may vary due to age or  medical condition and Ondansetron   Airway Management Planned: Oral ETT  Additional Equipment: None  Intra-op Plan:   Post-operative Plan: Extubation in OR  Informed Consent: I have reviewed the patients History and Physical, chart, labs and discussed the procedure including the risks, benefits and alternatives for the proposed anesthesia with the patient or authorized representative who has indicated his/her understanding and acceptance.     Dental advisory given  Plan Discussed with: CRNA  Anesthesia Plan Comments:         Anesthesia Quick Evaluation

## 2023-09-13 ENCOUNTER — Ambulatory Visit: Payer: Medicaid Other

## 2023-09-16 ENCOUNTER — Encounter (HOSPITAL_COMMUNITY): Payer: Self-pay | Admitting: Orthopedic Surgery

## 2023-10-20 NOTE — Progress Notes (Deleted)
 Cardiology Office Note:    Date:  10/20/2023   ID:  Eileen Miller, DOB 12-01-1995, MRN [id number]  PCP:  Lavina Hamman, MD   [facility] Providers Cardiologist:  None { Click to update primary MD,subspecialty MD or APP then REFRESH:1}    Referring MD: Norm Salt, PA   No chief complaint on file. ***  History of Present Illness:    Eileen Miller is a 28 y.o. female is seen at the request of Norva Riffle PA for evaluation of palpitations.  Past Medical History:  Diagnosis Date   History of bilateral tubal ligation 05/31/2019   History of cesarean section 03/03/2016   For severe Preeclampsia. Did not labor.    History of preterm delivery 03/03/2016   For severe Preeclampsia. Did not labor.    Hypertension    Iron deficiency anemia due to chronic blood loss 08/21/2018   Severe preeclampsia 2015   Severe preeclampsia 05/31/2019    Past Surgical History:  Procedure Laterality Date   CESAREAN SECTION  2015   CESAREAN SECTION N/A 10/20/2017   Procedure: CESAREAN SECTION;  Surgeon: Lazaro Arms, MD;  Location: [facility] BIRTHING SUITES;  Service: Obstetrics;  Laterality: N/A;   CESAREAN SECTION N/A 05/31/2019   Procedure: REPEAT CESAREAN SECTION WITH BILATERAL TUBAL LIGATION;  Surgeon: Edwinna Areola, DO;  Location: [facility] LD ORS;  Service: Obstetrics;  Laterality: N/A;   EYE SURGERY     HERNIA REPAIR     ORIF HUMERUS FRACTURE Right 09/11/2023   Procedure: OPEN REDUCTION INTERNAL FIXATION (ORIF)DISTAL  HUMERUS FRACTURE;  Surgeon: Marlyne Beards, MD;  Location: [facility] OR;  Service: Orthopedics;  Laterality: Right;  with regional block, he wants this case at 3:45 and then irrigation to follow    Current Medications: No outpatient medications have been marked as taking for the 10/25/23 encounter (Appointment) with [place], Akira Perusse M, MD.     Allergies:   Food   Social History   Socioeconomic History   Marital status: Significant Other    Spouse name: Not on  file   Number of children: Not on file   Years of education: Not on file   Highest education level: Not on file  Occupational History    Comment: retail store - [employer]  Tobacco Use   Smoking status: Never   Smokeless tobacco: Never  Vaping Use   Vaping status: Never Used  Substance and Sexual Activity   Alcohol use: Not Currently    Comment: not while pregnant   Drug use: No   Sexual activity: Yes    Birth control/protection: None  Other Topics Concern   Not on file  Social History Narrative   Not on file   Social Drivers of Health   Financial Resource Strain: Low Risk  (08/21/2018)   Overall Financial Resource Strain (CARDIA)    Difficulty of Paying Living Expenses: Not hard at all  Food Insecurity: No Food Insecurity (08/01/2023)   Hunger Vital Sign    Worried About Running Out of Food in the Last Year: Never true    Ran Out of Food in the Last Year: Never true  Transportation Needs: No Transportation Needs (08/01/2023)   PRAPARE - Administrator, Civil Service (Medical): No    Lack of Transportation (Non-Medical): No  Physical Activity: Sufficiently Active (08/21/2018)   Exercise Vital Sign    Days of Exercise per Week: 7 days    Minutes of Exercise per Session: 30 min  Stress: No Stress  Concern Present (08/21/2018)   [employer] of Occupational Health - Occupational Stress Questionnaire    Feeling of Stress : Not at all  Social Connections: Unknown (12/17/2021)   Received from [facility]   Social Network    Social Network: Not on file     Family History: The patient's ***family history includes Hypertension in her father, maternal grandmother, and paternal grandmother.  ROS:   Please see the history of present illness.    *** All other systems reviewed and are negative.  EKGs/Labs/Other Studies Reviewed:    The following studies were reviewed today: ***      Recent Labs: 08/01/2023: TSH 0.555 08/02/2023:  Magnesium 1.9 08/08/2023: ALT 10 09/11/2023: BUN 12; Creatinine, Ser 0.71; Hemoglobin 8.8; Platelets 455; Potassium 3.0; Sodium 138  Recent Lipid Panel No results found for: "CHOL", "TRIG", "HDL", "CHOLHDL", "VLDL", "LDLCALC", "LDLDIRECT"   Risk Assessment/Calculations:   {Does this patient have ATRIAL FIBRILLATION?:[id number]}  No BP recorded.  {Refresh Note OR Click here to enter BP  :1}***         Physical Exam:    VS:  There were no vitals taken for this visit.    Wt Readings from Last 3 Encounters:  09/11/23 195 lb (88.5 kg)  08/31/23 193 lb (87.5 kg)  08/08/23 193 lb 9.6 oz (87.8 kg)     GEN: *** Well nourished, well developed in no acute distress HEENT: Normal NECK: No JVD; No carotid bruits LYMPHATICS: No lymphadenopathy CARDIAC: ***RRR, no murmurs, rubs, gallops RESPIRATORY:  Clear to auscultation without rales, wheezing or rhonchi  ABDOMEN: Soft, non-tender, non-distended MUSCULOSKELETAL:  No edema; No deformity  SKIN: Warm and dry NEUROLOGIC:  Alert and oriented x 3 PSYCHIATRIC:  Normal affect   ASSESSMENT:    No diagnosis found. PLAN:    In order of problems listed above:  ***      {Are you ordering a CV Procedure (e.g. stress test, cath, DCCV, TEE, etc)?   Press F2        :[id number]}    Medication Adjustments/Labs and Tests Ordered: Current medicines are reviewed at length with the patient today.  Concerns regarding medicines are outlined above.  No orders of the defined types were placed in this encounter.  No orders of the defined types were placed in this encounter.   There are no Patient Instructions on file for this visit.   Signed, Kalya Troeger [place], MD  10/20/2023 2:09 PM    [facility]

## 2023-10-25 ENCOUNTER — Ambulatory Visit: Payer: Medicaid Other | Admitting: Cardiology

## 2023-10-28 ENCOUNTER — Encounter: Payer: Self-pay | Admitting: Cardiology

## 2023-11-06 ENCOUNTER — Other Ambulatory Visit: Payer: Self-pay | Admitting: Oncology

## 2023-11-06 ENCOUNTER — Inpatient Hospital Stay: Payer: Medicaid Other | Admitting: Oncology

## 2023-11-06 ENCOUNTER — Telehealth: Payer: Self-pay

## 2023-11-06 ENCOUNTER — Inpatient Hospital Stay: Payer: Medicaid Other | Attending: Oncology

## 2023-11-06 DIAGNOSIS — D500 Iron deficiency anemia secondary to blood loss (chronic): Secondary | ICD-10-CM

## 2023-11-06 NOTE — Telephone Encounter (Signed)
 Followed up with patient regarding missed appt this morning with Dr. Arlana Pouch but there was not answer. I did leave a VM regarding the missed appt along with phone number to call to reschedule. Scheduler, Jasmine December, made aware also to follow up to reschedule.

## 2023-12-13 ENCOUNTER — Other Ambulatory Visit: Payer: Self-pay | Admitting: Obstetrics and Gynecology

## 2023-12-13 DIAGNOSIS — E049 Nontoxic goiter, unspecified: Secondary | ICD-10-CM

## 2024-01-10 ENCOUNTER — Other Ambulatory Visit: Payer: Self-pay | Admitting: Physician Assistant

## 2024-01-10 DIAGNOSIS — R102 Pelvic and perineal pain: Secondary | ICD-10-CM

## 2024-01-14 ENCOUNTER — Ambulatory Visit
Admission: RE | Admit: 2024-01-14 | Discharge: 2024-01-14 | Disposition: A | Discharge status: Home / Self Care | Source: Ambulatory Visit | Attending: Physician Assistant | Admitting: Physician Assistant

## 2024-01-14 ENCOUNTER — Ambulatory Visit
Admission: RE | Admit: 2024-01-14 | Discharge: 2024-01-14 | Disposition: A | Discharge status: Home / Self Care | Source: Ambulatory Visit | Attending: Obstetrics and Gynecology | Admitting: Obstetrics and Gynecology

## 2024-01-14 DIAGNOSIS — R102 Pelvic and perineal pain: Secondary | ICD-10-CM

## 2024-01-14 DIAGNOSIS — E049 Nontoxic goiter, unspecified: Secondary | ICD-10-CM

## 2024-01-20 ENCOUNTER — Other Ambulatory Visit: Payer: Self-pay | Admitting: Obstetrics and Gynecology

## 2024-01-20 DIAGNOSIS — R9389 Abnormal findings on diagnostic imaging of other specified body structures: Secondary | ICD-10-CM

## 2024-01-30 ENCOUNTER — Ambulatory Visit: Attending: Internal Medicine | Admitting: Internal Medicine

## 2024-01-31 ENCOUNTER — Encounter: Payer: Self-pay | Admitting: Internal Medicine

## 2024-08-24 ENCOUNTER — Ambulatory Visit: Admitting: Endocrinology

## 2024-10-07 ENCOUNTER — Ambulatory Visit: Admitting: Endocrinology
